# Patient Record
Sex: Female | Born: 1955
Health system: Southern US, Community
[De-identification: ages and names within clinical notes are randomized; demographics above are authoritative.]

## PROBLEM LIST (undated history)

## (undated) DIAGNOSIS — F419 Anxiety disorder, unspecified: Secondary | ICD-10-CM

## (undated) DIAGNOSIS — E785 Hyperlipidemia, unspecified: Secondary | ICD-10-CM

## (undated) DIAGNOSIS — I10 Essential (primary) hypertension: Secondary | ICD-10-CM

## (undated) DIAGNOSIS — K219 Gastro-esophageal reflux disease without esophagitis: Secondary | ICD-10-CM

## (undated) DIAGNOSIS — M1990 Unspecified osteoarthritis, unspecified site: Secondary | ICD-10-CM

## (undated) DIAGNOSIS — E119 Type 2 diabetes mellitus without complications: Secondary | ICD-10-CM

## (undated) HISTORY — DX: Hyperlipidemia, unspecified: E78.5

## (undated) HISTORY — PX: CERVICAL FUSION: SHX112

## (undated) HISTORY — DX: Essential (primary) hypertension: I10

## (undated) HISTORY — DX: Gastro-esophageal reflux disease without esophagitis: K21.9

## (undated) HISTORY — PX: KNEE ARTHROSCOPY: SUR90

## (undated) HISTORY — DX: Unspecified osteoarthritis, unspecified site: M19.90

## (undated) HISTORY — PX: LUMBAR FUSION: SHX111

## (undated) HISTORY — DX: Anxiety disorder, unspecified: F41.9

## (undated) HISTORY — PX: ABDOMINAL HYSTERECTOMY: SHX81

## (undated) HISTORY — DX: Type 2 diabetes mellitus without complications: E11.9

---

## 1998-06-14 ENCOUNTER — Encounter: Payer: Self-pay | Admitting: Neurosurgery

## 1998-06-14 ENCOUNTER — Ambulatory Visit (HOSPITAL_COMMUNITY): Admission: RE | Admit: 1998-06-14 | Discharge: 1998-06-14 | Payer: Self-pay | Admitting: Neurosurgery

## 1998-06-18 ENCOUNTER — Ambulatory Visit (HOSPITAL_COMMUNITY): Admission: RE | Admit: 1998-06-18 | Discharge: 1998-06-18 | Payer: Self-pay | Admitting: Neurosurgery

## 1998-06-25 ENCOUNTER — Encounter: Payer: Self-pay | Admitting: Neurosurgery

## 1998-06-25 ENCOUNTER — Ambulatory Visit (HOSPITAL_COMMUNITY): Admission: RE | Admit: 1998-06-25 | Discharge: 1998-06-25 | Payer: Self-pay | Admitting: Neurosurgery

## 1998-07-09 ENCOUNTER — Encounter: Payer: Self-pay | Admitting: Neurosurgery

## 1998-07-09 ENCOUNTER — Ambulatory Visit (HOSPITAL_COMMUNITY): Admission: RE | Admit: 1998-07-09 | Discharge: 1998-07-09 | Payer: Self-pay | Admitting: Neurosurgery

## 1998-07-23 ENCOUNTER — Ambulatory Visit (HOSPITAL_COMMUNITY): Admission: RE | Admit: 1998-07-23 | Discharge: 1998-07-23 | Payer: Self-pay | Admitting: Neurosurgery

## 1998-07-23 ENCOUNTER — Encounter: Payer: Self-pay | Admitting: Neurosurgery

## 1999-03-06 ENCOUNTER — Encounter: Payer: Self-pay | Admitting: Emergency Medicine

## 1999-03-06 ENCOUNTER — Emergency Department (HOSPITAL_COMMUNITY): Admission: EM | Admit: 1999-03-06 | Discharge: 1999-03-06 | Payer: Self-pay | Admitting: Emergency Medicine

## 2000-02-28 ENCOUNTER — Encounter: Payer: Self-pay | Admitting: Emergency Medicine

## 2000-02-28 ENCOUNTER — Emergency Department (HOSPITAL_COMMUNITY): Admission: EM | Admit: 2000-02-28 | Discharge: 2000-02-28 | Payer: Self-pay

## 2000-03-15 ENCOUNTER — Encounter: Payer: Self-pay | Admitting: Neurosurgery

## 2000-03-15 ENCOUNTER — Ambulatory Visit (HOSPITAL_COMMUNITY): Admission: RE | Admit: 2000-03-15 | Discharge: 2000-03-15 | Payer: Self-pay | Admitting: Neurosurgery

## 2000-04-13 ENCOUNTER — Ambulatory Visit (HOSPITAL_COMMUNITY): Admission: RE | Admit: 2000-04-13 | Discharge: 2000-04-14 | Payer: Self-pay | Admitting: Neurosurgery

## 2000-04-13 ENCOUNTER — Encounter: Payer: Self-pay | Admitting: Neurosurgery

## 2000-06-14 ENCOUNTER — Encounter: Admission: RE | Admit: 2000-06-14 | Discharge: 2000-07-10 | Payer: Self-pay | Admitting: Neurosurgery

## 2000-06-21 ENCOUNTER — Encounter: Admission: RE | Admit: 2000-06-21 | Discharge: 2000-07-10 | Payer: Self-pay | Admitting: Orthopedic Surgery

## 2000-07-21 ENCOUNTER — Encounter: Payer: Self-pay | Admitting: Neurosurgery

## 2000-07-21 ENCOUNTER — Ambulatory Visit (HOSPITAL_COMMUNITY): Admission: RE | Admit: 2000-07-21 | Discharge: 2000-07-21 | Payer: Self-pay | Admitting: Neurosurgery

## 2000-08-10 ENCOUNTER — Encounter: Payer: Self-pay | Admitting: Neurosurgery

## 2000-08-10 ENCOUNTER — Ambulatory Visit (HOSPITAL_COMMUNITY): Admission: RE | Admit: 2000-08-10 | Discharge: 2000-08-10 | Payer: Self-pay | Admitting: Neurosurgery

## 2000-08-24 ENCOUNTER — Encounter: Payer: Self-pay | Admitting: Neurosurgery

## 2000-08-24 ENCOUNTER — Ambulatory Visit (HOSPITAL_COMMUNITY): Admission: RE | Admit: 2000-08-24 | Discharge: 2000-08-24 | Payer: Self-pay | Admitting: Neurosurgery

## 2000-09-06 ENCOUNTER — Encounter: Payer: Self-pay | Admitting: Neurosurgery

## 2000-09-06 ENCOUNTER — Encounter: Admission: RE | Admit: 2000-09-06 | Discharge: 2000-09-06 | Payer: Self-pay | Admitting: Neurosurgery

## 2000-09-07 ENCOUNTER — Encounter: Admission: RE | Admit: 2000-09-07 | Discharge: 2000-09-07 | Payer: Self-pay | Admitting: Neurosurgery

## 2000-09-07 ENCOUNTER — Encounter: Payer: Self-pay | Admitting: Neurosurgery

## 2000-11-13 ENCOUNTER — Ambulatory Visit (HOSPITAL_COMMUNITY): Admission: RE | Admit: 2000-11-13 | Discharge: 2000-11-13 | Payer: Self-pay | Admitting: Neurosurgery

## 2000-11-16 ENCOUNTER — Inpatient Hospital Stay (HOSPITAL_COMMUNITY): Admission: RE | Admit: 2000-11-16 | Discharge: 2000-11-18 | Payer: Self-pay | Admitting: Neurosurgery

## 2000-11-16 ENCOUNTER — Encounter: Payer: Self-pay | Admitting: Neurosurgery

## 2000-12-25 ENCOUNTER — Inpatient Hospital Stay (HOSPITAL_COMMUNITY): Admission: AD | Admit: 2000-12-25 | Discharge: 2000-12-29 | Payer: Self-pay | Admitting: Neurosurgery

## 2000-12-25 ENCOUNTER — Encounter: Payer: Self-pay | Admitting: Neurosurgery

## 2000-12-28 ENCOUNTER — Encounter: Payer: Self-pay | Admitting: Neurosurgery

## 2001-01-04 ENCOUNTER — Encounter: Admission: RE | Admit: 2001-01-04 | Discharge: 2001-01-04 | Payer: Self-pay | Admitting: Neurosurgery

## 2001-01-04 ENCOUNTER — Encounter: Payer: Self-pay | Admitting: Neurosurgery

## 2001-02-08 ENCOUNTER — Encounter: Admission: RE | Admit: 2001-02-08 | Discharge: 2001-02-08 | Payer: Self-pay | Admitting: Neurosurgery

## 2001-02-08 ENCOUNTER — Encounter: Payer: Self-pay | Admitting: Neurosurgery

## 2001-03-07 ENCOUNTER — Encounter: Payer: Self-pay | Admitting: Neurosurgery

## 2001-03-07 ENCOUNTER — Encounter: Admission: RE | Admit: 2001-03-07 | Discharge: 2001-03-07 | Payer: Self-pay | Admitting: Neurosurgery

## 2001-04-18 ENCOUNTER — Encounter: Admission: RE | Admit: 2001-04-18 | Discharge: 2001-04-18 | Payer: Self-pay | Admitting: Neurosurgery

## 2001-04-18 ENCOUNTER — Encounter: Payer: Self-pay | Admitting: Neurosurgery

## 2001-04-19 ENCOUNTER — Encounter: Payer: Self-pay | Admitting: Neurosurgery

## 2001-04-19 ENCOUNTER — Ambulatory Visit (HOSPITAL_COMMUNITY): Admission: RE | Admit: 2001-04-19 | Discharge: 2001-04-19 | Payer: Self-pay | Admitting: Neurosurgery

## 2001-04-26 ENCOUNTER — Encounter: Admission: RE | Admit: 2001-04-26 | Discharge: 2001-04-26 | Payer: Self-pay | Admitting: Neurosurgery

## 2001-04-26 ENCOUNTER — Encounter: Payer: Self-pay | Admitting: Neurosurgery

## 2001-05-17 ENCOUNTER — Encounter: Payer: Self-pay | Admitting: Neurosurgery

## 2001-05-17 ENCOUNTER — Encounter: Admission: RE | Admit: 2001-05-17 | Discharge: 2001-05-17 | Payer: Self-pay | Admitting: Neurosurgery

## 2001-07-19 ENCOUNTER — Encounter: Payer: Self-pay | Admitting: Unknown Physician Specialty

## 2001-07-19 ENCOUNTER — Encounter: Admission: RE | Admit: 2001-07-19 | Discharge: 2001-07-19 | Payer: Self-pay | Admitting: Unknown Physician Specialty

## 2001-12-09 ENCOUNTER — Ambulatory Visit (HOSPITAL_COMMUNITY): Admission: RE | Admit: 2001-12-09 | Discharge: 2001-12-09 | Payer: Self-pay | Admitting: Cardiology

## 2001-12-09 ENCOUNTER — Encounter: Payer: Self-pay | Admitting: Cardiology

## 2002-04-18 ENCOUNTER — Other Ambulatory Visit: Admission: RE | Admit: 2002-04-18 | Discharge: 2002-04-18 | Payer: Self-pay | Admitting: *Deleted

## 2002-04-29 ENCOUNTER — Ambulatory Visit (HOSPITAL_COMMUNITY): Admission: RE | Admit: 2002-04-29 | Discharge: 2002-04-29 | Payer: Self-pay | Admitting: Cardiology

## 2003-02-04 ENCOUNTER — Encounter: Payer: Self-pay | Admitting: Gastroenterology

## 2003-02-04 ENCOUNTER — Ambulatory Visit (HOSPITAL_COMMUNITY): Admission: RE | Admit: 2003-02-04 | Discharge: 2003-02-04 | Payer: Self-pay | Admitting: Gastroenterology

## 2004-04-07 ENCOUNTER — Ambulatory Visit (HOSPITAL_COMMUNITY): Admission: RE | Admit: 2004-04-07 | Discharge: 2004-04-07 | Payer: Self-pay | Admitting: Neurosurgery

## 2004-05-10 ENCOUNTER — Ambulatory Visit (HOSPITAL_COMMUNITY): Admission: RE | Admit: 2004-05-10 | Discharge: 2004-05-11 | Payer: Self-pay | Admitting: Neurosurgery

## 2005-04-05 ENCOUNTER — Ambulatory Visit (HOSPITAL_COMMUNITY): Admission: RE | Admit: 2005-04-05 | Discharge: 2005-04-05 | Payer: Self-pay | Admitting: Cardiology

## 2006-02-27 ENCOUNTER — Ambulatory Visit (HOSPITAL_COMMUNITY): Admission: RE | Admit: 2006-02-27 | Discharge: 2006-02-27 | Payer: Self-pay | Admitting: Neurosurgery

## 2006-04-06 ENCOUNTER — Encounter: Admission: RE | Admit: 2006-04-06 | Discharge: 2006-04-06 | Payer: Self-pay | Admitting: Neurosurgery

## 2006-05-15 ENCOUNTER — Ambulatory Visit (HOSPITAL_COMMUNITY): Admission: RE | Admit: 2006-05-15 | Discharge: 2006-05-16 | Payer: Self-pay | Admitting: Neurosurgery

## 2006-08-27 ENCOUNTER — Ambulatory Visit (HOSPITAL_COMMUNITY): Admission: RE | Admit: 2006-08-27 | Discharge: 2006-08-27 | Payer: Self-pay | Admitting: Neurosurgery

## 2006-11-07 ENCOUNTER — Ambulatory Visit (HOSPITAL_COMMUNITY): Admission: RE | Admit: 2006-11-07 | Discharge: 2006-11-07 | Payer: Self-pay | Admitting: Cardiology

## 2007-06-12 ENCOUNTER — Encounter: Admission: RE | Admit: 2007-06-12 | Discharge: 2007-06-12 | Payer: Self-pay | Admitting: Cardiology

## 2010-10-21 NOTE — Op Note (Signed)
[place]. [facility]  Patient:    [name], [name]                        MRN: [id number] Proc. Date: 12/25/00 Adm. Date:  [id number] Disc. Date: [id number] Attending:  Danella Penton                           Operative Report  PREOPERATIVE DIAGNOSIS:   Lower back pain, chronic with bilateral L5-S1 radiculopathy, degenerative disk disease L5-S1.  POSTOPERATIVE DIAGNOSIS:  Lower back pain, chronic with bilateral L5-S1 radiculopathy, degenerative disk disease L5-S1.  OPERATION:  Bilateral L5-S1 diskectomy, interbody fusion using Tangent bone graft.  Posterior lateral fusion L5-S1.  Pedicle screw L5-S1.  SURGEON:  Tanya Nones. Jeral Fruit, M.D.  ASSISTANTMena Goes. Franky Macho, M.D.  INDICATIONS:  Ms. Castonguay is a lady who underwent L5-S1 diskectomy several months ago.  The patient, nevertheless although she did well for awhile, continued to have back pain with radiation down to both legs.  Conservative treatment had been a failure.  X-rays showed that indeed she has abnormal disk space between L5-S1 with bilateral foraminal stenosis which is giving her the problem.  Surgery was advised in view of the failure of conservative treatment.  The patient was to have surgery several weeks ago but she came with an acute herniated disk in the cervical area which had to be taken care surgically first.  The patient knew of the risks of surgery.  DESCRIPTION OF PROCEDURE:  The patient was taken to the operating room.  She was positioned in a prone position.  A midline incision resecting the previous one was made.  Muscle and fascia were retracted laterally.  We identified the area of surgery at the L5-S1 and it was easy to identify the L5 on the right side.  Retractor was inserted.  We decided to preserve the spinal stenosis. With the drill, we drilled the lamina of L5 and the upper part of S1 bilaterally.  There was quite a bit of scar tissue on the left side affecting not  only the S1 but also the L5 nerve root.  Lysis was accomplished, and good foraminotomy was done.  We went laterally until we were able to remove a little bit of the facet.  Then we identified the disk space.  There was quite a bit of scar between the dural sac and the disk space.  Lysis was accomplished, and we entered the disk space.  Using the microcurette we were able to do a total diskectomy.  The same procedure was done on the right side. Then we inserted the retractor at the level of L5-S1 and started with the 6 mm going all the way up to 10 mm.  We removed the end-plates bilaterally.  At the end, we were able to insert #26 allograft bilaterally.  In between both and the center of the disk we used a mixture of allograft and Grafton.  All of this procedure was done using the C-arm.  Then we went laterally until we identified the L5-S1 area.  We drilled the alla of the sacrum as well as the transverse process of L5.  Then we had a mixture of blood plus Vitross which was inserted and applied laterally at both levels, right and left.  Having done this, we identified the pedicles of L4 and pedicles of S1.  Using a probe, with the help of the  C-arm we followed the insertion of the screw at those four levels.  AP and lateral showed good position of the screws. Investigation showed that there was plenty of space for the L5 and S1 nerve root.  Then a rod was inserted between the pedicle screws and compression was achieved.  Then we had a good picture of the AP and lateral.  Having done this, the area was irrigated.  Fentanyl was left in the epidural space.  Flap was also used to cover the dura mater.  From then on, the wound was closed with Vicryl and nylon.  The patient did well. DD:  12/25/00 TD:  12/26/00 Job: [id number]

## 2010-10-21 NOTE — H&P (Signed)
[place]. [facility]  Patient:    [name], [name]                        MRN: [id number] Adm. Date:  [id number] Disc. Date: [id number] Attending:  Danella Penton                         History and Physical  HISTORY OF PRESENT ILLNESS:  Mrs. Winbush is a lady who was admitted three days ago for lumbar fusion.  Nevertheless, when I was talking to her, she was telling me that for two weeks, she had been complaining of neck pain with radiation to the left upper extremity.  Because the pain in the arm was worse than the lower back, we decided to cancel the surgery until we could find out what was going on with the left arm.  Indeed, she is telling me that she has neck pain with radiation to the left arm, tingling sensation and weakness and she is miserable.  She feels that the left arm is getting worse, to the point that she cannot sleep at nighttime.  She had an MRI done at [facility] which was read as negative.  Nevertheless, I brought this lady to my office yesterday and I looked at the MRI and after my physical examination, I called the x-ray department of [facility] and indeed, they reviewed the MRI and they agreed with me that indeed she has a herniated disk at the level of C6-C7; this is the main reason why she is being admitted today for a cervical diskectomy.  PAST MEDICAL HISTORY:  Knee surgery, hysterectomy and she also has a lumbar disk surgery.  FAMILY HISTORY:  Unremarkable.  REVIEW OF SYSTEMS:  Patient has been having not only the neck and left upper extremity pain but also back and bilateral leg pain.  She has degenerative disk disease and she is being scheduled later on for a lumbar fusion.  PHYSICAL EXAMINATION:  GENERAL:  Patient came to my office yesterday and she was quite miserable with the pain in the back and also in the left arm.  The only way she was able to relieve the pain was putting the left hand on top of the  head.  HEENT:  Normal.  NECK:  She is able to flex but extension and lateralization are quite painful. There are no bruits in the carotids.  LUNGS:  Mild rhonchi.  HEART:  Heart sounds normal.  ABDOMEN:  Normal.  EXTREMITIES:  Normal pulses.  NEUROLOGIC:  Mental status normal.  Cranial nerves normal.  Strength:  She has weakness in dorsiflexors bilaterally, left worse than the right one.  In the left upper extremity, she has weakness of the left triceps.  The biceps is normal and so is the deltoid.  The right upper extremity is normal.  Reflexes are symmetrical with decrease of the left triceps; the lower extremities are 1+.  Sensation:  She complains of numbness which involves mostly the index and middle fingers and also numbness in the dorsum of the left foot.  Coordination and gait normal.  CLINICAL IMPRESSION: 1. Left C6-7 herniated disk. 2. Degenerative disk disease with a recurrent herniated disk at the level of    L4-5.  RECOMMENDATIONS:  The patient is going to have a posterior diskectomy and hopefully in two weeks, we can proceed with the lumbar fusion.  The risks associated with the cervical  diskectomy would be air emboli, infection, no improvement whatsoever, need for further surgery which might require an anterior approach, also the possibility of infection. DD:  11/16/00 TD:  11/16/00 Job: [id number]

## 2010-10-21 NOTE — Discharge Summary (Signed)
[place]. [facility]  Patient:    [name], [name]                        MRN: [id number] Adm. Date:  [id number] Disc. Date: [id number] Attending:  Danella Penton                           Discharge Summary  ADMITTING DIAGNOSIS:  Lumbar spondylosis and stenosis with lumbar radiculopathy L5-S1.  DISCHARGE DIAGNOSIS:  Lumbar spondylosis and stenosis with lumbar radiculopathy L5-S1.  OPERATION:  Bilateral L5-S1 diskectomy, foraminotomy, facetectomy, and interbody arthrodesis with pedicular screw fixation and posterolateral arthrodesis by Dr. Hilda Lias on December 25, 2000.  CONDITION ON DISCHARGE:  Improving.  HOSPITAL COURSE:  The patient is a 55 year old individual who has had significant back and bilateral leg pain secondary to a markedly degenerated disk at the L5-S1 level.  She failed extensive efforts at conservative management and was advised regarding surgical decompression and stabilization. This procedure was completed on December 25, 2000.  Postoperatively, the patient was slow to mobilize, required PCA medication for the first 48 hours postoperatively, gradually was mobilized and changed to oral Percocet.  Her pain has been well controlled, her incision has been clean and dry, and she has been proceeding with most activities of daily living fairly independently. At this time she is discharged home with a prescription for Percocet 5 #60 and Ativan 1 mg #30.  She will be seen in the office in three weeks time for further follow-up. DD:  12/29/00 TD:  12/31/00 Job: [id number]

## 2010-10-21 NOTE — H&P (Signed)
[place]. [facility]  Patient:    [name], [name]                        MRN: [id number] Adm. Date:  [id number] Attending:  Danella Penton                         History and Physical  HISTORY:  [name] is a lady who was seen by me initially more than two years ago because she was having back pain with radiation down to the left leg.  The patient has had conservative treatment.  She did well, but so far this year I have seen her on two occasions, because the pain was getting worse, with weakness, numbness, and limping from the left leg.  We did and MRI, and because of the findings, she is being admitted for surgery.  PAST SURGICAL HISTORY:  Hysterectomy.  ALLERGIES:  No known drug allergies.  SOCIAL HISTORY:  Does not smoke.  Does not drink.  FAMILY HISTORY:  Mother has congestive heart failure and arthritis.  REVIEW OF SYSTEMS:  She is complaining of some knee pain which she has before.  PHYSICAL EXAMINATION:  HEENT:  Normal.  NECK:  Normal.  LUNGS:  Clear.  HEART:  Sounds normal.  ABDOMEN:  Normal.  EXTREMITIES:  Normal pulses.  There is scar from previous surgery.  NEUROLOGIC:  Mental status normal.  Cranial nerves normal.  Strength:  She has weakness of plantar flexion, although the sensation is completely normal.  She complains of some tingling sensation, mostly in the left leg down to the left foot.  Straight leg raising on the right side is negative at 90 degrees, on the left is positive at 70 degrees.  She has decreased flexion in the lumbar flexion.  Reflexes 2+, with no Babinski.  The patient had previously a lumbar myelogram about one year ago which showed some degenerative changes at the level of L5-S1, but the repeat MRI which was done a few weeks ago showed that indeed she has a foraminal stenosis which is worse at the level of L5-S1, with the possibility of a lateral herniated disk. There are also some degenerative  changes at that level.  CLINICAL IMPRESSION: 1. Left L5-S1 foraminal stenosis with the possibility of a herniated disk. 2. Degenerative disk disease.  RECOMMENDATIONS:  The patient is being admitted for surgery.  She feels that she does not want to continue with the conservative treatment.  The procedure will be a left L5-S1 diskectomy and foraminotomy.  She knows about the risks, such as infection, CSF leak, worsening of the pain, paralysis, need for further surgery. DD:  04/13/00 TD:  04/13/00 Job: [id number]

## 2010-10-21 NOTE — Op Note (Signed)
[name], [name]                 ACCOUNT NO.:  [id number]   MEDICAL RECORD NO.:  [id number]          PATIENT TYPE:  OIB   LOCATION:  3008                         FACILITY:  [facility]   PHYSICIAN:  Hilda Lias, M.D.   DATE OF BIRTH:  February 09, 1956   DATE OF PROCEDURE:  05/10/2004  DATE OF DISCHARGE:                                 OPERATIVE REPORT   PREOPERATIVE DIAGNOSIS:  C6-C7 herniated disk, central to the left.   POSTOPERATIVE DIAGNOSIS:  C6-C7 herniated disk, central to the left.   OPERATION PERFORMED:  Anterior C6-C7 diskectomy.  Decompression of the  spinal cord, bilateral foraminotomy.  Removal of the large fragment to the  left side.  Interbody fusion and allograft, plate, microscope.   SURGEON:  Hilda Lias, M.D.   ANESTHESIA:  General.   INDICATIONS FOR PROCEDURE:  The patient is being admitted because of neck  and left upper extremity pain.  X-ray showed herniated disk central to the  left.  She has failed with conservative treatment. The risks were explained  in the history and physical.   DESCRIPTION OF PROCEDURE:  The patient was taken to the operating room and  the left side of the neck was prepped with Betadine.  Transverse incision  was made through the skin and platysma down to the cervical spine.  We found  large anterior osteophyte and the x-ray proved that was the level of the  left C6-C7.  The osteophyte was removed.  We brought the microscope into the  area.  Using the microcurette we did diskectomy with removal of degenerative  disk.  We opened the posterior ligament and to the left side we found a  large osteophyte with a free piece of fragment compromising the C7 nerve  root.  Decompression of the C7 nerve root bilateral was accomplished.  At  the end we had good decompression of not only the nerve but also the spinal  cord. The end plate was drilled and lordotic 7 mm bone graft was inserted  followed by a plate using four screws.  We had to readjust  the plate and  screw on two occasions because the bones were really soft.  At the end, the  x-ray showed good position of the bone graft and the plate.  From then on  the area was irrigated.  Investigation of the esophagus and trachea were  negative.  Hemostasis was done with bipolar and the wound was closed with  Vicryl and Steri-Strips.       EB/MEDQ  D:  05/10/2004  T:  05/10/2004  Job:  [id number]

## 2010-10-21 NOTE — H&P (Signed)
NAMEGALE, KLAR                 ACCOUNT NO.:  [id number]   MEDICAL RECORD NO.:  [id number]          PATIENT TYPE:  OIB   LOCATION:  NA                           FACILITY:  [facility]   PHYSICIAN:  Hilda Lias, M.D.   DATE OF BIRTH:  1956/01/06   DATE OF ADMISSION:  05/10/2004  DATE OF DISCHARGE:                                HISTORY & PHYSICAL   HISTORY OF PRESENT ILLNESS:  Mrs. Lawler is a lady who came to see my office  because of neck pain with radiation to the left upper extremity and severe  weakness of the triceps.  The patient has failed with conservative  treatment.  Because of that, we proceeded with a radiological workup that  indeed found that she has a herniated disk at the level of C6-C7.  Because  she is not any better, she wants to proceed with surgery.   PAST MEDICAL HISTORY:  She has had a hysterectomy and also back surgery in  2001.   SOCIAL HISTORY:  Social history is negative.   ALLERGIES:  She is not allergic to any medications.   REVIEW OF SYSTEMS:  Review of systems is positive for neck pain and left  upper extremity pain.  She also has some abnormal back pain.   PHYSICAL EXAMINATION:  HEENT:  Head, eyes, ears, nose and throat normal.  NECK:  She is able to flex, but extension and lateralization produce  discomfort going to the left shoulder.  LUNGS:  Clear.  HEART:  Heart sounds normal.  ABDOMEN:  Normal.  EXTREMITIES:  Normal pulses.  BACK:  There is a scar in the lumbar area from previous surgery.  NEUROLOGIC:  Mental status and cranial nerves normal.  Strength showed that  she had weakness of the left triceps.  There is no atrophy.  Reflexes are  symmetrical with decrease of the left triceps.  Coordination and gait  normal.   IMAGING STUDIES:  The cervical x-ray showed that she has a herniated disk at  the level of C6-C7, central and to the left.   CLINICAL IMPRESSION:  C6-C7 herniated disk.   RECOMMENDATION:  The patient is being admitted for  surgery.  The procedure  will be 1-level anterior cervical diskectomy with bone graft and a plate.  The risks were fully explained to her in my office which include paralysis,  weakness, stroke, damage to the vocal cords, damage to the esophagus, no  __________, need for further surgery.       EB/MEDQ  D:  05/10/2004  T:  05/10/2004  Job:  [id number]

## 2010-10-21 NOTE — Cardiovascular Report (Signed)
[name], [name]                           ACCOUNT NO.:  [id number]   MEDICAL RECORD NO.:  [id number]                   PATIENT TYPE:  OIB   LOCATION:  2873                                 FACILITY:  [facility]   PHYSICIAN:  Mohan N. Sharyn Lull, M.D.              DATE OF BIRTH:  1955/09/15   DATE OF PROCEDURE:  04/29/2002  DATE OF DISCHARGE:                              CARDIAC CATHETERIZATION   PROCEDURE:  Left cardiac catheterization with selective left and right  coronary angiography, left ventriculography via the right groin using  Judkins technique.   INDICATIONS FOR PROCEDURE:  The patient is a 55 year old white female with a  past medical history significant for hypertension and hypercholesterolemia,  morbid obesity, positive family history of coronary artery disease,  postmenopausal, history of depression, cervical and lumbar radiculopathy  complains of recurrent retrosternal chest heaviness, described as a dull  aching pain off and on for the last three weeks lasting 3 minutes, relieved  with rest associated with occasional palpitations.  Denies an  lightheadedness or syncope.  Denies PND, orthopnea, leg swelling.  The  patient also gives history of exertional dyspnea with minimal exertion.  The  patient underwent Persantine Cardiolite in July 2003, which was negative for  reversible ischemia but as patient continues to have recurrent chest pain  and multiple risk factors discussed with patient various options of  treatment and consented for PCI.   PAST MEDICAL HISTORY:  As above.   PAST SURGICAL HISTORY:  She had back surgery x3 in the past.  She had  cervical fusion in June of 2002.  Had left knee surgery in the past and  hysterectomy in 1975.   SOCIAL HISTORY:  She is married.  No history of smoking or alcohol abuse.  Patient presently on disability. She worked _______________.   FAMILY HISTORY:  Father died of lung cancer.  Mother is alive, she had MI at  the age of  41.  She also had history of congestive heart failure.  One  brother had MI in his 49s.  Another brother had MI at the age of 35. Two  sister's are hypertensive.   HOME MEDICATIONS:  1. Atenolol 50 mg p.o. daily.  2. Hydrochlorothiazide 25 mg p.o. daily.  3. Enteric-coated aspirin 1 tablet daily.  4. Vioxx 25 mg p.o. daily.  5. Amitriptyline 100 mg p.o. h.s.  6. Neurontin 300 mg p.o. b.i.d.  7. Nexium 40 mg p.o. daily  8. Nitrostat sublingual p.r.n.   PHYSICAL EXAMINATION:  GENERAL:  On examination, She is alert, awake,  oriented x3 in no acute distress.  VITAL SIGNS:  Blood pressure is 130/90, pulse was 78, regular.  HEENT:  Conjunctiva pink.  NECK:  Supple, no JVD, no bruit.  LUNGS:  Clear to auscultation without rhonchi or rales.  CARDIOVASCULAR:  S1 and S2 was normal.  There is no S3 gallop.  ABDOMEN:  Soft,  bowel sounds are present, nontender.  EXTREMITY:  There is no clubbing, cyanosis or edema.   IMPRESSION:  Recurrent chest pain.  Rule out coronary insufficiency,  uncontrolled hypertension, hypercholesterolemia, morbid obesity,  postmenopausal, positive family history of coronary artery disease.   Discussed with patient various options of treatment, i.e., medical versus  invasive, left catheterization, possible PTCA with stenting, its risks,  i.e., death, MI, stroke, need for emergency CABG, risk of restenosis, local  vascular complications, etc., and consented for PCI.   DESCRIPTION OF PROCEDURE:  After obtaining the informed consent, the patient  was brought to the catheterization lab and was placed on the fluoroscopy  table.  The right groin was prepped and draped in the usual fashion.  Xylocaine 2% was used for local anesthesia in the right groin.  With the  help of a thin wall needle 6 French arterial sheath was placed.  The sheath  was aspirated and flushed.  Next, a 6 French left Judkins catheter was  advanced over the wire under fluoroscopic guidance up to the  ascending  aorta.  The wire was pulled out, the catheter was aspirated and connected to  the manifold.  The catheter was further engaged into left coronary ostium.  Multiple views of the left system were taken. Next, the catheter was  disengaged and was pulled out over the wire and was replaced with 6 French  right Judkins catheter which was advanced over the wire under fluoroscopic  guidance up to the ascending aorta.  The wire was pulled out, the catheter  was aspirated and connected to the manifold.  The catheter was further  advanced and engaged into the right coronary ostium.  Multiple views of the  right system were taken.  Next, the catheter was disengaged and was pulled  out over the wire and was replaced with 6 French pigtail catheter which was  advanced over the wire under fluoroscopic guidance up to the ascending  aorta.  The wire was pulled out, the catheter was aspirated and connected to  the manifold.  The catheter was further advanced across the aortic valve  into the LV.  LV pressures were recorded.  Next, LV graph was done in 30-  degree RAO position.  Postangiographic pressures were recorded from LV and  then pullback pressures were recorded from the aorta.  There was no gradient  across the aortic valve.  Next, the pigtail catheter was pulled out over the  wire.  The sheaths were aspirated and flushed.   FINDINGS:  LV showed good LV systolic function, EF of 55-60%.   Left main was long, which was patent.   LAD was patent in the proximal and midportion and was diffusely diseased  distally at the apex.  Diagonal #1 and diagonal #2 are small which are  patent.  Diagonal #3 is very small, which is patent.  The ramus is very small, less than 1 mm which is diffusely diseased.   Left circumflex is small which is patent.  OM-1 and OM-2 are very small  which are patent.   RCA is patent.  The patient tolerated the procedure well.  There were no complications.  The  patient  was transferred to recovery room in stable condition.  Eduardo Osier. Sharyn Lull, M.D.   MNH/MEDQ  D:  04/29/2002  T:  04/29/2002  Job:  [id number]   cc:   Cardiac Catheterization Laboratory

## 2010-10-21 NOTE — Op Note (Signed)
[place]. [facility]  Patient:    [name], [name]                        MRN: [id number] Proc. Date: 04/13/00 Adm. Date:  [id number] Disc. Date: [id number] Attending:  Danella Penton                           Operative Report  PREOPERATIVE DIAGNOSES:  Left L5-S1 herniated disk laterally with degenerative disk disease ______ .  POSTOPERATIVE DIAGNOSES:  Left L5-S1 herniated disk laterally with degenerative disk disease ______ .  PROCEDURE:  Left L5-S1 diskectomy, microdissection, foraminotomy, microscope.  SURGEON:  Tanya Nones. Jeral Fruit, M.D.  ASSISTANT:  ______ .  BRIEF HISTORY OF PRESENT ILLNESS:   Ms. Auten is a lady who I have been following for 3 years complaining of back and left leg pain. Lately, the pain is getting worse, and she has failed conservative treatment. MRI showed a herniated disk at L5-S1 laterally with degenerative disk disease. Surgery was advised. The patient knew all of the risks, such as infection, CSF leak, worsening pain, paralysis, ______ .  DESCRIPTION OF PROCEDURE:  The patient was taken to the OR. ______ . The back was prepped with Betadine. A midline incision from L5-S1 was made. Bursa was retracted laterally. X-rays showed a ______ at the L5-S1. Then, we brought the microscope into the area. We started our dissection by removing the ______ . Then we found out indeed the nerve root was swollen. We displayed the S1 nerve root and indeed there was a lateral herniated disk. Although there was some component going medially, mostly was going ______ . Incision was made and total gross diskectomy with removal of a large degenerative disk medially and laterally was accomplished. Foraminotomy was done. At the end, we had plenty of room for the S1 and L5 nerve root. Valsalva maneuver was negative. Then the area was irrigated. Fentanyl and ______ were left in the epidural space, as well as ______ . Then the wound was closed with  Vicryl and sterile dressing. The patient did well. DD:  04/13/00 TD:  04/14/00 Job: [id number]

## 2010-10-21 NOTE — Op Note (Signed)
[name], [name]                 ACCOUNT NO.:  [id number]   MEDICAL RECORD NO.:  [id number]          PATIENT TYPE:  OIB   LOCATION:  3003                         FACILITY:  [facility]   PHYSICIAN:  Hilda Lias, M.D.   DATE OF BIRTH:  [birth year]   DATE OF PROCEDURE:  05/15/2006  DATE OF DISCHARGE:                               OPERATIVE REPORT   PREOPERATIVE DIAGNOSES:  Left L3-L4 herniated disk with a foraminal  component, status post fusion at 5-1.   POSTOPERATIVE DIAGNOSES:  Left L3-L4 herniated disk with a foraminal  component, status post fusion at 5-1.   PROCEDURE:  Left L3-L4 diskectomy, decompression of L3 and L4 nerve  roots, microscope.   SURGEON:  Hilda Lias, M.D.   ASSISTANT:  Clydene Fake, M.D.   CLINICAL HISTORY:  The patient in the past underwent surgery at the  level of L5-1.  The patient did well, but later she had been complaining  of pain down to the left leg, mostly into the quadriceps.  She had  failed conservative treatment, including epidural injections.  On x-  rays, she has a herniated disk, mostly into the foramen, but it was also  extraforaminal.  Surgery was advised and the risks were explained at the  History and Physical.   DESCRIPTION OF PROCEDURE:  The patient was taken to the OR and she was  positioned in a prone manner.  The back was prepped with DuraPrep.  X-  rays showed that we were at the level of L4.  From then, on, we did an  incision on the left side from 3 to 4, up.  The area was quite tight and  we ended up removing the laminae, part of L3 and the L4.  We brought the  microscope into the area.  The patient had quite a bit of adhesions with  quite an amount of epidural veins.  The patient had plaques of Depo-  Medrol from the previous injection.  Lysis was accomplished.  We did an  x-ray and the probe was at the level of L2-L3.  From then on, we worked  our way down.  We identified the L3-L4 disk space.  Lysis of adhesions  was  accomplished.  We were able to retract the L4 nerve root.  The L3,  when we tried to put a probe into the foramen, was quite tight.  With  the knife, we opened the disk space using the Scoville curet, and we did  removal of disk medially and laterally.  Most of the disk was quite  degenerative in the lateral aspect.  Using the pituitary rongeurs, total  diskectomy was accomplished.  At the end, we had a good decompression of  the L4, as well as both the L3 nerve roots.  There was plenty of space.  Because of that, we decided not to work in the foramina.  From then on,  the area was irrigated.  Valsalva maneuver was made.  The wound was  closed with Vicryl and Steri-Strips.           ______________________________  Hilda Lias, M.D.     EB/MEDQ  D:  05/15/2006  T:  05/16/2006  Job:  [id number]   cc:   Clydene Fake, M.D.

## 2010-10-21 NOTE — H&P (Signed)
NAMEMIKEA, QUADROS                 ACCOUNT NO.:  [id number]   MEDICAL RECORD NO.:  [id number]          PATIENT TYPE:  OIB   LOCATION:  3003                         FACILITY:  [facility]   PHYSICIAN:  Hilda Lias, M.D.   DATE OF BIRTH:  11/22/1955   DATE OF ADMISSION:  05/15/2006  DATE OF DISCHARGE:                              HISTORY & PHYSICAL   HISTORY OF PRESENT ILLNESS:  Mrs. Spiers is a lady who came to see me  because of back pain radiating down to the left leg, down to the knee,  but not below the knee.  She has developed  white spots and she had  difficulty walking.  The patient denies any pain in the right leg.  In  the past, she had a fusion of the L5-1, which shows a good solid fusion.  Because of no improvement we did a myelogram and because the L5 disk is  herniated, she is admitted for surgery.   PAST MEDICAL HISTORY:  Hysterectomy, back fusion in 2001.   SOCIAL HISTORY:  Negative.   ALLERGIES:  SHE NOT ALLERGIC TO ANY MEDICATIONS.   REVIEW OF SYSTEMS:  Positive back and left leg pain.   PHYSICAL EXAMINATION:  GENERAL:  The patient came to my office limping  on the left leg.  HEAD:  No evidence of trauma.  NECK:  Normal.  LUNGS:  Clear.  HEART:  Sounds normal.  EXTREMITIES:  Normal tibial and femoral pulses.  NEUROLOGIC:  She has weakness of the left iliopsoas.  The femoral  stretch maneuver is positive on the left side.  She has numbness, which  involves most of the L3 and L4 nerve root.   The lumbar MRI, x-ray, as well as the __________  showed that she has  fusion of the L5-1, which is solid.  There is a herniated disk at the L3-  4 neural foramen.   IMPRESSION:  Left L3-4 herniated disk.   RECOMMENDATIONS:  The patient is being admitted for surgical procedure,  being a L3-4 diskectomy.  She knows all the risks of infection, CSF  leak, postoperative pain, damage to the abdomen __________ , need of  further surgery, no improvement whatsoever.     ______________________________  Hilda Lias, M.D.     EB/MEDQ  D:  05/15/2006  T:  05/16/2006  Job:  [id number]

## 2010-10-21 NOTE — Op Note (Signed)
[place]  Patient:    [name], [name]                          MRN: [id number] Proc. Date: 11/16/00 Adm. Date:  11/16/00 Attending:  Tanya Nones. Jeral Fruit, M.D.                           Operative Report  PREOPERATIVE DIAGNOSIS:  Left C6-C7 foraminal herniated disk.  POSTOPERATIVE DIAGNOSIS:  Left C6-7 foraminal herniated disk.  PROCEDURE:  Left C6-7 foraminotomy, removal of fragment using the Medrix system microscope.  SURGEON:  Tanya Nones. Jeral Fruit, M.D.  ASSISTANTMena Goes. Franky Macho, M.D.  CLINICAL HISTORY:  Mrs. Weyandt is a lady who was to be admitted this week for lumbar fusion, but when I saw her she was complaining of neck pain with radiation to the left upper extremity.  MRI showed that indeed she has a herniated disk at level of C6 and C7.  Surgery was advised.  Once she is better from her neck we will be able to procedure with the lumbar fusion.  PROCEDURE IN DETAIL:  The patient was taken to the operating room and after intubation she was positioned in a sitting manner using 3 pins.  The neck was prepped with Betadine.  Using the C-arm we identified the C6-C7 space and went about an inch away from the midline.  We made a skin incision and using the dilators we went all the way down to the facet joint of C6-7.  Then the muscles were retracted and coagulated.   We had a good view of the laminae of C6, C7 and the facet.  We brought the microscope into the area and using the drill we drilled part of the lamina of C6 and C7, and part of the medial facet.  The yellow ligament was also excised.  Then we investigated the C7 nerve root.  Indeed right at the level of the axilla, and going laterally, there was a small herniated disk compromising the C7 nerve root.  An incision was made and a large piece of fragment, a single one, was removed.  Investigation above and below was negative.  At the end we had decompression of the C7 nerve root  with good foraminotomy.  The area was irrigated and the wound was closed using Vicryl and Steri-Strips.  The patient did well and went to the recovery room. DD:  11/16/00 TD:  11/16/00 Job: [id number]

## 2010-10-21 NOTE — Discharge Summary (Signed)
[place]  Patient:    [name], [name]                          MRN: [id number] Adm. Date:  11/13/00 Disc. Date: 11/13/00 Attending:  Tanya Nones. Jeral Fruit, M.D.                           Discharge Summary  HISTORY OF PRESENT ILLNESS:  Ms. Shands is a lady who was to be admitted today for an L5-S1 diskectomy followed by pedicle screw decompression, posterolateral fusion, and interbody fusion.  Nevertheless, right now, when I came to see her, she has been complaining of awful pain in her leg going to the left upper extremity.  She told me that right now the arm pain is getting worse than the lower back and it seems that her physician last week ordered an MRI at [facility], which was done over the weekend, but unfortunately we were not aware of this situation until right now.  It is 7:30 in the morning on June 11.  Upon talking to her, she was undecided about surgery because it seems that the left arm pain is getting worse than the lower back pain.  I brought her husband and the idea we agreed on, since she is having more pain in the left arm, it would be wise to wait and see what the MRI of the neck will show before we go ahead with the lumbar decompression.  The patient right now in front of me and her husband is insecure about going ahead with surgery because it seems that having back surgery ______ that she is going to be miserable with the left arm pain.  Because of that, we are in full agreement with her, her husband and I, and we decided to postpone surgery until we find out what is going on with the cervical spine. DD:  11/13/00 TD:  11/13/00 Job: [id number]

## 2011-06-09 ENCOUNTER — Other Ambulatory Visit: Payer: Self-pay | Admitting: Cardiology

## 2011-07-03 ENCOUNTER — Other Ambulatory Visit: Payer: Self-pay | Admitting: Cardiology

## 2011-07-27 ENCOUNTER — Other Ambulatory Visit: Payer: Self-pay | Admitting: Cardiology

## 2011-10-13 ENCOUNTER — Other Ambulatory Visit: Payer: Self-pay | Admitting: Cardiology

## 2011-11-22 ENCOUNTER — Other Ambulatory Visit: Payer: Self-pay | Admitting: Cardiology

## 2011-12-04 ENCOUNTER — Other Ambulatory Visit: Payer: Self-pay | Admitting: Cardiology

## 2015-10-01 ENCOUNTER — Other Ambulatory Visit: Payer: Self-pay | Admitting: Cardiology

## 2015-10-01 DIAGNOSIS — R079 Chest pain, unspecified: Secondary | ICD-10-CM

## 2015-10-06 ENCOUNTER — Ambulatory Visit (HOSPITAL_COMMUNITY)
Admission: RE | Admit: 2015-10-06 | Discharge: 2015-10-06 | Disposition: A | Discharge status: Home / Self Care | Payer: Self-pay | Source: Ambulatory Visit | Attending: Cardiology | Admitting: Cardiology

## 2015-10-06 ENCOUNTER — Encounter (HOSPITAL_COMMUNITY)
Admission: RE | Admit: 2015-10-06 | Discharge: 2015-10-06 | Disposition: A | Discharge status: Home / Self Care | Payer: BLUE CROSS/BLUE SHIELD | Source: Ambulatory Visit | Attending: Cardiology | Admitting: Cardiology

## 2015-10-06 DIAGNOSIS — R079 Chest pain, unspecified: Secondary | ICD-10-CM | POA: Diagnosis present

## 2015-10-06 DIAGNOSIS — R931 Abnormal findings on diagnostic imaging of heart and coronary circulation: Secondary | ICD-10-CM | POA: Diagnosis not present

## 2015-10-06 MED ORDER — TECHNETIUM TC 99M SESTAMIBI - CARDIOLITE
30.0000 | Freq: Once | INTRAVENOUS | Status: AC | PRN
  Administered 2015-10-06: 30 via INTRAVENOUS

## 2015-10-06 MED ORDER — TECHNETIUM TC 99M SESTAMIBI GENERIC - CARDIOLITE
10.0000 | Freq: Once | INTRAVENOUS | Status: AC | PRN
  Administered 2015-10-06: 10 via INTRAVENOUS

## 2015-10-06 MED ORDER — REGADENOSON 0.4 MG/5ML IV SOLN
0.4000 mg | Freq: Once | INTRAVENOUS | Status: AC
  Administered 2015-10-06: 0.4 mg via INTRAVENOUS

## 2015-10-06 MED ORDER — REGADENOSON 0.4 MG/5ML IV SOLN
INTRAVENOUS | Status: AC
  Administered 2015-10-06: 0.4 mg via INTRAVENOUS
  Filled 2015-10-06: qty 5

## 2016-04-04 ENCOUNTER — Ambulatory Visit (INDEPENDENT_AMBULATORY_CARE_PROVIDER_SITE_OTHER): Payer: BLUE CROSS/BLUE SHIELD | Admitting: Physician Assistant

## 2016-04-04 ENCOUNTER — Encounter: Payer: Self-pay | Admitting: Physician Assistant

## 2016-04-04 VITALS — BP 112/72 | HR 87 | Temp 97.6°F | Ht 66.0 in | Wt 185.0 lb

## 2016-04-04 DIAGNOSIS — M4727 Other spondylosis with radiculopathy, lumbosacral region: Secondary | ICD-10-CM | POA: Diagnosis not present

## 2016-04-04 DIAGNOSIS — I152 Hypertension secondary to endocrine disorders: Secondary | ICD-10-CM | POA: Insufficient documentation

## 2016-04-04 DIAGNOSIS — M5136 Other intervertebral disc degeneration, lumbar region: Secondary | ICD-10-CM | POA: Insufficient documentation

## 2016-04-04 DIAGNOSIS — M51369 Other intervertebral disc degeneration, lumbar region without mention of lumbar back pain or lower extremity pain: Secondary | ICD-10-CM

## 2016-04-04 DIAGNOSIS — K219 Gastro-esophageal reflux disease without esophagitis: Secondary | ICD-10-CM | POA: Diagnosis not present

## 2016-04-04 DIAGNOSIS — I209 Angina pectoris, unspecified: Secondary | ICD-10-CM | POA: Diagnosis not present

## 2016-04-04 DIAGNOSIS — I10 Essential (primary) hypertension: Secondary | ICD-10-CM

## 2016-04-04 DIAGNOSIS — G43909 Migraine, unspecified, not intractable, without status migrainosus: Secondary | ICD-10-CM | POA: Insufficient documentation

## 2016-04-04 DIAGNOSIS — I25118 Atherosclerotic heart disease of native coronary artery with other forms of angina pectoris: Secondary | ICD-10-CM | POA: Diagnosis not present

## 2016-04-04 DIAGNOSIS — G43809 Other migraine, not intractable, without status migrainosus: Secondary | ICD-10-CM | POA: Diagnosis not present

## 2016-04-04 MED ORDER — OXYCODONE HCL ER 40 MG PO T12A: 40.0000 mg | EXTENDED_RELEASE_TABLET | Freq: Two times a day (BID) | ORAL | 0 refills | Status: DC

## 2016-04-04 MED ORDER — HYLATOPIC PLUS EX CREA: 1.0000 "application " | TOPICAL_CREAM | Freq: Two times a day (BID) | CUTANEOUS | 11 refills | Status: DC

## 2016-04-04 NOTE — Patient Instructions (Signed)

## 2016-04-04 NOTE — Progress Notes (Signed)
BP 112/72   Pulse 87   Temp 97.6 F (36.4 C) (Oral)   Ht 5\' 6"  (1.676 m)   Wt 185 lb (83.9 kg)   BMI 29.86 kg/m    Subjective:    Patient ID: [name], female    DOB: 11/21/1955, 60 y.o.   MRN: [id number]  [name] is a 60 y.o. female presenting on 04/04/2016 for 3 month followup  HPI [place] Controlled Substance Abuse Database reviewed- Yes If yes- were their any concerning findings : no Depression screen PHQ 2/9 04/04/2016  Decreased Interest 0  Down, Depressed, Hopeless 0  PHQ - 2 Score 0     [place] drug screen performed- No  SOAPP 0= never  1= seldom  2=sometimes  3= often  4= very often  1. How often do you have mood swings? 0 2. How often do you smoke a cigarette within an hour after waling up? 0 3. How often have you taken medication other than the way that it was prescribed?0 4. How often have you used illegal drugs in the past 5 years? 0 5. How often, in your lifetime, have you had legal problems or been arrested? 0  Score 0  Alcohol Audit - How often during the last year have found that you: 0-Never   1- Less than monthly   2- Monthly     3-Weekly     4-daily or almost daily  1. found that you were not able to stop drinking once you started- 0 2. failed to do what was normally expected of you because of drinking- 0 3. needed a first drink in the morning- 0 4. had a feeling of guilt or remorse after drinking- 0 5. are/were unable to remember what happened the night before because of your drinking- 0  0- NO   2- yes but not in last year  4- yes during last year 6. Have you or someone else been injured because of your drinking- 0 7. Has anyone been concerned about your drinking or suggested you cut down- 0        TOTAL- 0  ( 0-7- alcohol education, 8-15- simple advice, 16-19 simple advice plus counseling, 20-40 referral for evaluation and treatment 0   Designated [place], [place]  Pain assessment: Cause of pain- Severe  degenerative disc disease, osteoarthritis lumbar Pain location- lumbar spine bilateral hips and sciatica Pain on scale of 1-10- 8 Frequency- daily What increases pain-lifting, standing too long What makes pain Better-rest, meds Effects on ADL - moderate  Prior treatments tried and failed- Pt, surgery, injections, OTC meds, NSAIDs Current treatments- oxycontin 40mg  BID, valium TID for spasms, still with [facility] for injections. Morphine mg equivalent- 120  Pain management agreement reviewed and signed- Yes  Past Medical History:  Diagnosis Date  . Anxiety   . Arthritis   . GERD (gastroesophageal reflux disease)   . Hyperlipidemia   . Hypertension    Relevant past medical, surgical, family and social history reviewed and updated as indicated. Interim medical history since our last visit reviewed. Allergies and medications reviewed and updated.   Data reviewed from any sources in EPIC.  Review of Systems  Constitutional: Negative.  Negative for activity change, fatigue and fever.  HENT: Negative.   Eyes: Negative.   Respiratory: Negative.  Negative for cough.   Cardiovascular: Negative.  Negative for chest pain.  Gastrointestinal: Negative.  Negative for abdominal pain.  Endocrine: Negative.   Genitourinary: Negative.  Negative  for dysuria.  Musculoskeletal: Positive for arthralgias, back pain, gait problem, joint swelling and myalgias.  Skin: Negative.      Social History   Social History  . Marital status: Married    Spouse name: N/A  . Number of children: N/A  . Years of education: N/A   Occupational History  . Not on file.   Social History Main Topics  . Smoking status: Never Smoker  . Smokeless tobacco: Never Used  . Alcohol use No  . Drug use: No  . Sexual activity: Not on file   Other Topics Concern  . Not on file   Social History Narrative  . No narrative on file    Past Surgical History:  Procedure Laterality Date  . ABDOMINAL HYSTERECTOMY    .  CERVICAL FUSION    . KNEE ARTHROSCOPY Left   . LUMBAR FUSION      Family History  Problem Relation Age of Onset  . Congestive Heart Failure Mother   . Arthritis Mother   . Cancer Father     lung  . Arthritis/Rheumatoid Maternal Grandmother   . Cancer Paternal Grandfather     skin      Medication List       Accurate as of 04/04/16 11:56 AM. Always use your most recent med list.          aspirin 81 MG chewable tablet Chew 81 mg by mouth daily.   atenolol 50 MG tablet Commonly known as:  TENORMIN Take 50 mg by mouth 2 (two) times daily.   diazepam 5 MG tablet Commonly known as:  VALIUM Take 5 mg by mouth 3 (three) times daily as needed.   hydrochlorothiazide 25 MG tablet Commonly known as:  HYDRODIURIL Take 25 mg by mouth daily.   isosorbide mononitrate 60 MG 24 hr tablet Commonly known as:  IMDUR Take 60 mg by mouth daily.   oxyCODONE 40 mg 12 hr tablet Commonly known as:  OXYCONTIN Take 40 mg by mouth 2 (two) times daily.   potassium chloride 10 MEQ tablet Commonly known as:  K-DUR Take 10 mEq by mouth daily.   rosuvastatin 5 MG tablet Commonly known as:  CRESTOR Take 5 mg by mouth daily.   topiramate 100 MG tablet Commonly known as:  TOPAMAX Take 100 mg by mouth 2 (two) times daily.          Objective:    BP 112/72   Pulse 87   Temp 97.6 F (36.4 C) (Oral)   Ht 5\' 6"  (1.676 m)   Wt 185 lb (83.9 kg)   BMI 29.86 kg/m   No Known Allergies Wt Readings from Last 3 Encounters:  04/04/16 185 lb (83.9 kg)    Physical Exam  Constitutional: She is oriented to person, place, and time. She appears well-developed and well-nourished.  HENT:  Head: Normocephalic and atraumatic.  Eyes: Conjunctivae and EOM are normal. Pupils are equal, round, and reactive to light.  Cardiovascular: Normal rate, regular rhythm, normal heart sounds and intact distal pulses.   Pulmonary/Chest: Effort normal and breath sounds normal.  Abdominal: Soft. Bowel sounds are  normal.  Musculoskeletal:       Lumbar back: She exhibits decreased range of motion, tenderness, pain and spasm.  Neurological: She is alert and oriented to person, place, and time. She has normal reflexes.  Skin: Skin is warm and dry. No rash noted.  Psychiatric: She has a normal mood and affect. Her behavior is normal. Judgment and thought content  normal.        Assessment & Plan:   1. Essential hypertension - hydrochlorothiazide (HYDRODIURIL) 25 MG tablet; Take 25 mg by mouth daily.  2. Gastroesophageal reflux disease without esophagitis  3. DDD (degenerative disc disease), lumbar - oxyCODONE (OXYCONTIN) 40 mg 12 hr tablet; Take 40 mg by mouth 2 (two) times daily. - diazepam (VALIUM) 5 MG tablet; Take 5 mg by mouth 3 (three) times daily as needed.  4. Osteoarthritis of spine with radiculopathy, lumbosacral region - oxyCODONE (OXYCONTIN) 40 mg 12 hr tablet; Take 40 mg by mouth 2 (two) times daily.  5. Coronary artery disease of native heart with stable angina pectoris, unspecified vessel or lesion type (HCC) - atenolol (TENORMIN) 50 MG tablet; Take 50 mg by mouth 2 (two) times daily. - rosuvastatin (CRESTOR) 5 MG tablet; Take 5 mg by mouth daily. - aspirin 81 MG chewable tablet; Chew 81 mg by mouth daily.  6. Angina pectoris (HCC) - isosorbide mononitrate (IMDUR) 60 MG 24 hr tablet; Take 60 mg by mouth daily.  7. Other migraine without status migrainosus, not intractable - topiramate (TOPAMAX) 100 MG tablet; Take 100 mg by mouth 2 (two) times daily.   Continue all other maintenance medications as listed above. Educational handout given for hyperlipid  Follow up plan: 3 months  Terald Sleeper PA-C [facility] [address]  [place],  [address] [phone]   04/04/2016, 11:56 AM

## 2016-04-05 ENCOUNTER — Other Ambulatory Visit: Payer: Self-pay

## 2016-04-06 ENCOUNTER — Telehealth: Payer: Self-pay

## 2016-04-06 NOTE — Telephone Encounter (Signed)
Actinic keratoses and solar lentigines.

## 2016-04-10 NOTE — Telephone Encounter (Signed)
x

## 2016-04-11 ENCOUNTER — Telehealth: Payer: Self-pay

## 2016-04-12 NOTE — Telephone Encounter (Signed)
noted 

## 2016-07-07 ENCOUNTER — Encounter: Payer: Self-pay | Admitting: Physician Assistant

## 2016-07-07 ENCOUNTER — Ambulatory Visit (INDEPENDENT_AMBULATORY_CARE_PROVIDER_SITE_OTHER): Payer: BLUE CROSS/BLUE SHIELD | Admitting: Physician Assistant

## 2016-07-07 VITALS — BP 121/70 | HR 91 | Temp 97.2°F | Ht 66.0 in | Wt 182.0 lb

## 2016-07-07 DIAGNOSIS — M5136 Other intervertebral disc degeneration, lumbar region: Secondary | ICD-10-CM | POA: Diagnosis not present

## 2016-07-07 DIAGNOSIS — I10 Essential (primary) hypertension: Secondary | ICD-10-CM | POA: Diagnosis not present

## 2016-07-07 DIAGNOSIS — M4727 Other spondylosis with radiculopathy, lumbosacral region: Secondary | ICD-10-CM | POA: Diagnosis not present

## 2016-07-07 DIAGNOSIS — K219 Gastro-esophageal reflux disease without esophagitis: Secondary | ICD-10-CM

## 2016-07-07 DIAGNOSIS — M7071 Other bursitis of hip, right hip: Secondary | ICD-10-CM | POA: Diagnosis not present

## 2016-07-07 DIAGNOSIS — M7070 Other bursitis of hip, unspecified hip: Secondary | ICD-10-CM | POA: Insufficient documentation

## 2016-07-07 MED ORDER — OXYCODONE HCL ER 40 MG PO T12A: 40.0000 mg | EXTENDED_RELEASE_TABLET | Freq: Two times a day (BID) | ORAL | 0 refills | Status: DC

## 2016-07-07 MED ORDER — DIAZEPAM 5 MG PO TABS: 5.0000 mg | ORAL_TABLET | Freq: Three times a day (TID) | ORAL | 5 refills | Status: DC | PRN

## 2016-07-07 NOTE — Patient Instructions (Signed)
Bursitis Introduction Bursitis is when the fluid-filled sac (bursa) that covers and protects a joint is swollen (inflamed). Bursitis is most common near joints, especially the knees, elbows, hips, and shoulders. Follow these instructions at home:  Take medicines only as told by your doctor.  If you were prescribed an antibiotic medicine, finish it all even if you start to feel better.  Rest the affected area as told by your doctor.  Keep the area raised up.  Avoid doing things that make the pain worse.  Apply ice to the injured area:  Place ice in a plastic bag.  Place a towel between your skin and the bag.  Leave the ice on for 20 minutes, 2-3 times a day.  Use splints, braces, pads, or walking aids as told by your doctor.  Keep all follow-up visits as told by your doctor. This is important. Contact a doctor if:  You have more pain with home care.  You have a fever.  You have chills. This information is not intended to replace advice given to you by your health care provider. Make sure you discuss any questions you have with your health care provider. Document Released: 11/09/2009 Document Revised: 10/28/2015 Document Reviewed: 08/11/2013  2017 Elsevier  

## 2016-07-10 NOTE — Progress Notes (Signed)
BP 121/70 (BP Location: Left Arm)   Pulse 91   Temp 97.2 F (36.2 C) (Oral)   Ht 5\' 6"  (1.676 m)   Wt 182 lb (82.6 kg)   BMI 29.38 kg/m    Subjective:    Patient ID: [name], female    DOB: 1955-11-03, 61 y.o.   MRN: [id number]  HPI: [name] is a 61 y.o. female presenting on 07/07/2016 for pain management (back, hips and knees)  This patient comes in for periodic recheck on medications and conditions. All medications are reviewed today. There are no reports of any problems with the medications. All of the medical conditions are reviewed and updated.  Lab work is reviewed and will be ordered as medically necessary. There are no new problems reported with today's visit.  Pain assessment: Cause of pain- DDD Pain location- lumbar and sciatica Pain on scale of 1-10- 6-8 Frequency- daily What increases pain-lifting, standing What makes pain Better-rest, medications Effects on ADL - moderate Any change in general medical condition-none  Current medications- oxycontin 40 mg BID, valium TID prn spasms Effectiveness of current meds-good Adverse reactions form pain meds-none  Pill count performed-No Urine drug screen- No Was the [place] reviewed- yes  If yes were their any concerning findings? - none   Relevant past medical, surgical, family and social history reviewed and updated as indicated. Allergies and medications reviewed and updated.  Past Medical History:  Diagnosis Date  . Anxiety   . Arthritis   . GERD (gastroesophageal reflux disease)   . Hyperlipidemia   . Hypertension     Past Surgical History:  Procedure Laterality Date  . ABDOMINAL HYSTERECTOMY    . CERVICAL FUSION    . KNEE ARTHROSCOPY Left   . LUMBAR FUSION      Review of Systems  Constitutional: Negative.   HENT: Negative.   Eyes: Negative.   Respiratory: Negative.   Gastrointestinal: Negative.   Genitourinary: Negative.   Musculoskeletal: Positive for arthralgias, back pain, gait  problem and myalgias.    Allergies as of 07/07/2016   No Known Allergies     Medication List       Accurate as of 07/07/16 11:59 PM. Always use your most recent med list.          amitriptyline 50 MG tablet Commonly known as:  ELAVIL Take 1 tablet by mouth at bedtime.   aspirin 81 MG chewable tablet Chew 81 mg by mouth daily.   atenolol 50 MG tablet Commonly known as:  TENORMIN Take 50 mg by mouth 2 (two) times daily.   DEXILANT 60 MG capsule Generic drug:  dexlansoprazole Take 1 capsule by mouth daily.   diazepam 5 MG tablet Commonly known as:  VALIUM Take 1 tablet (5 mg total) by mouth 3 (three) times daily as needed.   hydrochlorothiazide 25 MG tablet Commonly known as:  HYDRODIURIL Take 25 mg by mouth daily.   HYLATOPIC PLUS Crea Apply 1 application topically 2 (two) times daily. Dispense with PUMP   isosorbide mononitrate 60 MG 24 hr tablet Commonly known as:  IMDUR Take 60 mg by mouth daily.   oxyCODONE 40 mg 12 hr tablet Commonly known as:  OXYCONTIN Take 1 tablet (40 mg total) by mouth 2 (two) times daily.   oxyCODONE 40 mg 12 hr tablet Commonly known as:  OXYCONTIN Take 1 tablet (40 mg total) by mouth every 12 (twelve) hours.   oxyCODONE 40 mg 12 hr tablet Commonly known as:  OXYCONTIN Take 1 tablet (40 mg total) by mouth every 12 (twelve) hours.   potassium chloride 10 MEQ tablet Commonly known as:  K-DUR Take 10 mEq by mouth daily.   rosuvastatin 5 MG tablet Commonly known as:  CRESTOR Take 5 mg by mouth daily.   topiramate 100 MG tablet Commonly known as:  TOPAMAX Take 100 mg by mouth 2 (two) times daily.          Objective:    BP 121/70 (BP Location: Left Arm)   Pulse 91   Temp 97.2 F (36.2 C) (Oral)   Ht 5\' 6"  (1.676 m)   Wt 182 lb (82.6 kg)   BMI 29.38 kg/m   No Known Allergies  Physical Exam  Constitutional: She is oriented to person, place, and time. She appears well-developed and well-nourished.  HENT:  Head:  Normocephalic and atraumatic.  Eyes: Conjunctivae and EOM are normal. Pupils are equal, round, and reactive to light.  Cardiovascular: Normal rate, regular rhythm, normal heart sounds and intact distal pulses.   Pulmonary/Chest: Effort normal and breath sounds normal.  Abdominal: Soft. Bowel sounds are normal.  Musculoskeletal:       Lumbar back: She exhibits decreased range of motion, tenderness, pain and spasm.  Neurological: She is alert and oriented to person, place, and time. She has normal reflexes.  Skin: Skin is warm and dry. No rash noted.  Psychiatric: She has a normal mood and affect. Her behavior is normal. Judgment and thought content normal.    No results found for this or any previous visit.    Assessment & Plan:   1. DDD (degenerative disc disease), lumbar - amitriptyline (ELAVIL) 50 MG tablet; Take 1 tablet by mouth at bedtime.; Refill: 2 - oxyCODONE (OXYCONTIN) 40 mg 12 hr tablet; Take 1 tablet (40 mg total) by mouth 2 (two) times daily.  Dispense: 60 tablet; Refill: 0 - oxyCODONE (OXYCONTIN) 40 mg 12 hr tablet; Take 1 tablet (40 mg total) by mouth every 12 (twelve) hours.  Dispense: 60 tablet; Refill: 0 - oxyCODONE (OXYCONTIN) 40 mg 12 hr tablet; Take 1 tablet (40 mg total) by mouth every 12 (twelve) hours.  Dispense: 60 tablet; Refill: 0 - diazepam (VALIUM) 5 MG tablet; Take 1 tablet (5 mg total) by mouth 3 (three) times daily as needed.  Dispense: 90 tablet; Refill: 5  2. Essential hypertension  3. Osteoarthritis of spine with radiculopathy, lumbosacral region - oxyCODONE (OXYCONTIN) 40 mg 12 hr tablet; Take 1 tablet (40 mg total) by mouth 2 (two) times daily.  Dispense: 60 tablet; Refill: 0 - oxyCODONE (OXYCONTIN) 40 mg 12 hr tablet; Take 1 tablet (40 mg total) by mouth every 12 (twelve) hours.  Dispense: 60 tablet; Refill: 0 - oxyCODONE (OXYCONTIN) 40 mg 12 hr tablet; Take 1 tablet (40 mg total) by mouth every 12 (twelve) hours.  Dispense: 60 tablet; Refill: 0  4.  Bursitis of other bursa of right hip  5. Gastroesophageal reflux disease without esophagitis - DEXILANT 60 MG capsule; Take 1 capsule by mouth daily.; Refill: 0   Continue all other maintenance medications as listed above.  Follow up plan: Return in about 3 months (around 10/04/2016) for med recheck.  No orders of the defined types were placed in this encounter.   Educational handout given for DDD  Terald Sleeper PA-C [facility] [address]  [place], [place] [address] [phone]   07/10/2016, 12:12 PM

## 2016-09-12 ENCOUNTER — Telehealth: Payer: Self-pay | Admitting: Physician Assistant

## 2016-09-12 NOTE — Telephone Encounter (Signed)
Pt is unable to get her pain med refilled yet at [facility].  Will call pharmacy to see how we can help.  Rx was last filled 08/13/16. States not to be refilled to 09/13/16 - d/t 30 days from last refill. Gave verbal ok to refill today.

## 2016-10-04 ENCOUNTER — Ambulatory Visit (INDEPENDENT_AMBULATORY_CARE_PROVIDER_SITE_OTHER): Payer: BLUE CROSS/BLUE SHIELD | Admitting: Physician Assistant

## 2016-10-04 ENCOUNTER — Encounter: Payer: Self-pay | Admitting: Physician Assistant

## 2016-10-04 VITALS — BP 136/82 | HR 96 | Temp 98.2°F | Ht 66.0 in | Wt 188.0 lb

## 2016-10-04 DIAGNOSIS — L570 Actinic keratosis: Secondary | ICD-10-CM | POA: Diagnosis not present

## 2016-10-04 DIAGNOSIS — M4727 Other spondylosis with radiculopathy, lumbosacral region: Secondary | ICD-10-CM | POA: Diagnosis not present

## 2016-10-04 DIAGNOSIS — M5136 Other intervertebral disc degeneration, lumbar region: Secondary | ICD-10-CM | POA: Diagnosis not present

## 2016-10-04 DIAGNOSIS — L821 Other seborrheic keratosis: Secondary | ICD-10-CM

## 2016-10-04 DIAGNOSIS — X32XXXA Exposure to sunlight, initial encounter: Principal | ICD-10-CM

## 2016-10-04 MED ORDER — OXYCODONE HCL ER 40 MG PO T12A: 40.0000 mg | EXTENDED_RELEASE_TABLET | Freq: Two times a day (BID) | ORAL | 0 refills | Status: DC

## 2016-10-04 NOTE — Progress Notes (Signed)
BP 136/82   Pulse 96   Temp 98.2 F (36.8 C) (Oral)   Ht 5\' 6"  (1.676 m)   Wt 188 lb (85.3 kg)   BMI 30.34 kg/m    Subjective:    Patient ID: [name], female    DOB: 10/27/1955, 61 y.o.   MRN: [id number]  HPI: [name] is a 61 y.o. female presenting on 10/04/2016 for pain management (refills) and referral for dermatology (brown spots)  This patient comes in for periodic recheck on medications and conditions including DDD with chronic back and leg pain. Also with severe and very numerous actinic and soborrheic keratoses. Has long amounts of sun exposure and tanning beds.  She is visibly upset in talking about it and regrets having done this. Her father had very similar lesions. She has one on the wrist that is very black. Many with peel and stay pink.   All medications are reviewed today. There are no reports of any problems with the medications. All of the medical conditions are reviewed and updated.  Lab work is reviewed and will be ordered as medically necessary. There are no new problems reported with today's visit.   Relevant past medical, surgical, family and social history reviewed and updated as indicated. Allergies and medications reviewed and updated.  Past Medical History:  Diagnosis Date  . Anxiety   . Arthritis   . GERD (gastroesophageal reflux disease)   . Hyperlipidemia   . Hypertension     Past Surgical History:  Procedure Laterality Date  . ABDOMINAL HYSTERECTOMY    . CERVICAL FUSION    . KNEE ARTHROSCOPY Left   . LUMBAR FUSION      Review of Systems  Constitutional: Negative.  Negative for activity change, fatigue and fever.  HENT: Negative.   Eyes: Negative.   Respiratory: Negative.  Negative for cough.   Cardiovascular: Negative.  Negative for chest pain.  Gastrointestinal: Negative.  Negative for abdominal pain.  Endocrine: Negative.   Genitourinary: Negative.  Negative for dysuria.  Musculoskeletal: Negative.   Skin: Positive for color  change, rash and wound.  Neurological: Negative.     Allergies as of 10/04/2016   No Known Allergies     Medication List       Accurate as of 10/04/16 11:59 PM. Always use your most recent med list.          amitriptyline 50 MG tablet Commonly known as:  ELAVIL Take 1 tablet by mouth at bedtime.   aspirin 81 MG chewable tablet Chew 81 mg by mouth daily.   atenolol 25 MG tablet Commonly known as:  TENORMIN   DEXILANT 60 MG capsule Generic drug:  dexlansoprazole Take 1 capsule by mouth daily.   diazepam 5 MG tablet Commonly known as:  VALIUM Take 1 tablet (5 mg total) by mouth 3 (three) times daily as needed.   hydrochlorothiazide 25 MG tablet Commonly known as:  HYDRODIURIL Take 25 mg by mouth daily.   HYLATOPIC PLUS Crea Apply 1 application topically 2 (two) times daily. Dispense with PUMP   isosorbide mononitrate 60 MG 24 hr tablet Commonly known as:  IMDUR Take 60 mg by mouth daily.   oxyCODONE 40 mg 12 hr tablet Commonly known as:  OXYCONTIN Take 1 tablet (40 mg total) by mouth 2 (two) times daily.   oxyCODONE 40 mg 12 hr tablet Commonly known as:  OXYCONTIN Take 1 tablet (40 mg total) by mouth every 12 (twelve) hours.   oxyCODONE 40  mg 12 hr tablet Commonly known as:  OXYCONTIN Take 1 tablet (40 mg total) by mouth every 12 (twelve) hours.   potassium chloride 10 MEQ tablet Commonly known as:  K-DUR Take 10 mEq by mouth daily.   rosuvastatin 5 MG tablet Commonly known as:  CRESTOR Take 5 mg by mouth daily.   topiramate 100 MG tablet Commonly known as:  TOPAMAX Take 100 mg by mouth 2 (two) times daily.          Objective:    BP 136/82   Pulse 96   Temp 98.2 F (36.8 C) (Oral)   Ht 5\' 6"  (1.676 m)   Wt 188 lb (85.3 kg)   BMI 30.34 kg/m   No Known Allergies  Physical Exam  Constitutional: She is oriented to person, place, and time. She appears well-developed and well-nourished.  HENT:  Head: Normocephalic and atraumatic.  Eyes:  Conjunctivae and EOM are normal. Pupils are equal, round, and reactive to light.  Cardiovascular: Normal rate, regular rhythm, normal heart sounds and intact distal pulses.   Pulmonary/Chest: Effort normal and breath sounds normal.  Abdominal: Soft. Bowel sounds are normal.  Neurological: She is alert and oriented to person, place, and time. She has normal reflexes.  Skin: Skin is warm and dry. Lesion and rash noted. Rash is maculopapular.  Multiple actinic and seborrheic keratoses on face, legs and arms  Psychiatric: She has a normal mood and affect. Her behavior is normal. Judgment and thought content normal.  Nursing note and vitals reviewed.   No results found for this or any previous visit.    Assessment & Plan:   1. Actinic keratosis due to exposure to sunlight - Ambulatory referral to Dermatology  2. Seborrheic keratosis - Ambulatory referral to Dermatology  3. DDD (degenerative disc disease), lumbar - oxyCODONE (OXYCONTIN) 40 mg 12 hr tablet; Take 1 tablet (40 mg total) by mouth 2 (two) times daily.  Dispense: 60 tablet; Refill: 0 - oxyCODONE (OXYCONTIN) 40 mg 12 hr tablet; Take 1 tablet (40 mg total) by mouth every 12 (twelve) hours.  Dispense: 60 tablet; Refill: 0 - oxyCODONE (OXYCONTIN) 40 mg 12 hr tablet; Take 1 tablet (40 mg total) by mouth every 12 (twelve) hours.  Dispense: 60 tablet; Refill: 0  4. Osteoarthritis of spine with radiculopathy, lumbosacral region - oxyCODONE (OXYCONTIN) 40 mg 12 hr tablet; Take 1 tablet (40 mg total) by mouth 2 (two) times daily.  Dispense: 60 tablet; Refill: 0 - oxyCODONE (OXYCONTIN) 40 mg 12 hr tablet; Take 1 tablet (40 mg total) by mouth every 12 (twelve) hours.  Dispense: 60 tablet; Refill: 0 - oxyCODONE (OXYCONTIN) 40 mg 12 hr tablet; Take 1 tablet (40 mg total) by mouth every 12 (twelve) hours.  Dispense: 60 tablet; Refill: 0   Current Outpatient Prescriptions:  .  amitriptyline (ELAVIL) 50 MG tablet, Take 1 tablet by mouth at  bedtime., Disp: , Rfl: 2 .  aspirin 81 MG chewable tablet, Chew 81 mg by mouth daily., Disp: , Rfl:  .  Dermatological Products, Misc. (HYLATOPIC PLUS) CREA, Apply 1 application topically 2 (two) times daily. Dispense with PUMP, Disp: 450 g, Rfl: 11 .  DEXILANT 60 MG capsule, Take 1 capsule by mouth daily., Disp: , Rfl: 0 .  diazepam (VALIUM) 5 MG tablet, Take 1 tablet (5 mg total) by mouth 3 (three) times daily as needed., Disp: 90 tablet, Rfl: 5 .  hydrochlorothiazide (HYDRODIURIL) 25 MG tablet, Take 25 mg by mouth daily., Disp: , Rfl:  .  isosorbide mononitrate (IMDUR) 60 MG 24 hr tablet, Take 60 mg by mouth daily., Disp: , Rfl:  .  oxyCODONE (OXYCONTIN) 40 mg 12 hr tablet, Take 1 tablet (40 mg total) by mouth 2 (two) times daily., Disp: 60 tablet, Rfl: 0 .  potassium chloride (K-DUR) 10 MEQ tablet, Take 10 mEq by mouth daily., Disp: , Rfl: 0 .  rosuvastatin (CRESTOR) 5 MG tablet, Take 5 mg by mouth daily., Disp: , Rfl:  .  topiramate (TOPAMAX) 100 MG tablet, Take 100 mg by mouth 2 (two) times daily., Disp: , Rfl:  .  atenolol (TENORMIN) 25 MG tablet, , Disp: , Rfl: 2 .  oxyCODONE (OXYCONTIN) 40 mg 12 hr tablet, Take 1 tablet (40 mg total) by mouth every 12 (twelve) hours., Disp: 60 tablet, Rfl: 0 .  oxyCODONE (OXYCONTIN) 40 mg 12 hr tablet, Take 1 tablet (40 mg total) by mouth every 12 (twelve) hours., Disp: 60 tablet, Rfl: 0  Continue all other maintenance medications as listed above.  Follow up plan: Return in about 3 months (around 01/04/2017) for recheck.  Educational handout given for seborrheic keratosis  Terald Sleeper PA-C [facility] [address]  [place], [place] [address] [phone]   10/05/2016, 6:06 PM

## 2016-10-04 NOTE — Patient Instructions (Signed)
Seborrheic Keratosis Seborrheic keratosis is a common, noncancerous (benign) skin growth. This condition causes waxy, rough, tan, brown, or black spots to appear on the skin. These skin growths can be flat or raised. What are the causes? The cause of this condition is not known. What increases the risk? This condition is more likely to develop in:  People who have a family history of seborrheic keratosis.  People who are 50 or older.  People who are pregnant.  People who have had estrogen replacement therapy.  What are the signs or symptoms? This condition often occurs on the face, chest, shoulders, back, or other areas. These growths:  Are usually painless, but may become irritated and itchy.  Can be yellow, brown, black, or other colors.  Are slightly raised or have a flat surface.  Are sometimes rough or wart-like in texture.  Are often waxy on the surface.  Are round or oval-shaped.  Sometimes look like they are "stuck on."  Often occur in groups, but may occur as a single growth.  How is this diagnosed? This condition is diagnosed with a medical history and physical exam. A sample of the growth may be tested (skin biopsy). You may need to see a skin specialist (dermatologist). How is this treated? Treatment is not usually needed for this condition, unless the growths are irritated or are often bleeding. You may also choose to have the growths removed if you do not like their appearance. Most commonly, these growths are treated with a procedure in which liquid nitrogen is applied to "freeze" off the growth (cryosurgery). They may also be burned off with electricity or cut off. Follow these instructions at home:  Watch your growth for any changes.  Keep all follow-up visits as told by your health care provider. This is important.  Do not scratch or pick at the growth or growths. This can cause them to become irritated or infected. Contact a health care provider  if:  You suddenly have many new growths.  Your growth bleeds, itches, or hurts.  Your growth suddenly becomes larger or changes color. This information is not intended to replace advice given to you by your health care provider. Make sure you discuss any questions you have with your health care provider. Document Released: 06/24/2010 Document Revised: 10/28/2015 Document Reviewed: 10/07/2014 Elsevier Interactive Patient Education  2017 Elsevier Inc.  

## 2016-10-05 DIAGNOSIS — L821 Other seborrheic keratosis: Secondary | ICD-10-CM | POA: Insufficient documentation

## 2016-10-05 DIAGNOSIS — X32XXXA Exposure to sunlight, initial encounter: Principal | ICD-10-CM

## 2016-10-05 DIAGNOSIS — L570 Actinic keratosis: Secondary | ICD-10-CM | POA: Insufficient documentation

## 2016-10-23 ENCOUNTER — Telehealth: Payer: Self-pay | Admitting: Physician Assistant

## 2016-10-23 NOTE — Telephone Encounter (Signed)
Denied.

## 2016-11-24 ENCOUNTER — Other Ambulatory Visit: Payer: Self-pay | Admitting: *Deleted

## 2016-11-24 DIAGNOSIS — M5136 Other intervertebral disc degeneration, lumbar region: Secondary | ICD-10-CM

## 2016-11-24 MED ORDER — DIAZEPAM 5 MG PO TABS: 5.0000 mg | ORAL_TABLET | Freq: Three times a day (TID) | ORAL | 5 refills | Status: DC | PRN

## 2016-11-24 NOTE — Telephone Encounter (Signed)
Last filled 06/11/2016. If approved please route to pool for nurse to call into pharmacy.

## 2016-11-25 NOTE — Telephone Encounter (Signed)
Refill called to [facility] 

## 2017-01-02 ENCOUNTER — Other Ambulatory Visit: Payer: Self-pay | Admitting: *Deleted

## 2017-01-02 DIAGNOSIS — G43809 Other migraine, not intractable, without status migrainosus: Secondary | ICD-10-CM

## 2017-01-02 MED ORDER — TOPIRAMATE 100 MG PO TABS: 100.0000 mg | ORAL_TABLET | Freq: Two times a day (BID) | ORAL | 1 refills | Status: DC

## 2017-01-08 ENCOUNTER — Encounter: Payer: Self-pay | Admitting: Physician Assistant

## 2017-01-08 ENCOUNTER — Ambulatory Visit (INDEPENDENT_AMBULATORY_CARE_PROVIDER_SITE_OTHER): Payer: BLUE CROSS/BLUE SHIELD | Admitting: Physician Assistant

## 2017-01-08 DIAGNOSIS — M4727 Other spondylosis with radiculopathy, lumbosacral region: Secondary | ICD-10-CM | POA: Diagnosis not present

## 2017-01-08 DIAGNOSIS — M5136 Other intervertebral disc degeneration, lumbar region: Secondary | ICD-10-CM

## 2017-01-08 DIAGNOSIS — G43809 Other migraine, not intractable, without status migrainosus: Secondary | ICD-10-CM

## 2017-01-08 MED ORDER — OXYCODONE HCL ER 40 MG PO T12A: 40.0000 mg | EXTENDED_RELEASE_TABLET | Freq: Two times a day (BID) | ORAL | 0 refills | Status: DC

## 2017-01-08 MED ORDER — AMITRIPTYLINE HCL 50 MG PO TABS: 50.0000 mg | ORAL_TABLET | Freq: Every day | ORAL | 11 refills | Status: DC

## 2017-01-08 MED ORDER — TOPIRAMATE 100 MG PO TABS: 100.0000 mg | ORAL_TABLET | Freq: Two times a day (BID) | ORAL | 11 refills | Status: DC

## 2017-01-08 NOTE — Patient Instructions (Signed)
In a few days you may receive a survey in the mail or online from [employer] regarding your visit with us today. Please take a moment to fill this out. Your feedback is very important to our whole office. It can help us better understand your needs as well as improve your experience and satisfaction. Thank you for taking your time to complete it. We care about you.  Jacqulyn Barresi, PA-C  

## 2017-01-08 NOTE — Progress Notes (Signed)
BP 106/68   Pulse 92   Temp 99.3 F (37.4 C) (Oral)   Ht 5\' 6"  (1.676 m)   Wt 195 lb (88.5 kg)   BMI 31.47 kg/m    Subjective:    Patient ID: [name], female    DOB: 07-20-1955, 61 y.o.   MRN: [id number]  HPI: [name] is a 61 y.o. female presenting on 01/08/2017 for Follow-up (3 month ) and Hypertension  This patient comes in for periodic recheck on medications and conditions including Degenerative disc disease, osteoarthritis of the spine, osteoporosis of other joints, history of migraine. Overall the patient is doing fairly well with her medications. She does need refills today. She can tell a difference with taking the Topamax that it also helps her peripheral nerve neuropathy.  The patient has an appointment with dermatology at [facility] later in the week. The dermatologist will see her for her severe seborrheic keratoses and irritated lesions.   All medications are reviewed today. There are no reports of any problems with the medications. All of the medical conditions are reviewed and updated.  Lab work is reviewed and will be ordered as medically necessary. There are no new problems reported with today's visit.   Relevant past medical, surgical, family and social history reviewed and updated as indicated. Allergies and medications reviewed and updated.  Past Medical History:  Diagnosis Date  . Anxiety   . Arthritis   . GERD (gastroesophageal reflux disease)   . Hyperlipidemia   . Hypertension     Past Surgical History:  Procedure Laterality Date  . ABDOMINAL HYSTERECTOMY    . CERVICAL FUSION    . KNEE ARTHROSCOPY Left   . LUMBAR FUSION      Review of Systems  Constitutional: Negative.  Negative for activity change, fatigue and fever.  HENT: Negative.   Eyes: Negative.   Respiratory: Negative.  Negative for cough.   Cardiovascular: Negative.  Negative for chest pain.  Gastrointestinal: Negative.  Negative for abdominal pain.    Endocrine: Negative.   Genitourinary: Negative.  Negative for dysuria.  Musculoskeletal: Positive for arthralgias, back pain, gait problem, joint swelling and myalgias.  Skin: Positive for color change and wound.    Allergies as of 01/08/2017   No Known Allergies     Medication List       Accurate as of 01/08/17  1:58 PM. Always use your most recent med list.          amitriptyline 50 MG tablet Commonly known as:  ELAVIL Take 1 tablet (50 mg total) by mouth at bedtime.   aspirin 81 MG chewable tablet Chew 81 mg by mouth daily.   atenolol 25 MG tablet Commonly known as:  TENORMIN   DEXILANT 60 MG capsule Generic drug:  dexlansoprazole Take 1 capsule by mouth daily.   diazepam 5 MG tablet Commonly known as:  VALIUM Take 1 tablet (5 mg total) by mouth 3 (three) times daily as needed.   hydrochlorothiazide 25 MG tablet Commonly known as:  HYDRODIURIL Take 25 mg by mouth daily.   isosorbide mononitrate 60 MG 24 hr tablet Commonly known as:  IMDUR Take 60 mg by mouth daily.   oxyCODONE 40 mg 12 hr tablet Commonly known as:  OXYCONTIN Take 1 tablet (40 mg total) by mouth 2 (two) times daily.   oxyCODONE 40 mg 12 hr tablet Commonly known as:  OXYCONTIN Take 1 tablet (40 mg total) by mouth every 12 (twelve) hours.  oxyCODONE 40 mg 12 hr tablet Commonly known as:  OXYCONTIN Take 1 tablet (40 mg total) by mouth every 12 (twelve) hours.   potassium chloride 10 MEQ tablet Commonly known as:  K-DUR Take 10 mEq by mouth daily.   rosuvastatin 5 MG tablet Commonly known as:  CRESTOR Take 5 mg by mouth daily.   topiramate 100 MG tablet Commonly known as:  TOPAMAX Take 1 tablet (100 mg total) by mouth 2 (two) times daily.          Objective:    BP 106/68   Pulse 92   Temp 99.3 F (37.4 C) (Oral)   Ht 5\' 6"  (1.676 m)   Wt 195 lb (88.5 kg)   BMI 31.47 kg/m   No Known Allergies  Physical Exam  Constitutional: She is oriented to person, place, and time.  She appears well-developed and well-nourished.  HENT:  Head: Normocephalic and atraumatic.  Eyes: Pupils are equal, round, and reactive to light. Conjunctivae and EOM are normal.  Cardiovascular: Normal rate, regular rhythm, normal heart sounds and intact distal pulses.   Pulmonary/Chest: Effort normal and breath sounds normal.  Abdominal: Soft. Bowel sounds are normal.  Neurological: She is alert and oriented to person, place, and time. She has normal reflexes.  Skin: Skin is warm and dry. No rash noted.  Psychiatric: She has a normal mood and affect. Her behavior is normal. Judgment and thought content normal.  Nursing note and vitals reviewed.   No results found for this or any previous visit.    Assessment & Plan:   1. DDD (degenerative disc disease), lumbar - oxyCODONE (OXYCONTIN) 40 mg 12 hr tablet; Take 1 tablet (40 mg total) by mouth 2 (two) times daily.  Dispense: 60 tablet; Refill: 0 - oxyCODONE (OXYCONTIN) 40 mg 12 hr tablet; Take 1 tablet (40 mg total) by mouth every 12 (twelve) hours.  Dispense: 60 tablet; Refill: 0 - oxyCODONE (OXYCONTIN) 40 mg 12 hr tablet; Take 1 tablet (40 mg total) by mouth every 12 (twelve) hours.  Dispense: 60 tablet; Refill: 0 - amitriptyline (ELAVIL) 50 MG tablet; Take 1 tablet (50 mg total) by mouth at bedtime.  Dispense: 30 tablet; Refill: 11  2. Osteoarthritis of spine with radiculopathy, lumbosacral region - oxyCODONE (OXYCONTIN) 40 mg 12 hr tablet; Take 1 tablet (40 mg total) by mouth 2 (two) times daily.  Dispense: 60 tablet; Refill: 0 - oxyCODONE (OXYCONTIN) 40 mg 12 hr tablet; Take 1 tablet (40 mg total) by mouth every 12 (twelve) hours.  Dispense: 60 tablet; Refill: 0 - oxyCODONE (OXYCONTIN) 40 mg 12 hr tablet; Take 1 tablet (40 mg total) by mouth every 12 (twelve) hours.  Dispense: 60 tablet; Refill: 0  3. Other migraine without status migrainosus, not intractable - topiramate (TOPAMAX) 100 MG tablet; Take 1 tablet (100 mg total) by mouth  2 (two) times daily.  Dispense: 60 tablet; Refill: 11   Current Outpatient Prescriptions:  .  amitriptyline (ELAVIL) 50 MG tablet, Take 1 tablet (50 mg total) by mouth at bedtime., Disp: 30 tablet, Rfl: 11 .  aspirin 81 MG chewable tablet, Chew 81 mg by mouth daily., Disp: , Rfl:  .  atenolol (TENORMIN) 25 MG tablet, , Disp: , Rfl: 2 .  DEXILANT 60 MG capsule, Take 1 capsule by mouth daily., Disp: , Rfl: 0 .  diazepam (VALIUM) 5 MG tablet, Take 1 tablet (5 mg total) by mouth 3 (three) times daily as needed., Disp: 90 tablet, Rfl: 5 .  hydrochlorothiazide (  HYDRODIURIL) 25 MG tablet, Take 25 mg by mouth daily., Disp: , Rfl:  .  isosorbide mononitrate (IMDUR) 60 MG 24 hr tablet, Take 60 mg by mouth daily., Disp: , Rfl:  .  oxyCODONE (OXYCONTIN) 40 mg 12 hr tablet, Take 1 tablet (40 mg total) by mouth 2 (two) times daily., Disp: 60 tablet, Rfl: 0 .  oxyCODONE (OXYCONTIN) 40 mg 12 hr tablet, Take 1 tablet (40 mg total) by mouth every 12 (twelve) hours., Disp: 60 tablet, Rfl: 0 .  oxyCODONE (OXYCONTIN) 40 mg 12 hr tablet, Take 1 tablet (40 mg total) by mouth every 12 (twelve) hours., Disp: 60 tablet, Rfl: 0 .  potassium chloride (K-DUR) 10 MEQ tablet, Take 10 mEq by mouth daily., Disp: , Rfl: 0 .  rosuvastatin (CRESTOR) 5 MG tablet, Take 5 mg by mouth daily., Disp: , Rfl:  .  topiramate (TOPAMAX) 100 MG tablet, Take 1 tablet (100 mg total) by mouth 2 (two) times daily., Disp: 60 tablet, Rfl: 11  Continue all other maintenance medications as listed above.  Follow up plan: Return in about 3 months (around 04/10/2017) for recheck.  Educational handout given for [place] PA-C [facility] [address]  [place], [place] [address] [phone]   01/08/2017, 1:58 PM

## 2017-04-10 ENCOUNTER — Encounter: Payer: Self-pay | Admitting: Physician Assistant

## 2017-04-10 ENCOUNTER — Ambulatory Visit (INDEPENDENT_AMBULATORY_CARE_PROVIDER_SITE_OTHER): Payer: BLUE CROSS/BLUE SHIELD | Admitting: Physician Assistant

## 2017-04-10 VITALS — BP 107/67 | HR 101 | Temp 97.3°F | Ht 66.0 in | Wt 192.0 lb

## 2017-04-10 DIAGNOSIS — M4727 Other spondylosis with radiculopathy, lumbosacral region: Secondary | ICD-10-CM | POA: Diagnosis not present

## 2017-04-10 DIAGNOSIS — I10 Essential (primary) hypertension: Secondary | ICD-10-CM | POA: Diagnosis not present

## 2017-04-10 DIAGNOSIS — M51369 Other intervertebral disc degeneration, lumbar region without mention of lumbar back pain or lower extremity pain: Secondary | ICD-10-CM

## 2017-04-10 DIAGNOSIS — M5136 Other intervertebral disc degeneration, lumbar region: Secondary | ICD-10-CM

## 2017-04-10 MED ORDER — OXYCODONE HCL ER 40 MG PO T12A: 40.0000 mg | EXTENDED_RELEASE_TABLET | Freq: Two times a day (BID) | ORAL | 0 refills | Status: DC

## 2017-04-10 MED ORDER — DIAZEPAM 5 MG PO TABS: 5.0000 mg | ORAL_TABLET | Freq: Three times a day (TID) | ORAL | 5 refills | Status: DC | PRN

## 2017-04-10 NOTE — Progress Notes (Signed)
BP 107/67   Pulse (!) 101   Temp (!) 97.3 F (36.3 C) (Oral)   Ht 5\' 6"  (1.676 m)   Wt 192 lb (87.1 kg)   BMI 30.99 kg/m    Subjective:    Patient ID: [name], female    DOB: July 02, 1955, 61 y.o.   MRN: [id number]  HPI: [name] is a 61 y.o. female presenting on 04/10/2017 for Follow-up (3 month )  This patient comes in for periodic recheck on medications and conditions including hypertension, degenerative disc disease and osteoarthritis.  Overall she is doing well with these things.  Her medications will be refilled today.  She has been going to [facility] for dermatology.  She had a very large squamous cell carcinoma removed from her back.  She has another one to be removed in a couple months.  She is using a couple of creams on her skin to soften up the multiple actinic keratoses..   All medications are reviewed today. There are no reports of any problems with the medications. All of the medical conditions are reviewed and updated.  Lab work is reviewed and will be ordered as medically necessary. There are no new problems reported with today's visit.   Relevant past medical, surgical, family and social history reviewed and updated as indicated. Allergies and medications reviewed and updated.  Past Medical History:  Diagnosis Date  . Anxiety   . Arthritis   . GERD (gastroesophageal reflux disease)   . Hyperlipidemia   . Hypertension     Past Surgical History:  Procedure Laterality Date  . ABDOMINAL HYSTERECTOMY    . CERVICAL FUSION    . KNEE ARTHROSCOPY Left   . LUMBAR FUSION      Review of Systems  Constitutional: Negative.  Negative for activity change, fatigue and fever.  HENT: Negative.   Eyes: Negative.   Respiratory: Negative.  Negative for cough.   Cardiovascular: Negative.  Negative for chest pain.  Gastrointestinal: Negative.  Negative for abdominal pain.  Endocrine: Negative.   Genitourinary: Negative.  Negative for dysuria.    Musculoskeletal: Positive for arthralgias, back pain, gait problem, joint swelling and myalgias.  Skin: Negative.     Allergies as of 04/10/2017   No Known Allergies     Medication List        Accurate as of 04/10/17 12:07 PM. Always use your most recent med list.          amitriptyline 50 MG tablet Commonly known as:  ELAVIL Take 1 tablet (50 mg total) by mouth at bedtime.   aspirin 81 MG chewable tablet Chew 81 mg by mouth daily.   atenolol 25 MG tablet Commonly known as:  TENORMIN   DEXILANT 60 MG capsule Generic drug:  dexlansoprazole Take 1 capsule by mouth daily.   diazepam 5 MG tablet Commonly known as:  VALIUM Take 1 tablet (5 mg total) 3 (three) times daily as needed by mouth.   hydrochlorothiazide 25 MG tablet Commonly known as:  HYDRODIURIL Take 25 mg by mouth daily.   isosorbide mononitrate 60 MG 24 hr tablet Commonly known as:  IMDUR Take 60 mg by mouth daily.   oxyCODONE 40 mg 12 hr tablet Commonly known as:  OXYCONTIN Take 1 tablet (40 mg total) 2 (two) times daily by mouth.   oxyCODONE 40 mg 12 hr tablet Commonly known as:  OXYCONTIN Take 1 tablet (40 mg total) every 12 (twelve) hours by mouth.   oxyCODONE  40 mg 12 hr tablet Commonly known as:  OXYCONTIN Take 1 tablet (40 mg total) every 12 (twelve) hours by mouth.   potassium chloride 10 MEQ tablet Commonly known as:  K-DUR Take 10 mEq by mouth daily.   rosuvastatin 5 MG tablet Commonly known as:  CRESTOR Take 5 mg by mouth daily.   topiramate 100 MG tablet Commonly known as:  TOPAMAX Take 1 tablet (100 mg total) by mouth 2 (two) times daily.          Objective:    BP 107/67   Pulse (!) 101   Temp (!) 97.3 F (36.3 C) (Oral)   Ht 5\' 6"  (1.676 m)   Wt 192 lb (87.1 kg)   BMI 30.99 kg/m   No Known Allergies  Physical Exam  Constitutional: She is oriented to person, place, and time. She appears well-developed and well-nourished.  HENT:  Head: Normocephalic and  atraumatic.  Eyes: Conjunctivae and EOM are normal. Pupils are equal, round, and reactive to light.  Cardiovascular: Normal rate, regular rhythm, normal heart sounds and intact distal pulses.  Pulmonary/Chest: Effort normal and breath sounds normal.  Abdominal: Soft. Bowel sounds are normal.  Neurological: She is alert and oriented to person, place, and time. She has normal reflexes.  Skin: Skin is warm and dry. No rash noted.     Postsurgical scar removal is clean and well-healed.  Psychiatric: She has a normal mood and affect. Her behavior is normal. Judgment and thought content normal.    No results found for this or any previous visit.    Assessment & Plan:   1. Essential hypertension  2. DDD (degenerative disc disease), lumbar - oxyCODONE (OXYCONTIN) 40 mg 12 hr tablet; Take 1 tablet (40 mg total) 2 (two) times daily by mouth.  Dispense: 60 tablet; Refill: 0 - oxyCODONE (OXYCONTIN) 40 mg 12 hr tablet; Take 1 tablet (40 mg total) every 12 (twelve) hours by mouth.  Dispense: 60 tablet; Refill: 0 - oxyCODONE (OXYCONTIN) 40 mg 12 hr tablet; Take 1 tablet (40 mg total) every 12 (twelve) hours by mouth.  Dispense: 60 tablet; Refill: 0 - diazepam (VALIUM) 5 MG tablet; Take 1 tablet (5 mg total) 3 (three) times daily as needed by mouth.  Dispense: 90 tablet; Refill: 5  3. Osteoarthritis of spine with radiculopathy, lumbosacral region - oxyCODONE (OXYCONTIN) 40 mg 12 hr tablet; Take 1 tablet (40 mg total) 2 (two) times daily by mouth.  Dispense: 60 tablet; Refill: 0 - oxyCODONE (OXYCONTIN) 40 mg 12 hr tablet; Take 1 tablet (40 mg total) every 12 (twelve) hours by mouth.  Dispense: 60 tablet; Refill: 0 - oxyCODONE (OXYCONTIN) 40 mg 12 hr tablet; Take 1 tablet (40 mg total) every 12 (twelve) hours by mouth.  Dispense: 60 tablet; Refill: 0    Current Outpatient Medications:  .  amitriptyline (ELAVIL) 50 MG tablet, Take 1 tablet (50 mg total) by mouth at bedtime., Disp: 30 tablet, Rfl: 11 .   aspirin 81 MG chewable tablet, Chew 81 mg by mouth daily., Disp: , Rfl:  .  atenolol (TENORMIN) 25 MG tablet, , Disp: , Rfl: 2 .  DEXILANT 60 MG capsule, Take 1 capsule by mouth daily., Disp: , Rfl: 0 .  diazepam (VALIUM) 5 MG tablet, Take 1 tablet (5 mg total) 3 (three) times daily as needed by mouth., Disp: 90 tablet, Rfl: 5 .  hydrochlorothiazide (HYDRODIURIL) 25 MG tablet, Take 25 mg by mouth daily., Disp: , Rfl:  .  isosorbide mononitrate (  IMDUR) 60 MG 24 hr tablet, Take 60 mg by mouth daily., Disp: , Rfl:  .  oxyCODONE (OXYCONTIN) 40 mg 12 hr tablet, Take 1 tablet (40 mg total) 2 (two) times daily by mouth., Disp: 60 tablet, Rfl: 0 .  oxyCODONE (OXYCONTIN) 40 mg 12 hr tablet, Take 1 tablet (40 mg total) every 12 (twelve) hours by mouth., Disp: 60 tablet, Rfl: 0 .  oxyCODONE (OXYCONTIN) 40 mg 12 hr tablet, Take 1 tablet (40 mg total) every 12 (twelve) hours by mouth., Disp: 60 tablet, Rfl: 0 .  potassium chloride (K-DUR) 10 MEQ tablet, Take 10 mEq by mouth daily., Disp: , Rfl: 0 .  rosuvastatin (CRESTOR) 5 MG tablet, Take 5 mg by mouth daily., Disp: , Rfl:  .  topiramate (TOPAMAX) 100 MG tablet, Take 1 tablet (100 mg total) by mouth 2 (two) times daily., Disp: 60 tablet, Rfl: 11 Continue all other maintenance medications as listed above.  Follow up plan: Return in about 12 weeks (around 07/03/2017) for recheck.  Educational handout given for [place] PA-C [place] [address]  [place], [place] [address] [phone]   04/10/2017, 12:07 PM

## 2017-04-10 NOTE — Patient Instructions (Signed)
In a few days you may receive a survey in the mail or online from [employer] regarding your visit with us today. Please take a moment to fill this out. Your feedback is very important to our whole office. It can help us better understand your needs as well as improve your experience and satisfaction. Thank you for taking your time to complete it. We care about you.  Nancyjo Givhan, PA-C  

## 2017-07-03 ENCOUNTER — Encounter: Payer: Self-pay | Admitting: Physician Assistant

## 2017-07-03 ENCOUNTER — Ambulatory Visit: Payer: BLUE CROSS/BLUE SHIELD | Admitting: Physician Assistant

## 2017-07-03 VITALS — BP 115/69 | HR 102 | Temp 99.0°F | Ht 66.0 in | Wt 193.8 lb

## 2017-07-03 DIAGNOSIS — M5136 Other intervertebral disc degeneration, lumbar region: Secondary | ICD-10-CM

## 2017-07-03 DIAGNOSIS — R5383 Other fatigue: Secondary | ICD-10-CM

## 2017-07-03 DIAGNOSIS — M4727 Other spondylosis with radiculopathy, lumbosacral region: Secondary | ICD-10-CM | POA: Diagnosis not present

## 2017-07-03 DIAGNOSIS — R599 Enlarged lymph nodes, unspecified: Secondary | ICD-10-CM | POA: Insufficient documentation

## 2017-07-03 MED ORDER — CLINDAMYCIN HCL 300 MG PO CAPS: 300.0000 mg | ORAL_CAPSULE | Freq: Three times a day (TID) | ORAL | 0 refills | Status: DC

## 2017-07-03 MED ORDER — OXYCODONE HCL ER 60 MG PO T12A: 60.0000 mg | EXTENDED_RELEASE_TABLET | Freq: Two times a day (BID) | ORAL | 0 refills | Status: DC

## 2017-07-03 NOTE — Patient Instructions (Signed)
In a few days you may receive a survey in the mail or online from [employer] regarding your visit with us today. Please take a moment to fill this out. Your feedback is very important to our whole office. It can help us better understand your needs as well as improve your experience and satisfaction. Thank you for taking your time to complete it. We care about you.  Faust Thorington, PA-C  

## 2017-07-04 LAB — CBC WITH DIFFERENTIAL/PLATELET
BASOS ABS: 0.1 10*3/uL (ref 0.0–0.2)
Basos: 1 %
EOS (ABSOLUTE): 0.5 10*3/uL — AB (ref 0.0–0.4)
Eos: 4 %
HEMOGLOBIN: 14.2 g/dL (ref 11.1–15.9)
Hematocrit: 41.5 % (ref 34.0–46.6)
IMMATURE GRANS (ABS): 0.1 10*3/uL (ref 0.0–0.1)
IMMATURE GRANULOCYTES: 1 %
LYMPHS: 20 %
Lymphocytes Absolute: 2.6 10*3/uL (ref 0.7–3.1)
MCH: 32.7 pg (ref 26.6–33.0)
MCHC: 34.2 g/dL (ref 31.5–35.7)
MCV: 96 fL (ref 79–97)
MONOCYTES: 10 %
Monocytes Absolute: 1.3 10*3/uL — ABNORMAL HIGH (ref 0.1–0.9)
NEUTROS PCT: 64 %
Neutrophils Absolute: 8.6 10*3/uL — ABNORMAL HIGH (ref 1.4–7.0)
PLATELETS: 362 10*3/uL (ref 150–379)
RBC: 4.34 x10E6/uL (ref 3.77–5.28)
RDW: 13.2 % (ref 12.3–15.4)
WBC: 13.1 10*3/uL — ABNORMAL HIGH (ref 3.4–10.8)

## 2017-07-04 LAB — THYROID PANEL WITH TSH
FREE THYROXINE INDEX: 1.7 (ref 1.2–4.9)
T3 UPTAKE RATIO: 21 % — AB (ref 24–39)
T4 TOTAL: 8 ug/dL (ref 4.5–12.0)
TSH: 2.67 u[IU]/mL (ref 0.450–4.500)

## 2017-07-04 NOTE — Progress Notes (Signed)
BP 115/69   Pulse (!) 102   Temp 99 F (37.2 C) (Oral)   Ht 5\' 6"  (1.676 m)   Wt 193 lb 12.8 oz (87.9 kg)   BMI 31.28 kg/m    Subjective:    Patient ID: [name], female    DOB: May 12, 1956, 62 y.o.   MRN: [id number]  HPI: [name] is a 62 y.o. female presenting on 07/03/2017 for Follow-up (12 week rck ); Hypertension; and Adenopathy  This patient comes in for periodic recheck on medications and conditions including degenerative disc disease, osteoarthritis, new adenopathy.   All medications are reviewed today. There are no reports of any problems with the medications. All of the medical conditions are reviewed and updated.  Lab work is reviewed and will be ordered as medically necessary.  Overall her pain control has been much less effective.  We have increase and decrease her medicine over the years.  They will increase it over the next few months.  Patient also has generalized fatigue and persistent enlargement to lymph node on her left anterior cervical chain.  It is been there for several weeks.  She states it is tender at times.  She denies any fever or chills.  She denies any severe upper respiratory infections that preceded it.  She has not had sinus pressure.  She does not have problems with long-term allergies.  We have discussed the possibility of a lymphadenitis type infection versus something more chronic.  We are going to go ahead and treat her with an antibiotic at this time but also make a referral for ear nose and throat specialist.  Relevant past medical, surgical, family and social history reviewed and updated as indicated. Allergies and medications reviewed and updated.  Past Medical History:  Diagnosis Date  . Anxiety   . Arthritis   . GERD (gastroesophageal reflux disease)   . Hyperlipidemia   . Hypertension     Past Surgical History:  Procedure Laterality Date  . ABDOMINAL HYSTERECTOMY    . CERVICAL FUSION    . KNEE ARTHROSCOPY Left   . LUMBAR  FUSION      Review of Systems  Constitutional: Negative.  Negative for activity change, fatigue and fever.  HENT: Positive for trouble swallowing. Negative for sinus pressure, sore throat and voice change.   Eyes: Negative.   Respiratory: Negative.  Negative for cough.   Cardiovascular: Negative.  Negative for chest pain.  Gastrointestinal: Negative.  Negative for abdominal pain.  Endocrine: Negative.   Genitourinary: Negative.  Negative for dysuria.  Musculoskeletal: Positive for arthralgias and back pain.  Skin: Negative.   Neurological: Negative.     Allergies as of 07/03/2017   No Known Allergies     Medication List        Accurate as of 07/03/17 11:59 PM. Always use your most recent med list.          amitriptyline 50 MG tablet Commonly known as:  ELAVIL Take 1 tablet (50 mg total) by mouth at bedtime.   aspirin 81 MG chewable tablet Chew 81 mg by mouth daily.   atenolol 25 MG tablet Commonly known as:  TENORMIN   clindamycin 300 MG capsule Commonly known as:  CLEOCIN Take 1 capsule (300 mg total) by mouth 3 (three) times daily.   DEXILANT 60 MG capsule Generic drug:  dexlansoprazole Take 1 capsule by mouth daily.   diazepam 5 MG tablet Commonly known as:  VALIUM Take 1 tablet (5 mg total)  3 (three) times daily as needed by mouth.   hydrochlorothiazide 25 MG tablet Commonly known as:  HYDRODIURIL Take 25 mg by mouth daily.   isosorbide mononitrate 60 MG 24 hr tablet Commonly known as:  IMDUR Take 60 mg by mouth daily.   oxyCODONE 60 MG 12 hr tablet Commonly known as:  OXYCONTIN Take 60 mg by mouth every 12 (twelve) hours.   oxyCODONE 60 MG 12 hr tablet Commonly known as:  OXYCONTIN Take 60 mg by mouth every 12 (twelve) hours.   oxyCODONE 60 MG 12 hr tablet Commonly known as:  OXYCONTIN Take 60 mg by mouth 2 (two) times daily.   potassium chloride 10 MEQ tablet Commonly known as:  K-DUR Take 10 mEq by mouth daily.   rosuvastatin 5 MG  tablet Commonly known as:  CRESTOR Take 5 mg by mouth daily.   topiramate 100 MG tablet Commonly known as:  TOPAMAX Take 1 tablet (100 mg total) by mouth 2 (two) times daily.          Objective:    BP 115/69   Pulse (!) 102   Temp 99 F (37.2 C) (Oral)   Ht 5\' 6"  (1.676 m)   Wt 193 lb 12.8 oz (87.9 kg)   BMI 31.28 kg/m   No Known Allergies  Physical Exam  Constitutional: She is oriented to person, place, and time. She appears well-developed and well-nourished.  HENT:  Head: Normocephalic and atraumatic.  Eyes: Conjunctivae and EOM are normal. Pupils are equal, round, and reactive to light.  Neck: No thyroid mass and no thyromegaly present.    Enlarged mildly tender node in the anterior cervical chain.  No other lymph nodes are palpable in the neck or supraclavicular area  Cardiovascular: Normal rate, regular rhythm, normal heart sounds and intact distal pulses.  Pulmonary/Chest: Effort normal and breath sounds normal.  Abdominal: Soft. Bowel sounds are normal.  Neurological: She is alert and oriented to person, place, and time. She has normal reflexes.  Skin: Skin is warm and dry. No rash noted.  Psychiatric: She has a normal mood and affect. Her behavior is normal. Judgment and thought content normal.  Nursing note and vitals reviewed.   Results for orders placed or performed in visit on 07/03/17  CBC with Differential/Platelet  Result Value Ref Range   WBC 13.1 (H) 3.4 - 10.8 x10E3/uL   RBC 4.34 3.77 - 5.28 x10E6/uL   Hemoglobin 14.2 11.1 - 15.9 g/dL   Hematocrit 41.5 34.0 - 46.6 %   MCV 96 79 - 97 fL   MCH 32.7 26.6 - 33.0 pg   MCHC 34.2 31.5 - 35.7 g/dL   RDW 13.2 12.3 - 15.4 %   Platelets 362 150 - 379 x10E3/uL   Neutrophils 64 Not Estab. %   Lymphs 20 Not Estab. %   Monocytes 10 Not Estab. %   Eos 4 Not Estab. %   Basos 1 Not Estab. %   Neutrophils Absolute 8.6 (H) 1.4 - 7.0 x10E3/uL   Lymphocytes Absolute 2.6 0.7 - 3.1 x10E3/uL   Monocytes Absolute  1.3 (H) 0.1 - 0.9 x10E3/uL   EOS (ABSOLUTE) 0.5 (H) 0.0 - 0.4 x10E3/uL   Basophils Absolute 0.1 0.0 - 0.2 x10E3/uL   Immature Granulocytes 1 Not Estab. %   Immature Grans (Abs) 0.1 0.0 - 0.1 x10E3/uL  Thyroid Panel With TSH  Result Value Ref Range   TSH 2.670 0.450 - 4.500 uIU/mL   T4, Total 8.0 4.5 - 12.0 ug/dL  T3 Uptake Ratio 21 (L) 24 - 39 %   Free Thyroxine Index 1.7 1.2 - 4.9      Assessment & Plan:   1. DDD (degenerative disc disease), lumbar - oxyCODONE (OXYCONTIN) 60 MG 12 hr tablet; Take 60 mg by mouth every 12 (twelve) hours.  Dispense: 60 each; Refill: 0 - oxyCODONE (OXYCONTIN) 60 MG 12 hr tablet; Take 60 mg by mouth every 12 (twelve) hours.  Dispense: 60 each; Refill: 0 - oxyCODONE (OXYCONTIN) 60 MG 12 hr tablet; Take 60 mg by mouth 2 (two) times daily.  Dispense: 60 each; Refill: 0  2. Osteoarthritis of spine with radiculopathy, lumbosacral region - oxyCODONE (OXYCONTIN) 60 MG 12 hr tablet; Take 60 mg by mouth every 12 (twelve) hours.  Dispense: 60 each; Refill: 0 - oxyCODONE (OXYCONTIN) 60 MG 12 hr tablet; Take 60 mg by mouth every 12 (twelve) hours.  Dispense: 60 each; Refill: 0 - oxyCODONE (OXYCONTIN) 60 MG 12 hr tablet; Take 60 mg by mouth 2 (two) times daily.  Dispense: 60 each; Refill: 0  3. Other fatigue - CBC with Differential/Platelet - Thyroid Panel With TSH - Ambulatory referral to ENT  4. Lymph node enlargement - CBC with Differential/Platelet - Thyroid Panel With TSH - Ambulatory referral to ENT    Current Outpatient Medications:  .  amitriptyline (ELAVIL) 50 MG tablet, Take 1 tablet (50 mg total) by mouth at bedtime., Disp: 30 tablet, Rfl: 11 .  aspirin 81 MG chewable tablet, Chew 81 mg by mouth daily., Disp: , Rfl:  .  atenolol (TENORMIN) 25 MG tablet, , Disp: , Rfl: 2 .  DEXILANT 60 MG capsule, Take 1 capsule by mouth daily., Disp: , Rfl: 0 .  diazepam (VALIUM) 5 MG tablet, Take 1 tablet (5 mg total) 3 (three) times daily as needed by mouth.,  Disp: 90 tablet, Rfl: 5 .  hydrochlorothiazide (HYDRODIURIL) 25 MG tablet, Take 25 mg by mouth daily., Disp: , Rfl:  .  isosorbide mononitrate (IMDUR) 60 MG 24 hr tablet, Take 60 mg by mouth daily., Disp: , Rfl:  .  oxyCODONE (OXYCONTIN) 60 MG 12 hr tablet, Take 60 mg by mouth every 12 (twelve) hours., Disp: 60 each, Rfl: 0 .  oxyCODONE (OXYCONTIN) 60 MG 12 hr tablet, Take 60 mg by mouth every 12 (twelve) hours., Disp: 60 each, Rfl: 0 .  oxyCODONE (OXYCONTIN) 60 MG 12 hr tablet, Take 60 mg by mouth 2 (two) times daily., Disp: 60 each, Rfl: 0 .  potassium chloride (K-DUR) 10 MEQ tablet, Take 10 mEq by mouth daily., Disp: , Rfl: 0 .  rosuvastatin (CRESTOR) 5 MG tablet, Take 5 mg by mouth daily., Disp: , Rfl:  .  topiramate (TOPAMAX) 100 MG tablet, Take 1 tablet (100 mg total) by mouth 2 (two) times daily., Disp: 60 tablet, Rfl: 11 .  clindamycin (CLEOCIN) 300 MG capsule, Take 1 capsule (300 mg total) by mouth 3 (three) times daily., Disp: 30 capsule, Rfl: 0 Continue all other maintenance medications as listed above.  Follow up plan: Return in about 12 weeks (around 09/25/2017) for recheck.  Educational handout given for [place] PA-C [place] [address]  [place], [place] [address] [phone]   07/04/2017, 8:14 AM

## 2017-08-20 ENCOUNTER — Ambulatory Visit: Payer: BLUE CROSS/BLUE SHIELD | Admitting: Physician Assistant

## 2017-08-20 ENCOUNTER — Encounter: Payer: Self-pay | Admitting: Physician Assistant

## 2017-08-20 VITALS — BP 110/72 | HR 101 | Temp 97.3°F | Ht 66.0 in | Wt 191.0 lb

## 2017-08-20 DIAGNOSIS — M5136 Other intervertebral disc degeneration, lumbar region: Secondary | ICD-10-CM

## 2017-08-20 DIAGNOSIS — M130 Polyarthritis, unspecified: Secondary | ICD-10-CM

## 2017-08-20 DIAGNOSIS — M7071 Other bursitis of hip, right hip: Secondary | ICD-10-CM

## 2017-08-20 DIAGNOSIS — I25118 Atherosclerotic heart disease of native coronary artery with other forms of angina pectoris: Secondary | ICD-10-CM | POA: Diagnosis not present

## 2017-08-20 NOTE — Progress Notes (Signed)
BP 110/72 (BP Location: Left Arm)   Pulse (!) 101   Temp (!) 97.3 F (36.3 C) (Oral)   Ht 5\' 6"  (1.676 m)   Wt 191 lb (86.6 kg)   BMI 30.83 kg/m    Subjective:    Patient ID: [name], female    DOB: 1956-04-23, 62 y.o.   MRN: [id number]  HPI: [name] is a 62 y.o. female presenting on 08/20/2017 for Follow-up (discuss meds and forms)  Chronic pain is under control, patient has no new complaints. Medications are keeping things stable. Needs refills for the next three months.  [place] Controlled Substance website checked and normal. Drug screen normal this year.   This patient has received a letter from [employer] and [employer] [facility] [place] concerning her OxyContin.  As of September 10, 2017 a letter of medical necessity needs to be sent to [facility] and [facility].  This patient does medically needed this medication due to her degenerative disc disease of the lumbar spine that has required multiple surgeries and is still very severe.  Also she has bursitis of the hips and arthritis throughout many joints, knees, hips, hands and neck.  She has had a cervical fusion performed in the past.  Including.  The patient also has significant coronary artery disease she cannot take NSAIDs.  They would greatly aggravate her CAD.  In the past she has tried multiple NSAIDs, hydrocodone, codeine, Ultram.  She is also tried over-the-counter medicines with no relief.  None of these has given her as much relief as the OxyContin.  She does take Valium on an as-needed basis for spasms.  Past Medical History:  Diagnosis Date  . Anxiety   . Arthritis   . GERD (gastroesophageal reflux disease)   . Hyperlipidemia   . Hypertension    Relevant past medical, surgical, family and social history reviewed and updated as indicated. Interim medical history since our last visit reviewed. Allergies and medications reviewed and updated. DATA REVIEWED: CHART IN EPIC  Family History reviewed  for pertinent findings.  Review of Systems  Constitutional: Positive for activity change and fatigue. Negative for fever.  HENT: Negative.   Eyes: Negative.   Respiratory: Negative.  Negative for cough.   Cardiovascular: Negative.  Negative for chest pain.  Gastrointestinal: Negative.  Negative for abdominal pain.  Endocrine: Negative.   Genitourinary: Negative.  Negative for dysuria.  Musculoskeletal: Positive for arthralgias, back pain, gait problem, joint swelling, myalgias, neck pain and neck stiffness.  Skin: Negative.     Allergies as of 08/20/2017   No Known Allergies     Medication List        Accurate as of 08/20/17 11:59 PM. Always use your most recent med list.          amitriptyline 50 MG tablet Commonly known as:  ELAVIL Take 1 tablet (50 mg total) by mouth at bedtime.   aspirin 81 MG chewable tablet Chew 81 mg by mouth daily.   atenolol 25 MG tablet Commonly known as:  TENORMIN   clindamycin 300 MG capsule Commonly known as:  CLEOCIN Take 1 capsule (300 mg total) by mouth 3 (three) times daily.   DEXILANT 60 MG capsule Generic drug:  dexlansoprazole Take 1 capsule by mouth daily.   diazepam 5 MG tablet Commonly known as:  VALIUM Take 1 tablet (5 mg total) 3 (three) times daily as needed by mouth.   hydrochlorothiazide 25 MG tablet Commonly  known as:  HYDRODIURIL Take 25 mg by mouth daily.   isosorbide mononitrate 60 MG 24 hr tablet Commonly known as:  IMDUR Take 60 mg by mouth daily.   oxyCODONE 60 MG 12 hr tablet Commonly known as:  OXYCONTIN Take 60 mg by mouth every 12 (twelve) hours.   oxyCODONE 60 MG 12 hr tablet Commonly known as:  OXYCONTIN Take 60 mg by mouth every 12 (twelve) hours.   oxyCODONE 60 MG 12 hr tablet Commonly known as:  OXYCONTIN Take 60 mg by mouth 2 (two) times daily.   potassium chloride 10 MEQ tablet Commonly known as:  K-DUR Take 10 mEq by mouth daily.   rosuvastatin 5 MG tablet Commonly known as:   CRESTOR Take 5 mg by mouth daily.   topiramate 100 MG tablet Commonly known as:  TOPAMAX Take 1 tablet (100 mg total) by mouth 2 (two) times daily.          Objective:    BP 110/72 (BP Location: Left Arm)   Pulse (!) 101   Temp (!) 97.3 F (36.3 C) (Oral)   Ht 5\' 6"  (1.676 m)   Wt 191 lb (86.6 kg)   BMI 30.83 kg/m   No Known Allergies  Wt Readings from Last 3 Encounters:  08/20/17 191 lb (86.6 kg)  07/03/17 193 lb 12.8 oz (87.9 kg)  04/10/17 192 lb (87.1 kg)    Physical Exam  Constitutional: She is oriented to person, place, and time. She appears well-developed and well-nourished.  HENT:  Head: Normocephalic and atraumatic.  Eyes: Conjunctivae and EOM are normal. Pupils are equal, round, and reactive to light.  Cardiovascular: Normal rate, regular rhythm, normal heart sounds and intact distal pulses.  Pulmonary/Chest: Effort normal and breath sounds normal.  Abdominal: Soft. Bowel sounds are normal.  Musculoskeletal:       Right knee: She exhibits decreased range of motion and swelling. Tenderness found.       Left knee: She exhibits decreased range of motion and swelling. Tenderness found.       Lumbar back: She exhibits decreased range of motion, deformity, pain and spasm.       Back:  Neurological: She is alert and oriented to person, place, and time. She has normal reflexes.  Skin: Skin is warm and dry. No rash noted.  Psychiatric: She has a normal mood and affect. Her behavior is normal. Judgment and thought content normal.    Results for orders placed or performed in visit on 07/03/17  CBC with Differential/Platelet  Result Value Ref Range   WBC 13.1 (H) 3.4 - 10.8 x10E3/uL   RBC 4.34 3.77 - 5.28 x10E6/uL   Hemoglobin 14.2 11.1 - 15.9 g/dL   Hematocrit 41.5 34.0 - 46.6 %   MCV 96 79 - 97 fL   MCH 32.7 26.6 - 33.0 pg   MCHC 34.2 31.5 - 35.7 g/dL   RDW 13.2 12.3 - 15.4 %   Platelets 362 150 - 379 x10E3/uL   Neutrophils 64 Not Estab. %   Lymphs 20 Not  Estab. %   Monocytes 10 Not Estab. %   Eos 4 Not Estab. %   Basos 1 Not Estab. %   Neutrophils Absolute 8.6 (H) 1.4 - 7.0 x10E3/uL   Lymphocytes Absolute 2.6 0.7 - 3.1 x10E3/uL   Monocytes Absolute 1.3 (H) 0.1 - 0.9 x10E3/uL   EOS (ABSOLUTE) 0.5 (H) 0.0 - 0.4 x10E3/uL   Basophils Absolute 0.1 0.0 - 0.2 x10E3/uL   Immature Granulocytes 1  Not Estab. %   Immature Grans (Abs) 0.1 0.0 - 0.1 x10E3/uL  Thyroid Panel With TSH  Result Value Ref Range   TSH 2.670 0.450 - 4.500 uIU/mL   T4, Total 8.0 4.5 - 12.0 ug/dL   T3 Uptake Ratio 21 (L) 24 - 39 %   Free Thyroxine Index 1.7 1.2 - 4.9      Assessment & Plan:   1. Coronary artery disease of native heart with stable angina pectoris, unspecified vessel or lesion type ([place])  2. DDD (degenerative disc disease), lumbar  3. Bursitis of other bursa of right hip  4. Arthritis of multiple sites    Follow up plan: No Follow-up on file.  Educational handout given for  Terald Sleeper PA-C [place] [address]  [place], [place] [address] [phone]   08/21/2017, 11:58 AM

## 2017-08-21 DIAGNOSIS — M0609 Rheumatoid arthritis without rheumatoid factor, multiple sites: Secondary | ICD-10-CM | POA: Insufficient documentation

## 2017-09-19 ENCOUNTER — Ambulatory Visit: Payer: BLUE CROSS/BLUE SHIELD | Admitting: Physician Assistant

## 2017-09-19 ENCOUNTER — Encounter: Payer: Self-pay | Admitting: Physician Assistant

## 2017-09-19 VITALS — BP 123/80 | HR 96 | Temp 97.5°F | Ht 66.0 in | Wt 192.8 lb

## 2017-09-19 DIAGNOSIS — M7711 Lateral epicondylitis, right elbow: Secondary | ICD-10-CM | POA: Diagnosis not present

## 2017-09-19 DIAGNOSIS — M4727 Other spondylosis with radiculopathy, lumbosacral region: Secondary | ICD-10-CM | POA: Diagnosis not present

## 2017-09-19 DIAGNOSIS — M5136 Other intervertebral disc degeneration, lumbar region: Secondary | ICD-10-CM | POA: Diagnosis not present

## 2017-09-19 MED ORDER — DIAZEPAM 5 MG PO TABS: 5.0000 mg | ORAL_TABLET | Freq: Three times a day (TID) | ORAL | 5 refills | Status: DC | PRN

## 2017-09-19 MED ORDER — OXYCODONE HCL ER 60 MG PO T12A: 60.0000 mg | EXTENDED_RELEASE_TABLET | Freq: Two times a day (BID) | ORAL | 0 refills | Status: DC

## 2017-09-19 NOTE — Progress Notes (Signed)
BP 123/80   Pulse 96   Temp (!) 97.5 F (36.4 C) (Oral)   Ht 5\' 6"  (1.676 m)   Wt 192 lb 12.8 oz (87.5 kg)   BMI 31.12 kg/m    Subjective:    Patient ID: [name], female    DOB: June 12, 1955, 62 y.o.   MRN: [id number]  HPI: [name] is a 62 y.o. female presenting on 09/19/2017 for Follow-up (3 month )  Chronic pain is under control, patient has no new complaints. Medications are keeping things stable. Needs refills for the next three months.  [place] Controlled Substance website checked and normal. Drug screen normal this year.  Right elbow with pain on the lateral portion, movement and twisting worsens. No known injury.  Past Medical History:  Diagnosis Date  . Anxiety   . Arthritis   . GERD (gastroesophageal reflux disease)   . Hyperlipidemia   . Hypertension    Relevant past medical, surgical, family and social history reviewed and updated as indicated. Interim medical history since our last visit reviewed. Allergies and medications reviewed and updated. DATA REVIEWED: CHART IN EPIC  Family History reviewed for pertinent findings.  Review of Systems  Constitutional: Negative.   HENT: Negative.   Eyes: Negative.   Respiratory: Negative.   Gastrointestinal: Negative.   Genitourinary: Negative.   Musculoskeletal: Positive for arthralgias, back pain, joint swelling and myalgias.    Allergies as of 09/19/2017   No Known Allergies     Medication List        Accurate as of 09/19/17  5:01 PM. Always use your most recent med list.          amitriptyline 50 MG tablet Commonly known as:  ELAVIL Take 1 tablet (50 mg total) by mouth at bedtime.   aspirin 81 MG chewable tablet Chew 81 mg by mouth daily.   atenolol 25 MG tablet Commonly known as:  TENORMIN   DEXILANT 60 MG capsule Generic drug:  dexlansoprazole Take 1 capsule by mouth daily.   diazepam 5 MG tablet Commonly known as:  VALIUM Take 1 tablet (5 mg total) by mouth 3 (three) times daily as  needed.   hydrochlorothiazide 25 MG tablet Commonly known as:  HYDRODIURIL Take 25 mg by mouth daily.   isosorbide mononitrate 60 MG 24 hr tablet Commonly known as:  IMDUR Take 60 mg by mouth daily.   oxyCODONE 60 MG 12 hr tablet Commonly known as:  OXYCONTIN Take 60 mg by mouth every 12 (twelve) hours.   oxyCODONE 60 MG 12 hr tablet Commonly known as:  OXYCONTIN Take 60 mg by mouth every 12 (twelve) hours.   oxyCODONE 60 MG 12 hr tablet Commonly known as:  OXYCONTIN Take 60 mg by mouth 2 (two) times daily.   potassium chloride 10 MEQ tablet Commonly known as:  K-DUR Take 10 mEq by mouth daily.   rosuvastatin 5 MG tablet Commonly known as:  CRESTOR Take 5 mg by mouth daily.   topiramate 100 MG tablet Commonly known as:  TOPAMAX Take 1 tablet (100 mg total) by mouth 2 (two) times daily.          Objective:    BP 123/80   Pulse 96   Temp (!) 97.5 F (36.4 C) (Oral)   Ht 5\' 6"  (1.676 m)   Wt 192 lb 12.8 oz (87.5 kg)   BMI 31.12 kg/m   No Known Allergies  Wt Readings from Last 3 Encounters:  09/19/17 192 lb  12.8 oz (87.5 kg)  08/20/17 191 lb (86.6 kg)  07/03/17 193 lb 12.8 oz (87.9 kg)    Physical Exam  Constitutional: She is oriented to person, place, and time. She appears well-developed and well-nourished.  HENT:  Head: Normocephalic and atraumatic.  Eyes: Pupils are equal, round, and reactive to light. Conjunctivae and EOM are normal.  Cardiovascular: Normal rate, regular rhythm, normal heart sounds and intact distal pulses.  Pulmonary/Chest: Effort normal and breath sounds normal.  Abdominal: Soft. Bowel sounds are normal.  Musculoskeletal:       Right elbow: She exhibits swelling. Tenderness found. Lateral epicondyle tenderness noted.       Lumbar back: She exhibits decreased range of motion, tenderness, pain and spasm.       Arms: Neurological: She is alert and oriented to person, place, and time. She has normal reflexes.  Skin: Skin is warm and  dry. No rash noted.  Psychiatric: She has a normal mood and affect. Her behavior is normal. Judgment and thought content normal.    Results for orders placed or performed in visit on 07/03/17  CBC with Differential/Platelet  Result Value Ref Range   WBC 13.1 (H) 3.4 - 10.8 x10E3/uL   RBC 4.34 3.77 - 5.28 x10E6/uL   Hemoglobin 14.2 11.1 - 15.9 g/dL   Hematocrit 41.5 34.0 - 46.6 %   MCV 96 79 - 97 fL   MCH 32.7 26.6 - 33.0 pg   MCHC 34.2 31.5 - 35.7 g/dL   RDW 13.2 12.3 - 15.4 %   Platelets 362 150 - 379 x10E3/uL   Neutrophils 64 Not Estab. %   Lymphs 20 Not Estab. %   Monocytes 10 Not Estab. %   Eos 4 Not Estab. %   Basos 1 Not Estab. %   Neutrophils Absolute 8.6 (H) 1.4 - 7.0 x10E3/uL   Lymphocytes Absolute 2.6 0.7 - 3.1 x10E3/uL   Monocytes Absolute 1.3 (H) 0.1 - 0.9 x10E3/uL   EOS (ABSOLUTE) 0.5 (H) 0.0 - 0.4 x10E3/uL   Basophils Absolute 0.1 0.0 - 0.2 x10E3/uL   Immature Granulocytes 1 Not Estab. %   Immature Grans (Abs) 0.1 0.0 - 0.1 x10E3/uL  Thyroid Panel With TSH  Result Value Ref Range   TSH 2.670 0.450 - 4.500 uIU/mL   T4, Total 8.0 4.5 - 12.0 ug/dL   T3 Uptake Ratio 21 (L) 24 - 39 %   Free Thyroxine Index 1.7 1.2 - 4.9      Assessment & Plan:   1. DDD (degenerative disc disease), lumbar - oxyCODONE (OXYCONTIN) 60 MG 12 hr tablet; Take 60 mg by mouth every 12 (twelve) hours.  Dispense: 60 each; Refill: 0 - oxyCODONE (OXYCONTIN) 60 MG 12 hr tablet; Take 60 mg by mouth every 12 (twelve) hours.  Dispense: 60 each; Refill: 0 - oxyCODONE (OXYCONTIN) 60 MG 12 hr tablet; Take 60 mg by mouth 2 (two) times daily.  Dispense: 60 each; Refill: 0 - diazepam (VALIUM) 5 MG tablet; Take 1 tablet (5 mg total) by mouth 3 (three) times daily as needed.  Dispense: 90 tablet; Refill: 5  2. Osteoarthritis of spine with radiculopathy, lumbosacral region - oxyCODONE (OXYCONTIN) 60 MG 12 hr tablet; Take 60 mg by mouth every 12 (twelve) hours.  Dispense: 60 each; Refill: 0 - oxyCODONE  (OXYCONTIN) 60 MG 12 hr tablet; Take 60 mg by mouth every 12 (twelve) hours.  Dispense: 60 each; Refill: 0 - oxyCODONE (OXYCONTIN) 60 MG 12 hr tablet; Take 60 mg by mouth 2 (  two) times daily.  Dispense: 60 each; Refill: 0  3. Lateral epicondylitis of right elbow   Continue all other maintenance medications as listed above.  Follow up plan: Return in about 3 months (around 12/19/2017) for recheck.  Educational handout given for [place] PA-C [place] [address]  [place], [place] [address] [phone]   09/19/2017, 5:01 PM

## 2017-09-19 NOTE — Patient Instructions (Addendum)
[facility] [place] Ask your health care provider which exercises are safe for you. Do exercises exactly as told by your health care provider and adjust them as directed. It is normal to feel mild stretching, pulling, tightness, or discomfort as you do these exercises, but you should stop right away if you feel sudden pain or your pain gets worse. Do not begin these exercises until told by your health care provider. Stretching and range of motion exercises These exercises warm up your muscles and joints and improve the movement and flexibility of your elbow. These exercises also help to relieve pain, numbness, and tingling. Exercise A: Wrist extensor stretch 1. Extend your left / right elbow with your fingers pointing down. 2. Gently pull the palm of your left / right hand toward you until you feel a gentle stretch on the top of your forearm. 3. To increase the stretch, push your left / right hand toward the outer edge or pinkie side of your forearm. 4. Hold this position for __________ seconds. Repeat __________ times. Complete this exercise __________ times a day. If directed by your health care provider, repeat this stretch except do it with a bent elbow this time. Exercise B: Wrist flexor stretch  1. Extend your left / right elbow and turn your palm upward. 2. Gently pull your left / right palm and fingertips back so your wrist extends and your fingers point more toward the ground. 3. You should feel a gentle stretch on the inside of your forearm. 4. Hold this position for __________ seconds. Repeat __________ times. Complete this exercise __________ times a day. If directed by your health care provider, repeat this stretch except do it with a bent elbow this time. Strengthening exercises These exercises build strength and endurance in your elbow. Endurance is the ability to use your muscles for a long time, even after they get tired. Exercise C: Wrist  extensors  1. Sit with your left / right forearm palm-down and fully supported on a table or countertop. Your elbow should be resting below the height of your shoulder. 2. Let your left / right wrist extend over the edge of the surface. 3. Loosely hold a __________ weight or a piece of rubber exercise band or tubing in your left / right hand. Slowly curl your left / right hand up toward your forearm. If you are using band or tubing, hold the band or tubing in place with your other hand to provide resistance. 4. Hold this position for __________ seconds. 5. Slowly return to the starting position. Repeat __________ times. Complete this exercise __________ times a day. Exercise D: Radial deviators  1. Stand with a __________ weight in your left / righthand. Or, sit while holding a rubber exercise band or tubing with your other arm supported on a table or countertop. Position your hand so your thumb is on top. 2. Raise your hand upward in front of you so your thumb travels toward your forearm, or pull up on the rubber tubing. 3. Hold this position for __________ seconds. 4. Slowly return to the starting position. Repeat __________ times. Complete this exercise __________ times a day. Exercise E: Eccentric wrist extensors 1. Sit with your left / right forearm palm-down and fully supported on a table or countertop. Your elbow should be resting below the height of your shoulder. 2. If told by your health care provider, hold a __________ weight in your hand. 3. Let your left /  right wrist extend over the edge of the surface. 4. Use your other hand to lift up your left / right hand toward your forearm. Keep your forearm on the table. 5. Using only the muscles in your left / right hand, slowly lower your hand back down to the starting position. Repeat __________ times. Complete this exercise __________ times a day. This information is not intended to replace advice given to you by your health care  provider. Make sure you discuss any questions you have with your health care provider. Document Released: 05/22/2005 Document Revised: 01/26/2016 Document Reviewed: 02/18/2015 Elsevier Interactive Patient Education  [employer].

## 2017-10-05 ENCOUNTER — Other Ambulatory Visit: Payer: Self-pay | Admitting: Physician Assistant

## 2017-10-05 ENCOUNTER — Telehealth: Payer: Self-pay | Admitting: Physician Assistant

## 2017-10-05 MED ORDER — OXYCODONE HCL ER 60 MG PO T12A: 60.0000 mg | EXTENDED_RELEASE_TABLET | Freq: Two times a day (BID) | ORAL | 0 refills | Status: AC

## 2017-10-05 NOTE — Telephone Encounter (Signed)
Patient took her last one this morning and is now completely out =.

## 2017-10-05 NOTE — Telephone Encounter (Signed)
I have not seen one, Debbie?

## 2017-10-05 NOTE — Telephone Encounter (Signed)
Looks like prior Josem Kaufmann has gone for review so may take 7 days for approval. Is there anything we can give until this is finished?

## 2017-10-05 NOTE — Telephone Encounter (Signed)
7 days sent

## 2017-10-05 NOTE — Telephone Encounter (Signed)
Yes [name] is working on prior [name]

## 2017-10-05 NOTE — Telephone Encounter (Signed)
Did her pharmacy send the paper?

## 2017-10-10 ENCOUNTER — Telehealth: Payer: Self-pay

## 2017-10-10 ENCOUNTER — Other Ambulatory Visit: Payer: Self-pay | Admitting: Physician Assistant

## 2017-10-10 MED ORDER — OXYCODONE-ACETAMINOPHEN 10-325 MG PO TABS: 2.0000 | ORAL_TABLET | ORAL | 0 refills | Status: DC | PRN

## 2017-10-10 NOTE — Telephone Encounter (Signed)
We will need to stop the valium and use a different muscle relaxant. In 12 weeks can retry the oxycontin.

## 2017-10-10 NOTE — Telephone Encounter (Signed)
Can use percocet 10/325 2 tablets every 4 hours for pain.  #360 I am going to send to pharmacy

## 2017-10-10 NOTE — Telephone Encounter (Signed)
Denial of Oxycontin The medication is not covered when used in combination with benzodiazepiine  Coverage will continue for 12 weeks from adverse decision to wean off

## 2017-10-10 NOTE — Telephone Encounter (Signed)
Insurance denied Oxycontin on Cover my meds   Have not received the paper from insurance yet to say why

## 2017-10-10 NOTE — Telephone Encounter (Signed)
What are the other options to get it approved?

## 2017-10-11 NOTE — Telephone Encounter (Signed)
Patient aware of recommendations and will schedule appointment before she runs out

## 2017-11-05 ENCOUNTER — Ambulatory Visit: Payer: BLUE CROSS/BLUE SHIELD | Admitting: Physician Assistant

## 2017-11-05 ENCOUNTER — Encounter: Payer: Self-pay | Admitting: Physician Assistant

## 2017-11-05 DIAGNOSIS — M5136 Other intervertebral disc degeneration, lumbar region: Secondary | ICD-10-CM | POA: Diagnosis not present

## 2017-11-05 DIAGNOSIS — M4727 Other spondylosis with radiculopathy, lumbosacral region: Secondary | ICD-10-CM | POA: Diagnosis not present

## 2017-11-05 MED ORDER — TIZANIDINE HCL 4 MG PO CAPS: 4.0000 mg | ORAL_CAPSULE | Freq: Three times a day (TID) | ORAL | 2 refills | Status: DC | PRN

## 2017-11-05 MED ORDER — OXYCODONE HCL ER 60 MG PO T12A: 60.0000 mg | EXTENDED_RELEASE_TABLET | Freq: Two times a day (BID) | ORAL | 0 refills | Status: DC

## 2017-11-06 NOTE — Progress Notes (Signed)
BP 129/79   Pulse 98   Temp (!) 97.3 F (36.3 C) (Oral)   Ht 5\' 6"  (1.676 m)   Wt 191 lb (86.6 kg)   BMI 30.83 kg/m    Subjective:    Patient ID: [name], female    DOB: [birth year], 62 y.o.   MRN: [id number]  HPI: ARION MORGAN is a 62 y.o. female presenting on 11/05/2017 for Pain (discuss medication )  This patient comes in for periodic recheck on her chronic pain condition.  She has been off of the Valium for several months now.  She does not use it regularly.  We are trying to get her OxyContin approved.  Her insurance did not allow for her to take any type of benzo diazepam along with the OxyContin.  She has been 12 weeks free of this.  Near the end of July will be due to recheck her and to request the prior authorization again.  We received a letter saying that it may eighth 2019 that she needed to have 12weeks free of benzodiazepine with OxyContin.   Past Medical History:  Diagnosis Date  . Anxiety   . Arthritis   . GERD (gastroesophageal reflux disease)   . Hyperlipidemia   . Hypertension    Relevant past medical, surgical, family and social history reviewed and updated as indicated. Interim medical history since our last visit reviewed. Allergies and medications reviewed and updated. DATA REVIEWED: CHART IN EPIC  Family History reviewed for pertinent findings.  Review of Systems  Constitutional: Negative.   HENT: Negative.   Eyes: Negative.   Respiratory: Negative.   Gastrointestinal: Negative.   Genitourinary: Negative.   Musculoskeletal: Positive for arthralgias, back pain, gait problem, joint swelling and myalgias.    Allergies as of 11/05/2017   No Known Allergies     Medication List        Accurate as of 11/05/17 11:59 PM. Always use your most recent med list.          amitriptyline 50 MG tablet Commonly known as:  ELAVIL Take 1 tablet (50 mg total) by mouth at bedtime.   aspirin 81 MG chewable tablet Chew 81 mg by mouth daily.   atenolol  25 MG tablet Commonly known as:  TENORMIN   DEXILANT 60 MG capsule Generic drug:  dexlansoprazole Take 1 capsule by mouth daily.   hydrochlorothiazide 25 MG tablet Commonly known as:  HYDRODIURIL Take 25 mg by mouth daily.   isosorbide mononitrate 60 MG 24 hr tablet Commonly known as:  IMDUR Take 60 mg by mouth daily.   oxyCODONE 60 MG 12 hr tablet Commonly known as:  OXYCONTIN Take 60 mg by mouth every 12 (twelve) hours.   oxyCODONE 60 MG 12 hr tablet Commonly known as:  OXYCONTIN Take 60 mg by mouth every 12 (twelve) hours.   oxyCODONE 60 MG 12 hr tablet Commonly known as:  OXYCONTIN Take 60 mg by mouth 2 (two) times daily.   potassium chloride 10 MEQ tablet Commonly known as:  K-DUR Take 10 mEq by mouth daily.   rosuvastatin 5 MG tablet Commonly known as:  CRESTOR Take 5 mg by mouth daily.   tiZANidine 4 MG capsule Commonly known as:  ZANAFLEX Take 1 capsule (4 mg total) by mouth 3 (three) times daily as needed for muscle spasms.   topiramate 100 MG tablet Commonly known as:  TOPAMAX Take 1 tablet (100 mg total) by mouth 2 (two) times daily.  Objective:    BP 129/79   Pulse 98   Temp (!) 97.3 F (36.3 C) (Oral)   Ht 5\' 6"  (1.676 m)   Wt 191 lb (86.6 kg)   BMI 30.83 kg/m   No Known Allergies  Wt Readings from Last 3 Encounters:  11/05/17 191 lb (86.6 kg)  09/19/17 192 lb 12.8 oz (87.5 kg)  08/20/17 191 lb (86.6 kg)    Physical Exam  Constitutional: She is oriented to person, place, and time. She appears well-developed and well-nourished.  HENT:  Head: Normocephalic and atraumatic.  Eyes: Pupils are equal, round, and reactive to light. Conjunctivae and EOM are normal.  Cardiovascular: Normal rate, regular rhythm, normal heart sounds and intact distal pulses.  Pulmonary/Chest: Effort normal and breath sounds normal.  Abdominal: Soft. Bowel sounds are normal.  Neurological: She is alert and oriented to person, place, and time. She has  normal reflexes.  Skin: Skin is warm and dry. No rash noted.  Psychiatric: She has a normal mood and affect. Her behavior is normal. Judgment and thought content normal.        Assessment & Plan:   1. DDD (degenerative disc disease), lumbar - oxyCODONE (OXYCONTIN) 60 MG 12 hr tablet; Take 60 mg by mouth 2 (two) times daily.  Dispense: 60 each; Refill: 0  2. Osteoarthritis of spine with radiculopathy, lumbosacral region - oxyCODONE (OXYCONTIN) 60 MG 12 hr tablet; Take 60 mg by mouth 2 (two) times daily.  Dispense: 60 each; Refill: 0   Continue all other maintenance medications as listed above.  Follow up plan: No follow-ups on file.  Educational handout given for [place] PA-C [facility] [address]  [place], [place] [address] [phone]   11/06/2017, 1:28 PM

## 2017-12-19 ENCOUNTER — Telehealth: Payer: Self-pay | Admitting: Physician Assistant

## 2017-12-19 ENCOUNTER — Ambulatory Visit: Payer: BLUE CROSS/BLUE SHIELD | Admitting: Physician Assistant

## 2017-12-19 ENCOUNTER — Encounter: Payer: Self-pay | Admitting: Physician Assistant

## 2017-12-19 DIAGNOSIS — M5136 Other intervertebral disc degeneration, lumbar region: Secondary | ICD-10-CM | POA: Diagnosis not present

## 2017-12-19 DIAGNOSIS — M4727 Other spondylosis with radiculopathy, lumbosacral region: Secondary | ICD-10-CM | POA: Diagnosis not present

## 2017-12-19 MED ORDER — OXYCODONE HCL ER 60 MG PO T12A: 60.0000 mg | EXTENDED_RELEASE_TABLET | Freq: Two times a day (BID) | ORAL | 0 refills | Status: DC

## 2017-12-19 MED ORDER — TIZANIDINE HCL 2 MG PO TABS: 2.0000 mg | ORAL_TABLET | Freq: Three times a day (TID) | ORAL | 2 refills | Status: DC | PRN

## 2017-12-19 MED ORDER — CEPHALEXIN 500 MG PO CAPS: 500.0000 mg | ORAL_CAPSULE | Freq: Four times a day (QID) | ORAL | 0 refills | Status: DC

## 2017-12-19 NOTE — Telephone Encounter (Signed)
Patient aware rx sent  

## 2017-12-19 NOTE — Patient Instructions (Signed)
In a few days you may receive a survey in the mail or online from [employer] regarding your visit with us today. Please take a moment to fill this out. Your feedback is very important to our whole office. It can help us better understand your needs as well as improve your experience and satisfaction. Thank you for taking your time to complete it. We care about you.  Nickolas Chalfin, PA-C  

## 2017-12-19 NOTE — Telephone Encounter (Signed)
sent 

## 2017-12-19 NOTE — Progress Notes (Signed)
BP 122/76   Pulse 95   Temp 98.6 F (37 C) (Oral)   Ht 5\' 6"  (1.676 m)   Wt 193 lb 3.2 oz (87.6 kg)   BMI 31.18 kg/m    Subjective:    Patient ID: [name], female    DOB: June 19, 1955, 62 y.o.   MRN: [id number]  HPI: [name] is a 62 y.o. female presenting on 12/19/2017 for Hypertension and Pain (3 month refill)  This patient comes in for 47-month chronic refill on her medication.  She has severe degenerative disc disease with spondylosis.  She has an operable back problem.  She also has other joint degeneration.  She is currently on oxycodone.  Her insurance did not want to approve this medication until she had been off of benzodiazepines for 12 weeks.  She stopped them 3 months ago when we asked her to.  She is no longer taking her Valium.  She is unable to take the Zanaflex 4 mg because it makes her too sleepy.  Have asked her to try Zanaflex 2 mg and to cut them in half to see if this can help with her muscle spasms.  She is agreeing to do that S.  Redness in the refill on to the pharmacy and see what prior authorization may be needed to do.   Past Medical History:  Diagnosis Date  . Anxiety   . Arthritis   . GERD (gastroesophageal reflux disease)   . Hyperlipidemia   . Hypertension    Relevant past medical, surgical, family and social history reviewed and updated as indicated. Interim medical history since our last visit reviewed. Allergies and medications reviewed and updated. DATA REVIEWED: CHART IN EPIC  Family History reviewed for pertinent findings.  Review of Systems  Constitutional: Negative.   HENT: Negative.   Eyes: Negative.   Respiratory: Negative.   Gastrointestinal: Negative.   Genitourinary: Negative.   Musculoskeletal: Positive for arthralgias, back pain, gait problem and joint swelling.  Allergic/Immunologic: Negative for food allergies.    Allergies as of 12/19/2017   No Known Allergies     Medication List        Accurate as of  12/19/17  4:46 PM. Always use your most recent med list.          amitriptyline 50 MG tablet Commonly known as:  ELAVIL Take 1 tablet (50 mg total) by mouth at bedtime.   aspirin 81 MG chewable tablet Chew 81 mg by mouth daily.   atenolol 25 MG tablet Commonly known as:  TENORMIN   cephALEXin 500 MG capsule Commonly known as:  KEFLEX Take 1 capsule (500 mg total) by mouth 4 (four) times daily.   DEXILANT 60 MG capsule Generic drug:  dexlansoprazole Take 1 capsule by mouth daily.   hydrochlorothiazide 25 MG tablet Commonly known as:  HYDRODIURIL Take 25 mg by mouth daily.   isosorbide mononitrate 60 MG 24 hr tablet Commonly known as:  IMDUR Take 60 mg by mouth daily.   oxyCODONE 60 MG 12 hr tablet Commonly known as:  OXYCONTIN Take 60 mg by mouth every 12 (twelve) hours.   oxyCODONE 60 MG 12 hr tablet Commonly known as:  OXYCONTIN Take 60 mg by mouth every 12 (twelve) hours.   oxyCODONE 60 MG 12 hr tablet Commonly known as:  OXYCONTIN Take 60 mg by mouth 2 (two) times daily.   potassium chloride 10 MEQ tablet Commonly known as:  K-DUR Take 10 mEq by mouth daily.  rosuvastatin 5 MG tablet Commonly known as:  CRESTOR Take 5 mg by mouth daily.   tiZANidine 2 MG tablet Commonly known as:  ZANAFLEX Take 1 tablet (2 mg total) by mouth every 8 (eight) hours as needed for muscle spasms.   topiramate 100 MG tablet Commonly known as:  TOPAMAX Take 1 tablet (100 mg total) by mouth 2 (two) times daily.          Objective:    BP 122/76   Pulse 95   Temp 98.6 F (37 C) (Oral)   Ht 5\' 6"  (1.676 m)   Wt 193 lb 3.2 oz (87.6 kg)   BMI 31.18 kg/m   No Known Allergies  Wt Readings from Last 3 Encounters:  12/19/17 193 lb 3.2 oz (87.6 kg)  11/05/17 191 lb (86.6 kg)  09/19/17 192 lb 12.8 oz (87.5 kg)    Physical Exam  Constitutional: She is oriented to person, place, and time. She appears well-developed and well-nourished.  HENT:  Head: Normocephalic and  atraumatic.  Eyes: Pupils are equal, round, and reactive to light. Conjunctivae and EOM are normal.  Cardiovascular: Normal rate, regular rhythm, normal heart sounds and intact distal pulses.  Pulmonary/Chest: Effort normal and breath sounds normal.  Abdominal: Soft. Bowel sounds are normal.  Neurological: She is alert and oriented to person, place, and time. She has normal reflexes.  Skin: Skin is warm and dry. No rash noted.  Psychiatric: She has a normal mood and affect. Her behavior is normal. Judgment and thought content normal.    Results for orders placed or performed in visit on 07/03/17  CBC with Differential/Platelet  Result Value Ref Range   WBC 13.1 (H) 3.4 - 10.8 x10E3/uL   RBC 4.34 3.77 - 5.28 x10E6/uL   Hemoglobin 14.2 11.1 - 15.9 g/dL   Hematocrit 41.5 34.0 - 46.6 %   MCV 96 79 - 97 fL   MCH 32.7 26.6 - 33.0 pg   MCHC 34.2 31.5 - 35.7 g/dL   RDW 13.2 12.3 - 15.4 %   Platelets 362 150 - 379 x10E3/uL   Neutrophils 64 Not Estab. %   Lymphs 20 Not Estab. %   Monocytes 10 Not Estab. %   Eos 4 Not Estab. %   Basos 1 Not Estab. %   Neutrophils Absolute 8.6 (H) 1.4 - 7.0 x10E3/uL   Lymphocytes Absolute 2.6 0.7 - 3.1 x10E3/uL   Monocytes Absolute 1.3 (H) 0.1 - 0.9 x10E3/uL   EOS (ABSOLUTE) 0.5 (H) 0.0 - 0.4 x10E3/uL   Basophils Absolute 0.1 0.0 - 0.2 x10E3/uL   Immature Granulocytes 1 Not Estab. %   Immature Grans (Abs) 0.1 0.0 - 0.1 x10E3/uL  Thyroid Panel With TSH  Result Value Ref Range   TSH 2.670 0.450 - 4.500 uIU/mL   T4, Total 8.0 4.5 - 12.0 ug/dL   T3 Uptake Ratio 21 (L) 24 - 39 %   Free Thyroxine Index 1.7 1.2 - 4.9      Assessment & Plan:   1. DDD (degenerative disc disease), lumbar - tiZANidine (ZANAFLEX) 2 MG tablet; Take 1 tablet (2 mg total) by mouth every 8 (eight) hours as needed for muscle spasms.  Dispense: 90 tablet; Refill: 2 - oxyCODONE (OXYCONTIN) 60 MG 12 hr tablet; Take 60 mg by mouth every 12 (twelve) hours.  Dispense: 60 each; Refill:  0 - oxyCODONE (OXYCONTIN) 60 MG 12 hr tablet; Take 60 mg by mouth every 12 (twelve) hours.  Dispense: 60 each; Refill: 0 - oxyCODONE (  OXYCONTIN) 60 MG 12 hr tablet; Take 60 mg by mouth 2 (two) times daily.  Dispense: 60 each; Refill: 0  2. Osteoarthritis of spine with radiculopathy, lumbosacral region - tiZANidine (ZANAFLEX) 2 MG tablet; Take 1 tablet (2 mg total) by mouth every 8 (eight) hours as needed for muscle spasms.  Dispense: 90 tablet; Refill: 2 - oxyCODONE (OXYCONTIN) 60 MG 12 hr tablet; Take 60 mg by mouth every 12 (twelve) hours.  Dispense: 60 each; Refill: 0 - oxyCODONE (OXYCONTIN) 60 MG 12 hr tablet; Take 60 mg by mouth every 12 (twelve) hours.  Dispense: 60 each; Refill: 0 - oxyCODONE (OXYCONTIN) 60 MG 12 hr tablet; Take 60 mg by mouth 2 (two) times daily.  Dispense: 60 each; Refill: 0   Continue all other maintenance medications as listed above.  Follow up plan: Return in about 3 months (around 03/21/2018) for recheck.  Educational handout given for [place] PA-C [facility] [address]  [place], [place] [address] [phone]   12/19/2017, 4:46 PM

## 2018-01-08 ENCOUNTER — Telehealth: Payer: Self-pay | Admitting: Physician Assistant

## 2018-01-08 NOTE — Telephone Encounter (Signed)
Being worked on  In review with insurance

## 2018-01-10 ENCOUNTER — Emergency Department (HOSPITAL_COMMUNITY)
Admission: EM | Admit: 2018-01-10 | Discharge: 2018-01-10 | Disposition: A | Discharge status: Home / Self Care | Payer: BLUE CROSS/BLUE SHIELD | Attending: Emergency Medicine | Admitting: Emergency Medicine

## 2018-01-10 ENCOUNTER — Encounter (HOSPITAL_COMMUNITY): Payer: Self-pay

## 2018-01-10 ENCOUNTER — Emergency Department (HOSPITAL_COMMUNITY): Payer: BLUE CROSS/BLUE SHIELD

## 2018-01-10 ENCOUNTER — Other Ambulatory Visit: Payer: Self-pay

## 2018-01-10 DIAGNOSIS — I10 Essential (primary) hypertension: Secondary | ICD-10-CM | POA: Diagnosis not present

## 2018-01-10 DIAGNOSIS — K219 Gastro-esophageal reflux disease without esophagitis: Secondary | ICD-10-CM | POA: Insufficient documentation

## 2018-01-10 DIAGNOSIS — R0789 Other chest pain: Secondary | ICD-10-CM | POA: Diagnosis present

## 2018-01-10 DIAGNOSIS — Z79899 Other long term (current) drug therapy: Secondary | ICD-10-CM | POA: Insufficient documentation

## 2018-01-10 DIAGNOSIS — R0602 Shortness of breath: Secondary | ICD-10-CM | POA: Diagnosis not present

## 2018-01-10 DIAGNOSIS — I2510 Atherosclerotic heart disease of native coronary artery without angina pectoris: Secondary | ICD-10-CM | POA: Insufficient documentation

## 2018-01-10 DIAGNOSIS — J189 Pneumonia, unspecified organism: Secondary | ICD-10-CM

## 2018-01-10 DIAGNOSIS — M069 Rheumatoid arthritis, unspecified: Secondary | ICD-10-CM | POA: Insufficient documentation

## 2018-01-10 DIAGNOSIS — E876 Hypokalemia: Secondary | ICD-10-CM | POA: Insufficient documentation

## 2018-01-10 DIAGNOSIS — J181 Lobar pneumonia, unspecified organism: Secondary | ICD-10-CM | POA: Insufficient documentation

## 2018-01-10 LAB — D-DIMER, QUANTITATIVE: D-Dimer, Quant: 0.32 ug/mL-FEU (ref 0.00–0.50)

## 2018-01-10 LAB — URINALYSIS, ROUTINE W REFLEX MICROSCOPIC
Bilirubin Urine: NEGATIVE
Glucose, UA: NEGATIVE mg/dL
Hgb urine dipstick: NEGATIVE
Ketones, ur: NEGATIVE mg/dL
LEUKOCYTES UA: NEGATIVE
NITRITE: NEGATIVE
PROTEIN: NEGATIVE mg/dL
Specific Gravity, Urine: 1.004 — ABNORMAL LOW (ref 1.005–1.030)
pH: 8 (ref 5.0–8.0)

## 2018-01-10 LAB — CBC
HEMATOCRIT: 46.6 % — AB (ref 36.0–46.0)
HEMOGLOBIN: 15.7 g/dL — AB (ref 12.0–15.0)
MCH: 32.2 pg (ref 26.0–34.0)
MCHC: 33.7 g/dL (ref 30.0–36.0)
MCV: 95.5 fL (ref 78.0–100.0)
Platelets: 432 10*3/uL — ABNORMAL HIGH (ref 150–400)
RBC: 4.88 MIL/uL (ref 3.87–5.11)
RDW: 13.1 % (ref 11.5–15.5)
WBC: 14.7 10*3/uL — ABNORMAL HIGH (ref 4.0–10.5)

## 2018-01-10 LAB — BASIC METABOLIC PANEL
Anion gap: 14 (ref 5–15)
BUN: 10 mg/dL (ref 8–23)
CHLORIDE: 100 mmol/L (ref 98–111)
CO2: 27 mmol/L (ref 22–32)
CREATININE: 1.01 mg/dL — AB (ref 0.44–1.00)
Calcium: 10 mg/dL (ref 8.9–10.3)
GFR calc non Af Amer: 58 mL/min — ABNORMAL LOW (ref >60)
Glucose, Bld: 138 mg/dL — ABNORMAL HIGH (ref 70–99)
POTASSIUM: 3.1 mmol/L — AB (ref 3.5–5.1)
Sodium: 141 mmol/L (ref 135–145)

## 2018-01-10 LAB — POCT I-STAT TROPONIN I
TROPONIN I, POC: 0 ng/mL (ref 0.00–0.08)
Troponin i, poc: 0 ng/mL (ref 0.00–0.08)

## 2018-01-10 MED ORDER — ASPIRIN 81 MG PO CHEW
324.0000 mg | CHEWABLE_TABLET | Freq: Once | ORAL | Status: AC
  Administered 2018-01-10: 324 mg via ORAL
  Filled 2018-01-10: qty 4

## 2018-01-10 MED ORDER — POTASSIUM CHLORIDE CRYS ER 20 MEQ PO TBCR: 20.0000 meq | EXTENDED_RELEASE_TABLET | Freq: Two times a day (BID) | ORAL | 0 refills | Status: DC

## 2018-01-10 MED ORDER — GI COCKTAIL ~~LOC~~
30.0000 mL | Freq: Once | ORAL | Status: AC
  Administered 2018-01-10: 30 mL via ORAL
  Filled 2018-01-10: qty 30

## 2018-01-10 MED ORDER — POTASSIUM CHLORIDE 20 MEQ/15ML (10%) PO SOLN
40.0000 meq | Freq: Once | ORAL | Status: AC
  Administered 2018-01-10: 40 meq via ORAL
  Filled 2018-01-10: qty 30

## 2018-01-10 MED ORDER — POTASSIUM CHLORIDE CRYS ER 20 MEQ PO TBCR: 40.0000 meq | EXTENDED_RELEASE_TABLET | Freq: Once | ORAL | Status: DC

## 2018-01-10 MED ORDER — AMOXICILLIN-POT CLAVULANATE ER 1000-62.5 MG PO TB12: 2.0000 | ORAL_TABLET | Freq: Two times a day (BID) | ORAL | 0 refills | Status: AC

## 2018-01-10 MED ORDER — AMOXICILLIN-POT CLAVULANATE ER 1000-62.5 MG PO TB12
2.0000 | ORAL_TABLET | Freq: Once | ORAL | Status: AC
  Administered 2018-01-10: 2 via ORAL
  Filled 2018-01-10: qty 2

## 2018-01-10 MED ORDER — AZITHROMYCIN 250 MG PO TABS: 250.0000 mg | ORAL_TABLET | Freq: Every day | ORAL | 0 refills | Status: DC

## 2018-01-10 MED ORDER — AZITHROMYCIN 250 MG PO TABS
500.0000 mg | ORAL_TABLET | Freq: Once | ORAL | Status: AC
  Administered 2018-01-10: 500 mg via ORAL
  Filled 2018-01-10: qty 2

## 2018-01-10 MED ORDER — ONDANSETRON HCL 4 MG/2ML IJ SOLN
4.0000 mg | Freq: Once | INTRAMUSCULAR | Status: AC
  Administered 2018-01-10: 4 mg via INTRAVENOUS
  Filled 2018-01-10: qty 2

## 2018-01-10 MED ORDER — AMOXICILLIN-POT CLAVULANATE ER 1000-62.5 MG PO TB12: 2.0000 | ORAL_TABLET | Freq: Two times a day (BID) | ORAL | Status: DC

## 2018-01-10 MED ORDER — LACTATED RINGERS IV BOLUS
1000.0000 mL | Freq: Once | INTRAVENOUS | Status: AC
  Administered 2018-01-10: 1000 mL via INTRAVENOUS

## 2018-01-10 NOTE — ED Provider Notes (Signed)
[facility] DEPT Provider Note   CSN: [id number] Arrival date & time: 01/10/18  1332     History   Chief Complaint Chief Complaint  Patient presents with  . Chest Pain    HPI [name] is a 62 y.o. female.  HPI  62 year old female presents with chest pain.  She has multiple complaints and all of these has been ongoing for 2 or 3 weeks.  She feels generally fatigued as well as having chest pressure but also chest burning.  It seems increased with activities.  It is essentially a continuous feeling as well.  Seem to be worse today so she called her cardiologist, Dr. Terrence Dupont, who told her to be evaluated in the emergency department.  She states she has narrow coronary arteries in the distal branches and thinks she had a cath at least 5 or 6 years ago.  However she has never had a stent or required other management.  She is on medical management.  She does have some shortness of breath.  No change with eating or drinking.  She also has diffuse numbness and tingling.  No vomiting but is nauseated and having decreased appetite. Some dry cough for a few weeks.  Past Medical History:  Diagnosis Date  . Anxiety   . Arthritis   . GERD (gastroesophageal reflux disease)   . Hyperlipidemia   . Hypertension     Patient Active Problem List   Diagnosis Date Noted  . Rheumatoid arthritis of multiple sites with negative rheumatoid factor ([place]) 08/21/2017  . Lymph node enlargement 07/03/2017  . Seborrheic keratosis 10/05/2016  . Actinic keratosis due to exposure to sunlight 10/05/2016  . Bursitis of hip 07/07/2016  . Essential hypertension 04/04/2016  . Gastroesophageal reflux disease without esophagitis 04/04/2016  . DDD (degenerative disc disease), lumbar 04/04/2016  . Osteoarthritis of spine with radiculopathy, lumbosacral region 04/04/2016  . Coronary artery disease of native heart with stable angina pectoris ([place]) 04/04/2016  . Angina pectoris ([place])  04/04/2016  . Migraine 04/04/2016    Past Surgical History:  Procedure Laterality Date  . ABDOMINAL HYSTERECTOMY    . CERVICAL FUSION    . KNEE ARTHROSCOPY Left   . LUMBAR FUSION       OB History   None      Home Medications    Prior to Admission medications   Medication Sig Start Date End Date Taking? Authorizing Provider  amitriptyline (ELAVIL) 50 MG tablet Take 1 tablet (50 mg total) by mouth at bedtime. 01/08/17  Yes Terald Sleeper, PA-C  aspirin 81 MG chewable tablet Chew 81 mg by mouth daily.   Yes [provider]  atenolol (TENORMIN) 25 MG tablet Take 25 mg by mouth 2 (two) times daily.  10/03/16  Yes [provider]  DEXILANT 60 MG capsule Take 1 capsule by mouth daily. 06/09/16  Yes [provider]  hydrochlorothiazide (HYDRODIURIL) 25 MG tablet Take 25 mg by mouth daily.   Yes [provider]  isosorbide mononitrate (IMDUR) 60 MG 24 hr tablet Take 60 mg by mouth daily.   Yes [provider]  oxyCODONE (OXYCONTIN) 60 MG 12 hr tablet Take 60 mg by mouth every 12 (twelve) hours. 12/19/17  Yes Terald Sleeper, PA-C  potassium chloride (K-DUR) 10 MEQ tablet Take 10 mEq by mouth daily. 03/27/16  Yes [provider]  rosuvastatin (CRESTOR) 5 MG tablet Take 5 mg by mouth daily.   Yes [provider]  topiramate (TOPAMAX)  100 MG tablet Take 1 tablet (100 mg total) by mouth 2 (two) times daily. 01/08/17  Yes Terald Sleeper, PA-C  vitamin C (ASCORBIC ACID) 500 MG tablet Take 500 mg by mouth daily.   Yes [provider]  amoxicillin-clavulanate (AUGMENTIN XR) 1000-62.5 MG 12 hr tablet Take 2 tablets by mouth 2 (two) times daily for 7 days. 01/11/18 01/18/18  Sherwood Gambler, MD  azithromycin (ZITHROMAX) 250 MG tablet Take 1 tablet (250 mg total) by mouth daily. 01/11/18   Sherwood Gambler, MD  oxyCODONE (OXYCONTIN) 60 MG 12 hr tablet Take 60 mg by mouth every 12 (twelve) hours. Patient not taking: Reported on 01/10/2018 12/19/17    Terald Sleeper, PA-C  oxyCODONE (OXYCONTIN) 60 MG 12 hr tablet Take 60 mg by mouth 2 (two) times daily. Patient not taking: Reported on 01/10/2018 12/19/17   Terald Sleeper, PA-C  tiZANidine (ZANAFLEX) 2 MG tablet Take 1 tablet (2 mg total) by mouth every 8 (eight) hours as needed for muscle spasms. Patient taking differently: Take 1 mg by mouth every 8 (eight) hours as needed for muscle spasms.  12/19/17   Terald Sleeper, PA-C    Family History Family History  Problem Relation Age of Onset  . Congestive Heart Failure Mother   . Arthritis Mother   . Cancer Father        lung  . Arthritis/Rheumatoid Maternal Grandmother   . Cancer Paternal Grandfather        skin    Social History Social History   Tobacco Use  . Smoking status: Never Smoker  . Smokeless tobacco: Never Used  Substance Use Topics  . Alcohol use: No  . Drug use: No     Allergies   Patient has no known allergies.   Review of Systems Review of Systems  Constitutional: Positive for fatigue. Negative for fever.  Respiratory: Positive for shortness of breath.   Cardiovascular: Positive for chest pain. Negative for leg swelling.  Gastrointestinal: Negative for vomiting.  Musculoskeletal: Positive for back pain (chronic).  All other systems reviewed and are negative.    Physical Exam Updated Vital Signs BP 130/80   Pulse 86   Temp 98.1 F (36.7 C) (Oral)   Resp 20   Ht 5\' 6"  (1.676 m)   Wt 83.5 kg   SpO2 98%   BMI 29.70 kg/m   Physical Exam  Constitutional: She is oriented to person, place, and time. She appears well-developed and well-nourished.  Non-toxic appearance. She does not appear ill. No distress.  HENT:  Head: Normocephalic and atraumatic.  Right Ear: External ear normal.  Left Ear: External ear normal.  Nose: Nose normal.  Eyes: Right eye exhibits no discharge. Left eye exhibits no discharge.  Cardiovascular: Normal rate, regular rhythm and normal heart sounds.  Pulmonary/Chest: Effort  normal and breath sounds normal. She exhibits no tenderness.  Abdominal: Soft. There is no tenderness.  Neurological: She is alert and oriented to person, place, and time.  Skin: Skin is warm and dry. She is not diaphoretic.  Nursing note and vitals reviewed.    ED Treatments / Results  Labs (all labs ordered are listed, but only abnormal results are displayed) Labs Reviewed  BASIC METABOLIC PANEL - Abnormal; Notable for the following components:      Result Value   Potassium 3.1 (*)    Glucose, Bld 138 (*)    Creatinine, Ser 1.01 (*)    GFR calc non Af Amer 58 (*)  All other components within normal limits  CBC - Abnormal; Notable for the following components:   WBC 14.7 (*)    Hemoglobin 15.7 (*)    HCT 46.6 (*)    Platelets 432 (*)    All other components within normal limits  URINALYSIS, ROUTINE W REFLEX MICROSCOPIC - Abnormal; Notable for the following components:   Color, Urine STRAW (*)    Specific Gravity, Urine 1.004 (*)    All other components within normal limits  D-DIMER, QUANTITATIVE (NOT AT [facility])  I-STAT TROPONIN, ED  POCT I-STAT TROPONIN I  I-STAT TROPONIN, ED  POCT I-STAT TROPONIN I    EKG EKG Interpretation  Date/Time:  Thursday January 10 2018 13:51:20 EDT Ventricular Rate:  90 PR Interval:    QRS Duration: 81 QT Interval:  379 QTC Calculation: 464 R Axis:   70 Text Interpretation:  Sinus rhythm Abnormal R-wave progression, early transition Confirmed by Sherwood Gambler [phone]) on 01/10/2018 6:45:00 PM   EKG Interpretation  Date/Time:  Thursday January 10 2018 13:51:20 EDT Ventricular Rate:  90 PR Interval:    QRS Duration: 81 QT Interval:  379 QTC Calculation: 464 R Axis:   70 Text Interpretation:  Sinus rhythm Abnormal R-wave progression, early transition no significant change since earlier in the day Confirmed by Sherwood Gambler [id number]) on 01/10/2018 11:22:02 PM        Radiology Dg Chest 2 View  Result Date: 01/10/2018 CLINICAL DATA:   Hypertension. Recent chest pain with intermittent shortness of breath EXAM: CHEST - 2 VIEW COMPARISON:  June 12, 2007 FINDINGS: There is ill-defined increased opacity in the inferior lingula. Lungs elsewhere are clear. Heart size and pulmonary vascularity are normal. No adenopathy. There is postoperative change in the lower cervical spine. IMPRESSION: Ill-defined opacity in the inferior lingula. While there may be mild scarring in this area, a degree of pneumonia in this area is suspected. Lungs elsewhere are clear. Heart size is normal. No adenopathy evident. Followup PA and lateral chest radiographs recommended in 3-4 weeks following trial of antibiotic therapy to ensure resolution and exclude underlying malignancy. Electronically Signed   By: Lowella Grip III M.D.   On: 01/10/2018 14:13    Procedures Procedures (including critical care time)  Medications Ordered in ED Medications  ondansetron (ZOFRAN) injection 4 mg (4 mg Intravenous Given 01/10/18 2052)  lactated ringers bolus 1,000 mL (0 mLs Intravenous Stopped 01/10/18 2249)  aspirin chewable tablet 324 mg (324 mg Oral Given 01/10/18 2005)  gi cocktail (Maalox,Lidocaine,Donnatal) (30 mLs Oral Given 01/10/18 2005)  potassium chloride 20 MEQ/15ML (10%) solution 40 mEq (40 mEq Oral Given 01/10/18 2005)  azithromycin (ZITHROMAX) tablet 500 mg (500 mg Oral Given 01/10/18 2314)  amoxicillin-clavulanate (AUGMENTIN XR) 1000-62.5 MG per 12 hr tablet 2 tablet (2 tablets Oral Given 01/10/18 2314)     Initial Impression / Assessment and Plan / ED Course  I have reviewed the triage vital signs and the nursing notes.  Pertinent labs & imaging results that were available during my care of the patient were reviewed by me and considered in my medical decision making (see chart for details).     Symptoms have now been ongoing for about 2 weeks.  Chest x-ray shows pneumonia which is probably the cause of her overall not feeling well, especially with her cough  for the same amount of time.  She does have rheumatoid arthritis and possibly some minimal coronary disease although I cannot see these records.  Thus she will be treated with Augmentin/azithromycin  for more complex pneumonia.  However she is not hypoxic, does not have significant increased work of breathing, and her labs are overall reassuring besides mild hypokalemia.  Her troponins are negative x2 and I do not think this represents ACS.  She has been given IV fluids.  She will be discharged home with antibiotics and potassium replacement.  Follow-up with PCP.  Discussed return precautions.  Final Clinical Impressions(s) / ED Diagnoses   Final diagnoses:  Community acquired pneumonia of left lower lobe of lung [facility])    ED Discharge Orders         Ordered    amoxicillin-clavulanate (AUGMENTIN XR) 1000-62.5 MG 12 hr tablet  2 times daily     01/10/18 2301    azithromycin (ZITHROMAX) 250 MG tablet  Daily     01/10/18 2301           Sherwood Gambler, MD 01/10/18 2322

## 2018-01-10 NOTE — ED Triage Notes (Signed)
Pt states for 2-3 weeks, she has been having chest pain, arm numbness, and just "feels bad".  Pt told to come by cardiologist.

## 2018-01-10 NOTE — Discharge Instructions (Addendum)
If you develop worsening chest pain, shortness of breath or if you develop fever, vomiting or any other new/concerning symptoms then return to the ER for evaluation. Otherwise take your antibiotics as prescribed to completion, even if you are feeling better.

## 2018-01-15 ENCOUNTER — Encounter: Payer: Self-pay | Admitting: Family

## 2018-01-15 ENCOUNTER — Ambulatory Visit (INDEPENDENT_AMBULATORY_CARE_PROVIDER_SITE_OTHER): Payer: BLUE CROSS/BLUE SHIELD | Admitting: Family

## 2018-01-15 ENCOUNTER — Ambulatory Visit: Payer: BLUE CROSS/BLUE SHIELD | Admitting: Family Medicine

## 2018-01-15 VITALS — BP 135/76 | HR 89 | Temp 97.5°F | Ht 66.0 in | Wt 184.4 lb

## 2018-01-15 DIAGNOSIS — Z09 Encounter for follow-up examination after completed treatment for conditions other than malignant neoplasm: Secondary | ICD-10-CM

## 2018-01-15 DIAGNOSIS — E876 Hypokalemia: Secondary | ICD-10-CM | POA: Diagnosis not present

## 2018-01-15 DIAGNOSIS — J189 Pneumonia, unspecified organism: Secondary | ICD-10-CM | POA: Diagnosis not present

## 2018-01-15 DIAGNOSIS — M0609 Rheumatoid arthritis without rheumatoid factor, multiple sites: Secondary | ICD-10-CM | POA: Diagnosis not present

## 2018-01-15 DIAGNOSIS — M2550 Pain in unspecified joint: Secondary | ICD-10-CM

## 2018-01-15 NOTE — Progress Notes (Signed)
   Subjective:    Patient ID: [name], female    DOB: [birth year], 62 y.o.   MRN: [id number]  Chief Complaint  Patient presents with  . Follow-up    pneumonia    HPI Pt presents to the office today for hospital follow up. PT went to the ED on 01/10/18 and had a chest x-ray that showed CAP. Pt was started on Zpak and Augmentin. She has completed Zpak and is still taking Augmentin.  She reports she only took one tablet of Augmentin XR 1000/62.5 mg today instead of two. She reports she had an intermittent red rash and was worried this was from the Augmentin. She states she has swollen lymph nodes swollen around her neck, armpit, and groin.   She states she has generalized joint pain in her hands, arms, and legs. She states she has been diagnosed with Rheumatoid arthritis in the past, but has never seen a Rheumatologists.    Review of Systems  Constitutional: Positive for fatigue.  Respiratory: Positive for shortness of breath.   Musculoskeletal: Positive for arthralgias.  Neurological: Positive for weakness.  All other systems reviewed and are negative.      Objective:   Physical Exam  Constitutional: She is oriented to person, place, and time. She appears well-developed and well-nourished. No distress.  HENT:  Head: Normocephalic and atraumatic.  Right Ear: External ear normal.  Left Ear: External ear normal.  Nose: Mucosal edema and rhinorrhea present.  Mouth/Throat: Oropharynx is clear and moist.  Eyes: Pupils are equal, round, and reactive to light.  Neck: Normal range of motion. Neck supple. No thyromegaly present.  Cardiovascular: Normal rate, regular rhythm, normal heart sounds and intact distal pulses.  No murmur heard. Pulmonary/Chest: Effort normal. No respiratory distress. She has decreased breath sounds. She has no wheezes.  Abdominal: Soft. Bowel sounds are normal. She exhibits no distension. There is no tenderness.  Musculoskeletal: Normal range of motion. She  exhibits no edema or tenderness.  Neurological: She is alert and oriented to person, place, and time. She has normal reflexes. No cranial nerve deficit.  Skin: Skin is warm and dry.  Psychiatric: Her behavior is normal. Judgment and thought content normal. Her mood appears anxious.  Vitals reviewed.     BP 135/76   Pulse 89   Temp (!) 97.5 F (36.4 C) (Oral)   Ht '5\' 6"'$  (1.676 m)   Wt 184 lb 6.4 oz (83.6 kg)   BMI 29.76 kg/m      Assessment & Plan:  ADELIE CROSWELL comes in today with chief complaint of Follow-up (pneumonia)   Diagnosis and orders addressed:  1. Community acquired pneumonia, unspecified laterality - Continue Augmentin - CMP14+EGFR - CBC with Differential/Platelet  2. Hospital discharge follow-up - CMP14+EGFR - CBC with Differential/Platelet  3. Rheumatoid arthritis of multiple sites with negative rheumatoid factor ([place]) Labs pending and will do referral to Rheumatologists  - Arthritis Panel - CMP14+EGFR - CBC with Differential/Platelet - Ambulatory referral to Rheumatology  4. Hypokalemia Labs pending, continue potassium tablet - CMP14+EGFR - CBC with Differential/Platelet  5. Generalized joint pain - Arthritis Panel - CMP14+EGFR - CBC with Differential/Platelet - Ambulatory referral to Rheumatology   Labs pending Health Maintenance reviewed Diet and exercise encouraged  Follow up plan: Follow up in 3 weeks with PCP for repeat chest x-ray, continue Augmentin   Evelina Dun, FNP

## 2018-01-15 NOTE — Patient Instructions (Signed)

## 2018-01-16 ENCOUNTER — Other Ambulatory Visit: Payer: Self-pay | Admitting: Physician Assistant

## 2018-01-16 DIAGNOSIS — M5136 Other intervertebral disc degeneration, lumbar region: Secondary | ICD-10-CM

## 2018-01-16 LAB — CMP14+EGFR
A/G RATIO: 1.7 (ref 1.2–2.2)
ALT: 17 IU/L (ref 0–32)
AST: 21 IU/L (ref 0–40)
Albumin: 4.4 g/dL (ref 3.6–4.8)
Alkaline Phosphatase: 112 IU/L (ref 39–117)
BUN/Creatinine Ratio: 9 — ABNORMAL LOW (ref 12–28)
BUN: 8 mg/dL (ref 8–27)
Bilirubin Total: 0.4 mg/dL (ref 0.0–1.2)
CALCIUM: 10.2 mg/dL (ref 8.7–10.3)
CO2: 23 mmol/L (ref 20–29)
CREATININE: 0.89 mg/dL (ref 0.57–1.00)
Chloride: 104 mmol/L (ref 96–106)
GFR calc non Af Amer: 69 mL/min/{1.73_m2} (ref >59)
GFR, EST AFRICAN AMERICAN: 80 mL/min/{1.73_m2} (ref >59)
GLUCOSE: 134 mg/dL — AB (ref 65–99)
Globulin, Total: 2.6 g/dL (ref 1.5–4.5)
POTASSIUM: 4.3 mmol/L (ref 3.5–5.2)
Sodium: 142 mmol/L (ref 134–144)
Total Protein: 7 g/dL (ref 6.0–8.5)

## 2018-01-16 LAB — ARTHRITIS PANEL
BASOS ABS: 0 10*3/uL (ref 0.0–0.2)
Basos: 0 %
EOS (ABSOLUTE): 0.1 10*3/uL (ref 0.0–0.4)
Eos: 1 %
Hematocrit: 43.6 % (ref 34.0–46.6)
Hemoglobin: 14.4 g/dL (ref 11.1–15.9)
IMMATURE GRANS (ABS): 0 10*3/uL (ref 0.0–0.1)
IMMATURE GRANULOCYTES: 0 %
LYMPHS: 18 %
Lymphocytes Absolute: 2.2 10*3/uL (ref 0.7–3.1)
MCH: 32.4 pg (ref 26.6–33.0)
MCHC: 33 g/dL (ref 31.5–35.7)
MCV: 98 fL — ABNORMAL HIGH (ref 79–97)
MONOCYTES: 4 %
Monocytes Absolute: 0.6 10*3/uL (ref 0.1–0.9)
NEUTROS PCT: 77 %
Neutrophils Absolute: 9.4 10*3/uL — ABNORMAL HIGH (ref 1.4–7.0)
Platelets: 403 10*3/uL (ref 150–450)
RBC: 4.44 x10E6/uL (ref 3.77–5.28)
RDW: 13.5 % (ref 12.3–15.4)
SED RATE: 21 mm/h (ref 0–40)
Uric Acid: 5.4 mg/dL (ref 2.5–7.1)
WBC: 12.4 10*3/uL — ABNORMAL HIGH (ref 3.4–10.8)

## 2018-01-17 ENCOUNTER — Other Ambulatory Visit: Payer: Self-pay

## 2018-01-17 DIAGNOSIS — R739 Hyperglycemia, unspecified: Secondary | ICD-10-CM

## 2018-01-19 LAB — HGB A1C W/O EAG: Hgb A1c MFr Bld: 6.6 % — ABNORMAL HIGH (ref 4.8–5.6)

## 2018-01-19 LAB — SPECIMEN STATUS REPORT

## 2018-01-22 ENCOUNTER — Telehealth: Payer: Self-pay | Admitting: Physician Assistant

## 2018-01-22 NOTE — Telephone Encounter (Signed)
Only available appointments are for triag/acute.  Please advise.

## 2018-01-23 ENCOUNTER — Ambulatory Visit: Payer: BLUE CROSS/BLUE SHIELD | Admitting: Family Medicine

## 2018-01-23 NOTE — Telephone Encounter (Signed)
Appointment given for today at 1:55pm

## 2018-01-23 NOTE — Telephone Encounter (Signed)
Can you try and find a spot, if not, urgent is Dr. Livia Snellen and we are on the same hallway

## 2018-01-30 ENCOUNTER — Encounter: Payer: Self-pay | Admitting: Physician Assistant

## 2018-02-05 ENCOUNTER — Ambulatory Visit (INDEPENDENT_AMBULATORY_CARE_PROVIDER_SITE_OTHER): Payer: BLUE CROSS/BLUE SHIELD

## 2018-02-05 ENCOUNTER — Ambulatory Visit: Payer: BLUE CROSS/BLUE SHIELD | Admitting: Physician Assistant

## 2018-02-05 ENCOUNTER — Encounter: Payer: Self-pay | Admitting: Physician Assistant

## 2018-02-05 VITALS — BP 149/95 | HR 114 | Temp 97.4°F | Ht 66.0 in | Wt 179.6 lb

## 2018-02-05 DIAGNOSIS — R531 Weakness: Secondary | ICD-10-CM

## 2018-02-05 DIAGNOSIS — J189 Pneumonia, unspecified organism: Secondary | ICD-10-CM | POA: Diagnosis not present

## 2018-02-05 DIAGNOSIS — R202 Paresthesia of skin: Secondary | ICD-10-CM

## 2018-02-05 DIAGNOSIS — R200 Anesthesia of skin: Secondary | ICD-10-CM

## 2018-02-05 DIAGNOSIS — H9192 Unspecified hearing loss, left ear: Secondary | ICD-10-CM

## 2018-02-05 DIAGNOSIS — R52 Pain, unspecified: Secondary | ICD-10-CM | POA: Diagnosis not present

## 2018-02-05 DIAGNOSIS — M542 Cervicalgia: Secondary | ICD-10-CM

## 2018-02-05 DIAGNOSIS — G629 Polyneuropathy, unspecified: Secondary | ICD-10-CM | POA: Diagnosis not present

## 2018-02-05 MED ORDER — POTASSIUM CHLORIDE CRYS ER 20 MEQ PO TBCR: 20.0000 meq | EXTENDED_RELEASE_TABLET | Freq: Every day | ORAL | 3 refills | Status: DC

## 2018-02-06 ENCOUNTER — Other Ambulatory Visit: Payer: Self-pay

## 2018-02-06 DIAGNOSIS — E876 Hypokalemia: Secondary | ICD-10-CM

## 2018-02-07 LAB — CMP14+EGFR
ALK PHOS: 137 IU/L — AB (ref 39–117)
ALT: 22 IU/L (ref 0–32)
AST: 26 IU/L (ref 0–40)
Albumin/Globulin Ratio: 1.6 (ref 1.2–2.2)
Albumin: 4.4 g/dL (ref 3.6–4.8)
BUN/Creatinine Ratio: 7 — ABNORMAL LOW (ref 12–28)
BUN: 7 mg/dL — AB (ref 8–27)
Bilirubin Total: 0.3 mg/dL (ref 0.0–1.2)
CALCIUM: 9.7 mg/dL (ref 8.7–10.3)
CO2: 27 mmol/L (ref 20–29)
CREATININE: 0.98 mg/dL (ref 0.57–1.00)
Chloride: 92 mmol/L — ABNORMAL LOW (ref 96–106)
GFR calc Af Amer: 71 mL/min/{1.73_m2} (ref >59)
GFR, EST NON AFRICAN AMERICAN: 62 mL/min/{1.73_m2} (ref >59)
GLUCOSE: 148 mg/dL — AB (ref 65–99)
Globulin, Total: 2.7 g/dL (ref 1.5–4.5)
Potassium: 3.1 mmol/L — ABNORMAL LOW (ref 3.5–5.2)
SODIUM: 138 mmol/L (ref 134–144)
Total Protein: 7.1 g/dL (ref 6.0–8.5)

## 2018-02-07 LAB — CBC WITH DIFFERENTIAL/PLATELET
BASOS: 0 %
Basophils Absolute: 0 10*3/uL (ref 0.0–0.2)
EOS (ABSOLUTE): 0.2 10*3/uL (ref 0.0–0.4)
EOS: 2 %
HEMATOCRIT: 44.3 % (ref 34.0–46.6)
Hemoglobin: 15 g/dL (ref 11.1–15.9)
IMMATURE GRANS (ABS): 0 10*3/uL (ref 0.0–0.1)
IMMATURE GRANULOCYTES: 0 %
LYMPHS: 17 %
Lymphocytes Absolute: 2 10*3/uL (ref 0.7–3.1)
MCH: 32.1 pg (ref 26.6–33.0)
MCHC: 33.9 g/dL (ref 31.5–35.7)
MCV: 95 fL (ref 79–97)
MONOCYTES: 7 %
MONOS ABS: 0.8 10*3/uL (ref 0.1–0.9)
NEUTROS PCT: 74 %
Neutrophils Absolute: 8.7 10*3/uL — ABNORMAL HIGH (ref 1.4–7.0)
Platelets: 412 10*3/uL (ref 150–450)
RBC: 4.67 x10E6/uL (ref 3.77–5.28)
RDW: 13.6 % (ref 12.3–15.4)
WBC: 11.7 10*3/uL — AB (ref 3.4–10.8)

## 2018-02-07 LAB — LYME AB/WESTERN BLOT REFLEX: LYME DISEASE AB, QUANT, IGM: <0.8 index (ref 0.00–0.79)

## 2018-02-07 LAB — ROCKY MTN SPOTTED FVR ABS PNL(IGG+IGM)
RMSF IGM: 0.27 {index} (ref 0.00–0.89)
RMSF IgG: NEGATIVE

## 2018-02-07 LAB — THYROID PANEL WITH TSH
FREE THYROXINE INDEX: 2.2 (ref 1.2–4.9)
T3 UPTAKE RATIO: 24 % (ref 24–39)
T4 TOTAL: 9.2 ug/dL (ref 4.5–12.0)
TSH: 2.05 u[IU]/mL (ref 0.450–4.500)

## 2018-02-07 LAB — FOLATE: Folate: 10 ng/mL (ref >3.0)

## 2018-02-07 LAB — IRON: Iron: 68 ug/dL (ref 27–139)

## 2018-02-07 LAB — VITAMIN B12: VITAMIN B 12: 294 pg/mL (ref 232–1245)

## 2018-02-08 NOTE — Progress Notes (Signed)
BP (!) 149/95   Pulse (!) 114   Temp (!) 97.4 F (36.3 C) (Oral)   Ht _0  (1.676 m)   Wt 179 lb 9.6 oz (81.5 kg)   BMI 28.99 kg/m    Subjective:    Patient ID: [name], female    DOB: 1955/07/09, 62 y.o.   MRN: [id number]  HPI: [name] is a 62 y.o. female presenting on 02/05/2018 for Pneumonia (3 week follow up ) and Numbness (all over )  This patient comes in with many overall symptoms.  She was seen in the emergency room for coming acquired pneumonia.  She states that that seems to be feeling better her breathing is quite good.  We have rechecked an x-ray.  At this time she is having some face numbness on the left.  Her hearing is decreased.  At times her whole body is numb and tingling.  She has had many levels of degenerative disc disease with neuropathy.  She is quite concerned about her well-being.  She has had other long-term illnesses including heart disease, rheumatoid arthritis, migraines.  She does not know of anyone else she is been sick.  Past Medical History:  Diagnosis Date  . Anxiety   . Arthritis   . GERD (gastroesophageal reflux disease)   . Hyperlipidemia   . Hypertension    Relevant past medical, surgical, family and social history reviewed and updated as indicated. Interim medical history since our last visit reviewed. Allergies and medications reviewed and updated. DATA REVIEWED: CHART IN EPIC  Family History reviewed for pertinent findings.  Review of Systems  Constitutional: Positive for fatigue and fever. Negative for activity change and chills.  HENT: Negative.   Eyes: Negative.   Respiratory: Negative.  Negative for cough.   Cardiovascular: Negative.  Negative for chest pain.  Gastrointestinal: Negative.  Negative for abdominal pain.  Endocrine: Negative.   Genitourinary: Negative.  Negative for dysuria.  Musculoskeletal: Negative.   Skin: Negative.   Neurological: Negative.     Allergies as of 02/05/2018   No Known Allergies     Medication List        Accurate as of 02/05/18 11:59 PM. Always use your most recent med list.          amitriptyline 50 MG tablet Commonly known as:  ELAVIL TAKE 1 TABLET BY MOUTH ONCE DAILY AT BEDTIME   aspirin 81 MG chewable tablet Chew 81 mg by mouth daily.   atenolol 25 MG tablet Commonly known as:  TENORMIN Take 25 mg by mouth 2 (two) times daily.   DEXILANT 60 MG capsule Generic drug:  dexlansoprazole Take 1 capsule by mouth daily.   hydrochlorothiazide 25 MG tablet Commonly known as:  HYDRODIURIL Take 25 mg by mouth daily.   isosorbide mononitrate 60 MG 24 hr tablet Commonly known as:  IMDUR Take 60 mg by mouth daily.   oxyCODONE 60 MG 12 hr tablet Commonly known as:  OXYCONTIN Take 60 mg by mouth every 12 (twelve) hours.   oxyCODONE 60 MG 12 hr tablet Commonly known as:  OXYCONTIN Take 60 mg by mouth every 12 (twelve) hours.   oxyCODONE 60 MG 12 hr tablet Commonly known as:  OXYCONTIN Take 60 mg by mouth 2 (two) times daily.   potassium chloride 10 MEQ tablet Commonly known as:  K-DUR Take 10 mEq by mouth daily.   potassium chloride SA 20 MEQ tablet Commonly known as:  K-DUR,KLOR-CON Take 1 tablet (20  mEq total) by mouth daily.   rosuvastatin 5 MG tablet Commonly known as:  CRESTOR Take 5 mg by mouth daily.   tiZANidine 2 MG tablet Commonly known as:  ZANAFLEX Take 1 tablet (2 mg total) by mouth every 8 (eight) hours as needed for muscle spasms.   topiramate 100 MG tablet Commonly known as:  TOPAMAX Take 1 tablet (100 mg total) by mouth 2 (two) times daily.   vitamin C 500 MG tablet Commonly known as:  ASCORBIC ACID Take 500 mg by mouth daily.          Objective:    BP (!) 149/95   Pulse (!) 114   Temp (!) 97.4 F (36.3 C) (Oral)   Ht _0  (1.676 m)   Wt 179 lb 9.6 oz (81.5 kg)   BMI 28.99 kg/m   No Known Allergies  Wt Readings from Last 3 Encounters:  02/05/18 179 lb 9.6 oz (81.5 kg)  01/15/18 184 lb 6.4 oz (83.6 kg)    01/10/18 184 lb (83.5 kg)    Physical Exam  Constitutional: She is oriented to person, place, and time. She appears well-developed and well-nourished.  HENT:  Head: Normocephalic and atraumatic.  Right Ear: Tympanic membrane, external ear and ear canal normal.  Left Ear: Tympanic membrane, external ear and ear canal normal.  Nose: Nose normal. No rhinorrhea.  Mouth/Throat: Oropharynx is clear and moist and mucous membranes are normal. No oropharyngeal exudate or posterior oropharyngeal erythema.  Eyes: Pupils are equal, round, and reactive to light. Conjunctivae and EOM are normal.  Neck: Normal range of motion. Neck supple.  Cardiovascular: Normal rate, regular rhythm, normal heart sounds and intact distal pulses.  Pulmonary/Chest: Effort normal and breath sounds normal.  Abdominal: Soft. Bowel sounds are normal.  Musculoskeletal:       Left shoulder: She exhibits pain and spasm.       Cervical back: She exhibits pain and spasm.       Lumbar back: She exhibits decreased range of motion, tenderness, pain and spasm.       Arms: Neurological: She is alert and oriented to person, place, and time. She has normal reflexes.  Skin: Skin is warm and dry. No rash noted.  Psychiatric: She has a normal mood and affect. Her behavior is normal. Judgment and thought content normal.    Results for orders placed or performed in visit on 02/05/18  Thyroid Panel With TSH  Result Value Ref Range   TSH 2.050 0.450 - 4.500 uIU/mL   T4, Total 9.2 4.5 - 12.0 ug/dL   T3 Uptake Ratio 24 24 - 39 %   Free Thyroxine Index 2.2 1.2 - 4.9  CMP14+EGFR  Result Value Ref Range   Glucose 148 (H) 65 - 99 mg/dL   BUN 7 (L) 8 - 27 mg/dL   Creatinine, Ser 0.98 0.57 - 1.00 mg/dL   GFR calc non Af Amer 62 >59 mL/min/1.73   GFR calc Af Amer 71 >59 mL/min/1.73   BUN/Creatinine Ratio 7 (L) 12 - 28   Sodium 138 134 - 144 mmol/L   Potassium 3.1 (L) 3.5 - 5.2 mmol/L   Chloride 92 (L) 96 - 106 mmol/L   CO2 27 20 - 29  mmol/L   Calcium 9.7 8.7 - 10.3 mg/dL   Total Protein 7.1 6.0 - 8.5 g/dL   Albumin 4.4 3.6 - 4.8 g/dL   Globulin, Total 2.7 1.5 - 4.5 g/dL   Albumin/Globulin Ratio 1.6 1.2 - 2.2  Bilirubin Total 0.3 0.0 - 1.2 mg/dL   Alkaline Phosphatase 137 (H) 39 - 117 IU/L   AST 26 0 - 40 IU/L   ALT 22 0 - 32 IU/L  CBC with Differential/Platelet  Result Value Ref Range   WBC 11.7 (H) 3.4 - 10.8 x10E3/uL   RBC 4.67 3.77 - 5.28 x10E6/uL   Hemoglobin 15.0 11.1 - 15.9 g/dL   Hematocrit 44.3 34.0 - 46.6 %   MCV 95 79 - 97 fL   MCH 32.1 26.6 - 33.0 pg   MCHC 33.9 31.5 - 35.7 g/dL   RDW 13.6 12.3 - 15.4 %   Platelets 412 150 - 450 x10E3/uL   Neutrophils 74 Not Estab. %   Lymphs 17 Not Estab. %   Monocytes 7 Not Estab. %   Eos 2 Not Estab. %   Basos 0 Not Estab. %   Neutrophils Absolute 8.7 (H) 1.4 - 7.0 x10E3/uL   Lymphocytes Absolute 2.0 0.7 - 3.1 x10E3/uL   Monocytes Absolute 0.8 0.1 - 0.9 x10E3/uL   EOS (ABSOLUTE) 0.2 0.0 - 0.4 x10E3/uL   Basophils Absolute 0.0 0.0 - 0.2 x10E3/uL   Immature Granulocytes 0 Not Estab. %   Immature Grans (Abs) 0.0 0.0 - 0.1 x10E3/uL  Rocky mtn spotted fvr abs pnl(IgG+IgM)  Result Value Ref Range   RMSF IgG Negative Negative   RMSF IgM 0.27 0.00 - 0.89 index  Lyme Ab/Western Blot Reflex  Result Value Ref Range   Lyme IgG/IgM Ab <0.91 0.00 - 0.90 ISR   LYME DISEASE AB, QUANT, IGM <0.80 0.00 - 0.79 index  Folate  Result Value Ref Range   Folate 10.0 >3.0 ng/mL  Vitamin B12  Result Value Ref Range   Vitamin B-12 294 232 - 1,245 pg/mL  Iron  Result Value Ref Range   Iron 68 27 - 139 ug/dL      Assessment & Plan:   1. Community acquired pneumonia, unspecified laterality - DG Chest 2 View; Future  2. Neuropathy - Thyroid Panel With TSH - CMP14+EGFR - CBC with Differential/Platelet - [place] mtn spotted fvr abs pnl(IgG+IgM) - Lyme Ab/Western Blot Reflex - Folate - Vitamin B12 - Iron  3. Numbness and tingling - Thyroid Panel With TSH -  CMP14+EGFR - CBC with Differential/Platelet - Rocky mtn spotted fvr abs pnl(IgG+IgM) - Lyme Ab/Western Blot Reflex - Folate - Vitamin B12 - Iron  4. Sensory loss - Thyroid Panel With TSH - CMP14+EGFR - CBC with Differential/Platelet - Rocky mtn spotted fvr abs pnl(IgG+IgM) - Lyme Ab/Western Blot Reflex - Folate - Vitamin B12 - Iron  5. Pain  6. Neck pain on left side  7. Hearing loss of left ear, unspecified hearing loss type  8. Weakness   Continue all other maintenance medications as listed above.  Follow up plan: Return in about 1 week (around 02/12/2018).  Educational handout given for [place] PA-C [facility] [address]  [place], [place] [address] [phone]   02/08/2018, 8:53 AM

## 2018-02-12 ENCOUNTER — Telehealth: Payer: Self-pay | Admitting: Physician Assistant

## 2018-02-12 ENCOUNTER — Encounter: Payer: Self-pay | Admitting: Physician Assistant

## 2018-02-12 ENCOUNTER — Ambulatory Visit: Payer: BLUE CROSS/BLUE SHIELD | Admitting: Physician Assistant

## 2018-02-12 VITALS — BP 135/87 | HR 104 | Temp 98.3°F | Ht 66.0 in | Wt 181.4 lb

## 2018-02-12 DIAGNOSIS — M5136 Other intervertebral disc degeneration, lumbar region: Secondary | ICD-10-CM

## 2018-02-12 DIAGNOSIS — E876 Hypokalemia: Secondary | ICD-10-CM | POA: Diagnosis not present

## 2018-02-12 DIAGNOSIS — G629 Polyneuropathy, unspecified: Secondary | ICD-10-CM

## 2018-02-12 DIAGNOSIS — M791 Myalgia, unspecified site: Secondary | ICD-10-CM | POA: Diagnosis not present

## 2018-02-12 MED ORDER — ZOLPIDEM TARTRATE 5 MG PO TABS: 5.0000 mg | ORAL_TABLET | Freq: Every evening | ORAL | 1 refills | Status: DC | PRN

## 2018-02-12 MED ORDER — POTASSIUM CHLORIDE ER 10 MEQ PO CPCR: 10.0000 meq | ORAL_CAPSULE | Freq: Two times a day (BID) | ORAL | 5 refills | Status: DC

## 2018-02-12 MED ORDER — PREDNISONE 10 MG (48) PO TBPK: ORAL_TABLET | ORAL | 0 refills | Status: DC

## 2018-02-12 NOTE — Progress Notes (Signed)
BP 135/87   Pulse (!) 104   Temp 98.3 F (36.8 C) (Oral)   Ht 5' 6" (1.676 m)   Wt 181 lb 6.4 oz (82.3 kg)   BMI 29.28 kg/m    Subjective:    Patient ID: [name], female    DOB: [birth year], 62 y.o.   MRN: [id number]  HPI: [name] is a 62 y.o. female presenting on 02/12/2018 for all over joint pain  This patient comes in for 1 week follow-up on her severe episodes of all of her joint pain and burning and tingling.  Labs were performed that did show her potassium to be low and her vitamin B12 to be in the low normal range.  She has made adjustments and started these medications.  She states she has not been able to tell a great difference in how she is feeling.  She still has significant pain and numbness.  She hurts particularly through the neck and low back and then down her legs.  She states she is not sleeping well due to the severe amount of pain and then just worry about what is going on.  We have discussed the possibility of autoimmune disease, we have also discussed the possibility of fibromyalgia.  She does have an appointment this week with rheumatology.  Due to the fact she has not slept in many weeks I will discuss with her that the sleep deprivation could add to her not feeling well and adding up to the problems that she is having.  She is willing to try very low-dose Ambien at this time.  She is also willing to try a course of prednisone to see if this can help with the inflammation.  She has had to take prednisone in the past for her severe degenerative spine which has had multiple surgeries.  She did not like taking them for a long time and the side effects.   Past Medical History:  Diagnosis Date  . Anxiety   . Arthritis   . GERD (gastroesophageal reflux disease)   . Hyperlipidemia   . Hypertension    Relevant past medical, surgical, family and social history reviewed and updated as indicated. Interim medical history since our last visit reviewed. Allergies  and medications reviewed and updated. DATA REVIEWED: CHART IN EPIC  Family History reviewed for pertinent findings.  Review of Systems  Constitutional: Positive for activity change and fatigue. Negative for chills and fever.  HENT: Negative.   Eyes: Negative.   Respiratory: Negative.   Cardiovascular: Negative.   Gastrointestinal: Negative.   Genitourinary: Negative.   Musculoskeletal: Positive for arthralgias, back pain, joint swelling and myalgias.  Neurological: Positive for weakness, numbness and headaches.    Allergies as of 02/12/2018   No Known Allergies     Medication List        Accurate as of 02/12/18  4:58 PM. Always use your most recent med list.          amitriptyline 50 MG tablet Commonly known as:  ELAVIL TAKE 1 TABLET BY MOUTH ONCE DAILY AT BEDTIME   aspirin 81 MG chewable tablet Chew 81 mg by mouth daily.   atenolol 25 MG tablet Commonly known as:  TENORMIN Take 25 mg by mouth 2 (two) times daily.   DEXILANT 60 MG capsule Generic drug:  dexlansoprazole Take 1 capsule by mouth daily.   hydrochlorothiazide 25 MG tablet Commonly known as:  HYDRODIURIL Take 25 mg by mouth daily.   isosorbide  mononitrate 60 MG 24 hr tablet Commonly known as:  IMDUR Take 60 mg by mouth daily.   oxyCODONE 60 MG 12 hr tablet Commonly known as:  OXYCONTIN Take 60 mg by mouth every 12 (twelve) hours.   oxyCODONE 60 MG 12 hr tablet Commonly known as:  OXYCONTIN Take 60 mg by mouth every 12 (twelve) hours.   oxyCODONE 60 MG 12 hr tablet Commonly known as:  OXYCONTIN Take 60 mg by mouth 2 (two) times daily.   potassium chloride 10 MEQ CR capsule Commonly known as:  MICRO-K Take 1 capsule (10 mEq total) by mouth 2 (two) times daily.   potassium chloride SA 20 MEQ tablet Commonly known as:  K-DUR,KLOR-CON Take 1 tablet (20 mEq total) by mouth daily.   predniSONE 10 MG (48) Tbpk tablet Commonly known as:  STERAPRED UNI-PAK 48 TAB Take as directed for 12 days     rosuvastatin 5 MG tablet Commonly known as:  CRESTOR Take 5 mg by mouth daily.   tiZANidine 2 MG tablet Commonly known as:  ZANAFLEX Take 1 tablet (2 mg total) by mouth every 8 (eight) hours as needed for muscle spasms.   topiramate 100 MG tablet Commonly known as:  TOPAMAX Take 1 tablet (100 mg total) by mouth 2 (two) times daily.   vitamin C 500 MG tablet Commonly known as:  ASCORBIC ACID Take 500 mg by mouth daily.   zolpidem 5 MG tablet Commonly known as:  AMBIEN Take 1 tablet (5 mg total) by mouth at bedtime as needed for sleep.          Objective:    BP 135/87   Pulse (!) 104   Temp 98.3 F (36.8 C) (Oral)   Ht 5' 6" (1.676 m)   Wt 181 lb 6.4 oz (82.3 kg)   BMI 29.28 kg/m   No Known Allergies  Wt Readings from Last 3 Encounters:  02/12/18 181 lb 6.4 oz (82.3 kg)  02/05/18 179 lb 9.6 oz (81.5 kg)  01/15/18 184 lb 6.4 oz (83.6 kg)    Physical Exam  Constitutional: She is oriented to person, place, and time. She appears well-developed and well-nourished. She appears distressed.  HENT:  Head: Normocephalic and atraumatic.  Eyes: Pupils are equal, round, and reactive to light. Conjunctivae and EOM are normal.  Cardiovascular: Normal rate, regular rhythm, normal heart sounds and intact distal pulses.  Pulmonary/Chest: Effort normal and breath sounds normal.  Abdominal: Soft. Bowel sounds are normal.  Musculoskeletal:       Cervical back: She exhibits decreased range of motion, pain and spasm.       Thoracic back: She exhibits decreased range of motion.       Lumbar back: She exhibits decreased range of motion, pain and spasm.       Back:  Neurological: She is alert and oriented to person, place, and time. She has normal reflexes.  Skin: Skin is warm and dry. No rash noted.  Psychiatric: She has a normal mood and affect. Her behavior is normal. Judgment and thought content normal.    Results for orders placed or performed in visit on 02/05/18  Thyroid  Panel With TSH  Result Value Ref Range   TSH 2.050 0.450 - 4.500 uIU/mL   T4, Total 9.2 4.5 - 12.0 ug/dL   T3 Uptake Ratio 24 24 - 39 %   Free Thyroxine Index 2.2 1.2 - 4.9  CMP14+EGFR  Result Value Ref Range   Glucose 148 (H) 65 - 99  mg/dL   BUN 7 (L) 8 - 27 mg/dL   Creatinine, Ser 0.98 0.57 - 1.00 mg/dL   GFR calc non Af Amer 62 >59 mL/min/1.73   GFR calc Af Amer 71 >59 mL/min/1.73   BUN/Creatinine Ratio 7 (L) 12 - 28   Sodium 138 134 - 144 mmol/L   Potassium 3.1 (L) 3.5 - 5.2 mmol/L   Chloride 92 (L) 96 - 106 mmol/L   CO2 27 20 - 29 mmol/L   Calcium 9.7 8.7 - 10.3 mg/dL   Total Protein 7.1 6.0 - 8.5 g/dL   Albumin 4.4 3.6 - 4.8 g/dL   Globulin, Total 2.7 1.5 - 4.5 g/dL   Albumin/Globulin Ratio 1.6 1.2 - 2.2   Bilirubin Total 0.3 0.0 - 1.2 mg/dL   Alkaline Phosphatase 137 (H) 39 - 117 IU/L   AST 26 0 - 40 IU/L   ALT 22 0 - 32 IU/L  CBC with Differential/Platelet  Result Value Ref Range   WBC 11.7 (H) 3.4 - 10.8 x10E3/uL   RBC 4.67 3.77 - 5.28 x10E6/uL   Hemoglobin 15.0 11.1 - 15.9 g/dL   Hematocrit 44.3 34.0 - 46.6 %   MCV 95 79 - 97 fL   MCH 32.1 26.6 - 33.0 pg   MCHC 33.9 31.5 - 35.7 g/dL   RDW 13.6 12.3 - 15.4 %   Platelets 412 150 - 450 x10E3/uL   Neutrophils 74 Not Estab. %   Lymphs 17 Not Estab. %   Monocytes 7 Not Estab. %   Eos 2 Not Estab. %   Basos 0 Not Estab. %   Neutrophils Absolute 8.7 (H) 1.4 - 7.0 x10E3/uL   Lymphocytes Absolute 2.0 0.7 - 3.1 x10E3/uL   Monocytes Absolute 0.8 0.1 - 0.9 x10E3/uL   EOS (ABSOLUTE) 0.2 0.0 - 0.4 x10E3/uL   Basophils Absolute 0.0 0.0 - 0.2 x10E3/uL   Immature Granulocytes 0 Not Estab. %   Immature Grans (Abs) 0.0 0.0 - 0.1 x10E3/uL  Rocky mtn spotted fvr abs pnl(IgG+IgM)  Result Value Ref Range   RMSF IgG Negative Negative   RMSF IgM 0.27 0.00 - 0.89 index  Lyme Ab/Western Blot Reflex  Result Value Ref Range   Lyme IgG/IgM Ab <0.91 0.00 - 0.90 ISR   LYME DISEASE AB, QUANT, IGM <0.80 0.00 - 0.79 index  Folate    Result Value Ref Range   Folate 10.0 >3.0 ng/mL  Vitamin B12  Result Value Ref Range   Vitamin B-12 294 232 - 1,245 pg/mL  Iron  Result Value Ref Range   Iron 68 27 - 139 ug/dL      Assessment & Plan:   1. Potassium (K) deficiency - CMP14+EGFR  2. DDD (degenerative disc disease), lumbar Continue medications  3. Myalgia Continue medications  4. Neuropathy Continue medications   Continue all other maintenance medications as listed above.  Follow up plan: No follow-ups on file.  Educational handout given for [place] PA-C [facility] [address]  [place], [place] [address] [phone]   02/12/2018, 4:58 PM

## 2018-02-13 LAB — CMP14+EGFR
ALBUMIN: 4.6 g/dL (ref 3.6–4.8)
ALT: 22 IU/L (ref 0–32)
AST: 25 IU/L (ref 0–40)
Albumin/Globulin Ratio: 1.9 (ref 1.2–2.2)
Alkaline Phosphatase: 140 IU/L — ABNORMAL HIGH (ref 39–117)
BILIRUBIN TOTAL: 0.3 mg/dL (ref 0.0–1.2)
BUN / CREAT RATIO: 9 — AB (ref 12–28)
BUN: 9 mg/dL (ref 8–27)
CHLORIDE: 93 mmol/L — AB (ref 96–106)
CO2: 27 mmol/L (ref 20–29)
CREATININE: 0.96 mg/dL (ref 0.57–1.00)
Calcium: 9.5 mg/dL (ref 8.7–10.3)
GFR calc non Af Amer: 63 mL/min/{1.73_m2} (ref >59)
GFR, EST AFRICAN AMERICAN: 73 mL/min/{1.73_m2} (ref >59)
GLUCOSE: 125 mg/dL — AB (ref 65–99)
Globulin, Total: 2.4 g/dL (ref 1.5–4.5)
Potassium: 3.4 mmol/L — ABNORMAL LOW (ref 3.5–5.2)
Sodium: 139 mmol/L (ref 134–144)
TOTAL PROTEIN: 7 g/dL (ref 6.0–8.5)

## 2018-02-13 NOTE — Telephone Encounter (Signed)
Appointment given.

## 2018-02-26 ENCOUNTER — Ambulatory Visit (INDEPENDENT_AMBULATORY_CARE_PROVIDER_SITE_OTHER): Payer: BLUE CROSS/BLUE SHIELD | Admitting: Physician Assistant

## 2018-02-26 ENCOUNTER — Encounter: Payer: Self-pay | Admitting: Physician Assistant

## 2018-02-26 VITALS — BP 149/92 | HR 100 | Temp 98.1°F | Ht 66.0 in | Wt 175.8 lb

## 2018-02-26 DIAGNOSIS — E876 Hypokalemia: Secondary | ICD-10-CM | POA: Diagnosis not present

## 2018-02-26 DIAGNOSIS — G629 Polyneuropathy, unspecified: Secondary | ICD-10-CM | POA: Diagnosis not present

## 2018-02-26 DIAGNOSIS — M898X9 Other specified disorders of bone, unspecified site: Secondary | ICD-10-CM

## 2018-02-26 DIAGNOSIS — R1084 Generalized abdominal pain: Secondary | ICD-10-CM | POA: Diagnosis not present

## 2018-02-26 DIAGNOSIS — G43809 Other migraine, not intractable, without status migrainosus: Secondary | ICD-10-CM

## 2018-02-26 DIAGNOSIS — M5136 Other intervertebral disc degeneration, lumbar region: Secondary | ICD-10-CM

## 2018-02-26 MED ORDER — AMITRIPTYLINE HCL 100 MG PO TABS: 100.0000 mg | ORAL_TABLET | Freq: Every day | ORAL | 5 refills | Status: DC

## 2018-02-26 MED ORDER — TOPIRAMATE 100 MG PO TABS: 100.0000 mg | ORAL_TABLET | Freq: Two times a day (BID) | ORAL | 11 refills | Status: DC

## 2018-02-26 NOTE — Patient Instructions (Signed)
In a few days you may receive a survey in the mail or online from [employer] regarding your visit with us today. Please take a moment to fill this out. Your feedback is very important to our whole office. It can help us better understand your needs as well as improve your experience and satisfaction. Thank you for taking your time to complete it. We care about you.  Jessicca Stitzer, PA-C  

## 2018-02-26 NOTE — Progress Notes (Signed)
BP (!) 149/92   Pulse 100   Temp 98.1 F (36.7 C) (Oral)   Ht 5' 6"  (1.676 m)   Wt 175 lb 12.8 oz (79.7 kg)   BMI 28.37 kg/m     Subjective:    Patient ID: [name], female    DOB: 1955/06/29, 62 y.o.   MRN: [id number]  HPI: [name] is a 62 y.o. female presenting on 02/26/2018 for Pain (2 week follow up)  Notably ready patient comes in recheck today on her significant complex set of problems.  She continues with severe deep pain to the spine neck head and hips.  All of the test that we had performed earlier were clear.  Hematology did another panel of blood.  She did have an elevation in her CRP her beta globulin with an M spike of 0.3 she was told that they were going to get a hematology appointment, but nothing has been made yet.  I am going to go ahead and make referral to Dr. Marin Olp for as soon as possible.  She is going on with the symptoms for over a month now.  Her potassium has begun to regulate.  She still has significant neuropathy throughout her entire body.  And this is different than her normal degenerative disc disease and nerve pinched neuropathy.  Past Medical History:  Diagnosis Date  . Anxiety   . Arthritis   . GERD (gastroesophageal reflux disease)   . Hyperlipidemia   . Hypertension    Relevant past medical, surgical, family and social history reviewed and updated as indicated. Interim medical history since our last visit reviewed. Allergies and medications reviewed and updated. DATA REVIEWED: CHART IN EPIC  Family History reviewed for pertinent findings.  Review of Systems  Constitutional: Positive for activity change, fatigue and unexpected weight change. Negative for fever.  HENT: Negative.   Eyes: Negative.   Respiratory: Negative.  Negative for shortness of breath and wheezing.   Gastrointestinal: Negative.   Genitourinary: Negative.   Musculoskeletal: Positive for arthralgias, gait problem, joint swelling and myalgias.  Skin: Positive  for color change.  Psychiatric/Behavioral: The patient is nervous/anxious.     Allergies as of 02/26/2018   No Known Allergies     Medication List        Accurate as of 02/26/18  4:34 PM. Always use your most recent med list.          amitriptyline 50 MG tablet Commonly known as:  ELAVIL TAKE 1 TABLET BY MOUTH ONCE DAILY AT BEDTIME   aspirin 81 MG chewable tablet Chew 81 mg by mouth daily.   atenolol 25 MG tablet Commonly known as:  TENORMIN Take 25 mg by mouth 2 (two) times daily.   DEXILANT 60 MG capsule Generic drug:  dexlansoprazole Take 1 capsule by mouth daily.   hydrochlorothiazide 25 MG tablet Commonly known as:  HYDRODIURIL Take 25 mg by mouth daily.   isosorbide mononitrate 60 MG 24 hr tablet Commonly known as:  IMDUR Take 60 mg by mouth daily.   oxyCODONE 60 MG 12 hr tablet Commonly known as:  OXYCONTIN Take 60 mg by mouth every 12 (twelve) hours.   oxyCODONE 60 MG 12 hr tablet Commonly known as:  OXYCONTIN Take 60 mg by mouth every 12 (twelve) hours.   oxyCODONE 60 MG 12 hr tablet Commonly known as:  OXYCONTIN Take 60 mg by mouth 2 (two) times daily.   potassium chloride 10 MEQ CR capsule Commonly known as:  MICRO-K Take 1 capsule (10 mEq total) by mouth 2 (two) times daily.   potassium chloride SA 20 MEQ tablet Commonly known as:  K-DUR,KLOR-CON Take 1 tablet (20 mEq total) by mouth daily.   rosuvastatin 5 MG tablet Commonly known as:  CRESTOR Take 5 mg by mouth daily.   tiZANidine 2 MG tablet Commonly known as:  ZANAFLEX Take 1 tablet (2 mg total) by mouth every 8 (eight) hours as needed for muscle spasms.   topiramate 100 MG tablet Commonly known as:  TOPAMAX Take 1 tablet (100 mg total) by mouth 2 (two) times daily.   vitamin C 500 MG tablet Commonly known as:  ASCORBIC ACID Take 500 mg by mouth daily.   zolpidem 5 MG tablet Commonly known as:  AMBIEN Take 1 tablet (5 mg total) by mouth at bedtime as needed for sleep.           Objective:    BP (!) 149/92   Pulse 100   Temp 98.1 F (36.7 C) (Oral)   Ht 5' 6"  (1.676 m)   Wt 175 lb 12.8 oz (79.7 kg)   BMI 28.37 kg/m    No Known Allergies  Wt Readings from Last 3 Encounters:  02/26/18 175 lb 12.8 oz (79.7 kg)  02/12/18 181 lb 6.4 oz (82.3 kg)  02/05/18 179 lb 9.6 oz (81.5 kg)    Physical Exam  Constitutional: She is oriented to person, place, and time. She appears well-developed and well-nourished. She appears distressed.  HENT:  Head: Normocephalic and atraumatic.  Eyes: Pupils are equal, round, and reactive to light. Conjunctivae and EOM are normal.  Cardiovascular: Normal rate, regular rhythm, normal heart sounds and intact distal pulses.  Pulmonary/Chest: Effort normal and breath sounds normal.  Abdominal: Soft. Bowel sounds are normal.  Neurological: She is alert and oriented to person, place, and time. She has normal reflexes.  Skin: Skin is warm and dry. No rash noted. She is not diaphoretic.  Psychiatric: She has a normal mood and affect. Her behavior is normal. Judgment and thought content normal.    Results for orders placed or performed in visit on 02/12/18  CMP14+EGFR  Result Value Ref Range   Glucose 125 (H) 65 - 99 mg/dL   BUN 9 8 - 27 mg/dL   Creatinine, Ser 0.96 0.57 - 1.00 mg/dL   GFR calc non Af Amer 63 >59 mL/min/1.73   GFR calc Af Amer 73 >59 mL/min/1.73   BUN/Creatinine Ratio 9 (L) 12 - 28   Sodium 139 134 - 144 mmol/L   Potassium 3.4 (L) 3.5 - 5.2 mmol/L   Chloride 93 (L) 96 - 106 mmol/L   CO2 27 20 - 29 mmol/L   Calcium 9.5 8.7 - 10.3 mg/dL   Total Protein 7.0 6.0 - 8.5 g/dL   Albumin 4.6 3.6 - 4.8 g/dL   Globulin, Total 2.4 1.5 - 4.5 g/dL   Albumin/Globulin Ratio 1.9 1.2 - 2.2   Bilirubin Total 0.3 0.0 - 1.2 mg/dL   Alkaline Phosphatase 140 (H) 39 - 117 IU/L   AST 25 0 - 40 IU/L   ALT 22 0 - 32 IU/L      Assessment & Plan:   1. Neuropathy - Ambulatory referral to Hematology  2. Bone pain - Ambulatory  referral to Hematology  3. Generalized abdominal pain - Ambulatory referral to Hematology  4. Hypokalemia  5. Other migraine without status migrainosus, not intractable - topiramate (TOPAMAX) 100 MG tablet; Take 1 tablet (100 mg  total) by mouth 2 (two) times daily.  Dispense: 60 tablet; Refill: 11  6. DDD (degenerative disc disease), lumbar   Continue all other maintenance medications as listed above.  Follow up plan: No follow-ups on file.  Educational handout given for [place] PA-C [facility] [address]  [place], [place] [address] [phone]   02/26/2018, 4:34 PM

## 2018-03-11 ENCOUNTER — Other Ambulatory Visit: Payer: Self-pay | Admitting: Hematology

## 2018-03-11 ENCOUNTER — Telehealth: Payer: Self-pay | Admitting: Physician Assistant

## 2018-03-11 DIAGNOSIS — D472 Monoclonal gammopathy: Secondary | ICD-10-CM

## 2018-03-11 NOTE — Telephone Encounter (Signed)
Pt wants to speak to [facility] nurse about her oxyCODONE (OXYCONTIN) 60 MG 12 hr tablet didn't want to give me anymore information.

## 2018-03-11 NOTE — Telephone Encounter (Signed)
Patient needs a prior authorization on her oxycodone. Patient just changed insurance to [name]. Pharmacy is going to fax and it needs to be under urgent. Patient is completely out.

## 2018-03-12 ENCOUNTER — Ambulatory Visit: Payer: BLUE CROSS/BLUE SHIELD

## 2018-03-14 ENCOUNTER — Other Ambulatory Visit: Payer: Self-pay | Admitting: Hematology

## 2018-03-14 ENCOUNTER — Telehealth: Payer: Self-pay | Admitting: Physician Assistant

## 2018-03-14 DIAGNOSIS — D472 Monoclonal gammopathy: Secondary | ICD-10-CM | POA: Insufficient documentation

## 2018-03-14 DIAGNOSIS — D72829 Elevated white blood cell count, unspecified: Secondary | ICD-10-CM | POA: Insufficient documentation

## 2018-03-14 NOTE — Telephone Encounter (Signed)
Please address

## 2018-03-14 NOTE — Assessment & Plan Note (Signed)
Patient has chronic degenerative disc disease, and the distribution of of the pain is not typical of bone pain secondary to lytic bone lesions.  As discussed above, we will determine if whole-body bone scan is indicated based on the aforementioned blood work.

## 2018-03-14 NOTE — Assessment & Plan Note (Signed)
Patient's distribution of neuropathy is consistent with radiculopathy secondary to degenerative disc disease. I will refer the patient to neurology for further evaluation.

## 2018-03-14 NOTE — Assessment & Plan Note (Signed)
I reviewed the patient's external documents, including PCP and rheumatology clinic notes. Patient has had long-standing chronic joint pain and neuropathy, for which she has undergone extensive work-up without clear etiology. She was referred to rheumatology in September 2019; the work-up was unremarkable except mildly elevated CRP and an spike of 0.3 g/dL (no additional information available).  I reviewed the patient's recent labs, which showed normal Hgb, Cr, and serum Calcium, suggesting that the low M-spike likely represents MGUS.  Furthermore, in the setting of chronic inflammation, low M-protein may be false positive.  I have ordered SPEP with IFE, free light chains, quantitative immunoglobulins to further assess the recent abnormal M protein. Based on the results above, I will determine if whole-body bone scan (PET) is indicated.

## 2018-03-14 NOTE — Assessment & Plan Note (Deleted)
Patient has had borderline leukocytosis dating back to beginning of 2019; no records of labs prior to 2019. I reviewed the peripheral blood smear, which showed ___ Given the normal differential, Hgb, and platelets, the borderline leukocytosis is most likely reactive in the setting of chronic inflammation. I do not recommend further work-up at this time.

## 2018-03-14 NOTE — Telephone Encounter (Signed)
What do I need to address? Do I need to send or is it time for the PA to be done?

## 2018-03-15 ENCOUNTER — Encounter: Payer: Self-pay | Admitting: Physician Assistant

## 2018-03-15 ENCOUNTER — Inpatient Hospital Stay: Payer: Managed Care, Other (non HMO) | Attending: Hematology | Admitting: Hematology

## 2018-03-15 ENCOUNTER — Encounter: Payer: Self-pay | Admitting: Hematology

## 2018-03-15 ENCOUNTER — Inpatient Hospital Stay: Payer: Managed Care, Other (non HMO)

## 2018-03-15 ENCOUNTER — Telehealth: Payer: Self-pay

## 2018-03-15 ENCOUNTER — Other Ambulatory Visit: Payer: Self-pay | Admitting: Physician Assistant

## 2018-03-15 ENCOUNTER — Other Ambulatory Visit: Payer: Self-pay

## 2018-03-15 VITALS — BP 132/76 | HR 80 | Temp 97.8°F | Resp 18 | Wt 173.0 lb

## 2018-03-15 DIAGNOSIS — L989 Disorder of the skin and subcutaneous tissue, unspecified: Secondary | ICD-10-CM | POA: Diagnosis not present

## 2018-03-15 DIAGNOSIS — D472 Monoclonal gammopathy: Secondary | ICD-10-CM | POA: Insufficient documentation

## 2018-03-15 DIAGNOSIS — M5136 Other intervertebral disc degeneration, lumbar region: Secondary | ICD-10-CM

## 2018-03-15 DIAGNOSIS — M4727 Other spondylosis with radiculopathy, lumbosacral region: Secondary | ICD-10-CM

## 2018-03-15 DIAGNOSIS — D72829 Elevated white blood cell count, unspecified: Secondary | ICD-10-CM | POA: Diagnosis not present

## 2018-03-15 DIAGNOSIS — D473 Essential (hemorrhagic) thrombocythemia: Secondary | ICD-10-CM

## 2018-03-15 DIAGNOSIS — G629 Polyneuropathy, unspecified: Secondary | ICD-10-CM | POA: Diagnosis not present

## 2018-03-15 DIAGNOSIS — Z85828 Personal history of other malignant neoplasm of skin: Secondary | ICD-10-CM | POA: Diagnosis not present

## 2018-03-15 DIAGNOSIS — M549 Dorsalgia, unspecified: Principal | ICD-10-CM

## 2018-03-15 DIAGNOSIS — I10 Essential (primary) hypertension: Secondary | ICD-10-CM | POA: Diagnosis not present

## 2018-03-15 DIAGNOSIS — D75839 Thrombocytosis, unspecified: Secondary | ICD-10-CM

## 2018-03-15 DIAGNOSIS — G8929 Other chronic pain: Secondary | ICD-10-CM

## 2018-03-15 LAB — CBC WITH DIFFERENTIAL (CANCER CENTER ONLY)
Abs Immature Granulocytes: 0.04 10*3/uL (ref 0.00–0.07)
Basophils Absolute: 0.1 10*3/uL (ref 0.0–0.1)
Basophils Relative: 1 %
Eosinophils Absolute: 0.4 10*3/uL (ref 0.0–0.5)
Eosinophils Relative: 5 %
HCT: 44.8 % (ref 36.0–46.0)
HEMOGLOBIN: 14.6 g/dL (ref 12.0–15.0)
Immature Granulocytes: 1 %
LYMPHS PCT: 29 %
Lymphs Abs: 2.4 10*3/uL (ref 0.7–4.0)
MCH: 31.1 pg (ref 26.0–34.0)
MCHC: 32.6 g/dL (ref 30.0–36.0)
MCV: 95.5 fL (ref 80.0–100.0)
Monocytes Absolute: 1 10*3/uL (ref 0.1–1.0)
Monocytes Relative: 12 %
Neutro Abs: 4.4 10*3/uL (ref 1.7–7.7)
Neutrophils Relative %: 52 %
Platelet Count: 388 10*3/uL (ref 150–400)
RBC: 4.69 MIL/uL (ref 3.87–5.11)
RDW: 13 % (ref 11.5–15.5)
WBC Count: 8.2 10*3/uL (ref 4.0–10.5)
nRBC: 0 % (ref 0.0–0.2)

## 2018-03-15 LAB — CMP (CANCER CENTER ONLY)
ALT: 19 U/L (ref 0–44)
AST: 26 U/L (ref 15–41)
Albumin: 3.7 g/dL (ref 3.5–5.0)
Alkaline Phosphatase: 154 U/L — ABNORMAL HIGH (ref 38–126)
Anion gap: 13 (ref 5–15)
BUN: 7 mg/dL — ABNORMAL LOW (ref 8–23)
CHLORIDE: 96 mmol/L — AB (ref 98–111)
CO2: 28 mmol/L (ref 22–32)
CREATININE: 0.9 mg/dL (ref 0.44–1.00)
Calcium: 10 mg/dL (ref 8.9–10.3)
GFR, Estimated: >60 mL/min (ref >60)
GLUCOSE: 122 mg/dL — AB (ref 70–99)
Potassium: 3.1 mmol/L — ABNORMAL LOW (ref 3.5–5.1)
SODIUM: 137 mmol/L (ref 135–145)
Total Bilirubin: 0.5 mg/dL (ref 0.3–1.2)
Total Protein: 7.1 g/dL (ref 6.5–8.1)

## 2018-03-15 LAB — SEDIMENTATION RATE: Sed Rate: 14 mm/hr (ref 0–22)

## 2018-03-15 LAB — C-REACTIVE PROTEIN: CRP: 2.5 mg/dL — AB (ref <1.0)

## 2018-03-15 LAB — SAVE SMEAR (SSMR)

## 2018-03-15 LAB — LACTATE DEHYDROGENASE: LDH: 197 U/L — ABNORMAL HIGH (ref 98–192)

## 2018-03-15 MED ORDER — OXYCODONE HCL ER 60 MG PO T12A: 60.0000 mg | EXTENDED_RELEASE_TABLET | Freq: Two times a day (BID) | ORAL | 0 refills | Status: DC

## 2018-03-15 NOTE — Telephone Encounter (Signed)
I have spoken with the patient explained what has gone on with her denial of her Roxie Contin 60 mg.  She does have one prescription left at [facility] in one cuff.  Of asked her to talk to them to possibly by a week's worth of medicine rather than a months worth.  Within the prescription will be void.  After that the patient may need to have pay for the medication cash price for a month or 2.  I am writing a letter for her to take with a printed prescription to some local pharmacies to see if she can find a better price for now.  Dear Pharmacist,  This patient has [employer].  We have received a letter of denial on her chronic oxycodone extended release 60 mg twice daily.    The requirements are : quarterly opioid office visits, which she does.   Additional precaution and education concerning overdose, which we do. The final requirement is the medication has to be prescribed by a board certified pain management specialist or [place] in coordination with that which we are not.  A referral has been made, this can take several weeks before she can get in.  Therefore I have asked the patient to take a printed prescription with this letter accompanying the it to some local pharmacies to see if she can get a better cash price until we can meet these requirements.  She has been on this medication for a long time and has severe heart disease and cannot take NSAIDs.  She has extremely severe degenerative disc disease with permanent neuropathy and nerve damage.  Thank you for helping this patient.  I am including a copy of the denial letter with this letter and her printed prescription.  Please call if you have any questions.  Particia Nearing, PA-C Terald Sleeper PA-C [facility] [address]  [place], [place] [address] [phone]

## 2018-03-15 NOTE — Telephone Encounter (Signed)
In process 

## 2018-03-15 NOTE — Telephone Encounter (Signed)
Insurance denied prior auth for Oxycodone due to patient not having a pain management specialist in coordination with you.

## 2018-03-15 NOTE — Telephone Encounter (Signed)
OXYCONTIN was med, was dictated error.    FYI

## 2018-03-15 NOTE — Progress Notes (Signed)
[place] NOTE  Patient Care Team: Theodoro Clock as PCP - General (Physician Assistant)  HEME/ONC OVERVIEW: 1. Monoclonal protein  ASSESSMENT & PLAN:  Monoclonal protein -I reviewed the patient's external documents, including PCP in the rheumatology clinic notes. -Briefly, patient was recently referred to rheumatology for evaluation of generalized joint pain; her work-up was relatively unremarkable except mildly elevated CRP and reportedly an M spike of 0.3 g/dL (I was unable to locate the lab documenting the M-protein) -I have ordered SPEP, IFE, free light chains, beta-2 microglobulin to further assess any presence of monoclonal protein -Given the patient's generalized bone pain, I will also order whole-body PET scan to assess the presence of any lytic bone lesion -If the above work-up does not reveal high suspicion for multiple myeloma (such as anemia, kidney dysfunction, hypercalcemia, or lytic bone lesion), then bone marrow biopsy is likely low yield, and the presence of M-spike would be consistent with MGUS   Leukocytosis -Patient has had borderline leukocytosis dating back to beginning of 2019; no records of labs prior to 2019. -I reviewed the peripheral blood smear, which showed relatively normal WBC morphology except occasional atypical lymphocytes, which was nonspecific.  There was no significant dysplastic change or circulating blast. -Given the normal differential, Hgb, and platelets, the borderline leukocytosis is most likely reactive. -I do not recommend further work-up at this time.  Bilateral lower extremity neuropathy -The distribution and chronicity of the neuropathy in the bilateral lower extremities suggests possible radiculopathy -I discussed with the patient that the neuropathy is unlikely to be related to the presence of monoclonal protein, especially given its low level -I have referred patient to neurology for further  evaluation  Right lower extremity skin lesion -Patient has history of skin cancer and is followed by a local dermatologist -The appearance of the raised skin lesion in the right lower extremity may be actinic keratosis -Patient expressed preference to contact her dermatologist herself to set up an appointment for further evaluation -I encouraged patient to set up follow-up within the next 2 to 3 weeks to avoid delay, as it may require local excision  Erythematous skin plaques -Patient reports that the scattered erythematous skin rash improved previously with p.o. steroids, suggesting possible eczema -I encouraged patient to make an appointment with her dermatology as soon as possible for further evaluation  Orders Placed This Encounter  Procedures  . NM PET Image Initial (PI) Whole Body    Standing Status:   Future    Standing Expiration Date:   03/15/2019    Order Specific Question:   If indicated for the ordered procedure, I authorize the administration of a radiopharmaceutical per Radiology protocol    Answer:   Yes    Order Specific Question:   Preferred imaging location?    Answer:   [facility]    Order Specific Question:   Radiology Contrast Protocol - do NOT remove file path    Answer:   [email or web address]  . CBC with Differential ([place] Only)    Standing Status:   Future    Standing Expiration Date:   04/19/2019  . CMP ([place] only)    Standing Status:   Future    Standing Expiration Date:   04/19/2019  . Ambulatory referral to Neurology    Referral Priority:   Routine    Referral Type:   Consultation    Referral Reason:   Specialty Services Required    Requested Specialty:   Neurology  Number of Visits Requested:   1   All questions were answered. The patient knows to call the clinic with any problems, questions or concerns.  Return to clinic in 3 weeks for lab/imaging results and clinic follow-up.  Tish Men,  MD 03/15/2018 2:49 PM   CHIEF COMPLAINTS/PURPOSE OF CONSULTATION:  "I am here for my joint pain and rash"  HISTORY OF PRESENTING ILLNESS:  [name] 62 y.o. female is here because of monoclonal protein.   Patient reports that she has had chronic generalized joint pain, involving her hips, shoulder, back, and knees, for the past a few years.  She was hospitalized in August 2019 for pneumonia and was treated with p.o. Augmentin.  She reports that since her hospitalization, her generalized body pain has gotten much worse.  In addition, she reports worsening neuropathy (numbness and burning) radiating from the hip to the foot in bilateral lower extremities.  She also reports worsening scattered erythematous skin plaques, and a new raised skin lesion over the right anterior shin since her hospitalization in August 2019.  She reports a weight loss of 10 to 15 pounds over the past 2 to 3 months due to decreased appetite and stable exertional dyspnea, but denies any fever, chill, night sweats, lymphadenopathy, or chest pain.    No history exists.    MEDICAL HISTORY:  Past Medical History:  Diagnosis Date  . Anxiety   . Arthritis   . GERD (gastroesophageal reflux disease)   . Hyperlipidemia   . Hypertension     SURGICAL HISTORY: Past Surgical History:  Procedure Laterality Date  . ABDOMINAL HYSTERECTOMY    . CERVICAL FUSION    . KNEE ARTHROSCOPY Left   . LUMBAR FUSION      SOCIAL HISTORY: Social History   Socioeconomic History  . Marital status: Married    Spouse name: Not on file  . Number of children: Not on file  . Years of education: Not on file  . Highest education level: Not on file  Occupational History  . Not on file  Social Needs  . Financial resource strain: Not on file  . Food insecurity:    Worry: Not on file    Inability: Not on file  . Transportation needs:    Medical: Not on file    Non-medical: Not on file  Tobacco Use  . Smoking status: Never Smoker   . Smokeless tobacco: Never Used  Substance and Sexual Activity  . Alcohol use: No  . Drug use: No  . Sexual activity: Not on file  Lifestyle  . Physical activity:    Days per week: Not on file    Minutes per session: Not on file  . Stress: Not on file  Relationships  . Social connections:    Talks on phone: Not on file    Gets together: Not on file    Attends religious service: Not on file    Active member of club or organization: Not on file    Attends meetings of clubs or organizations: Not on file    Relationship status: Not on file  . Intimate partner violence:    Fear of current or ex partner: Not on file    Emotionally abused: Not on file    Physically abused: Not on file    Forced sexual activity: Not on file  Other Topics Concern  . Not on file  Social History Narrative  . Not on file    FAMILY HISTORY: Family History  Problem Relation Age of Onset  . Congestive Heart Failure Mother   . Arthritis Mother   . Cancer Father        lung  . Arthritis/Rheumatoid Maternal Grandmother   . Cancer Paternal Grandfather        skin    ALLERGIES:  has No Known Allergies.  MEDICATIONS:  Current Outpatient Medications  Medication Sig Dispense Refill  . amitriptyline (ELAVIL) 100 MG tablet Take 1 tablet (100 mg total) by mouth at bedtime. 60 tablet 5  . aspirin 81 MG chewable tablet Chew 81 mg by mouth daily.    Marland Kitchen atenolol (TENORMIN) 25 MG tablet Take 25 mg by mouth 2 (two) times daily.   2  . DEXILANT 60 MG capsule Take 1 capsule by mouth daily.  0  . hydrochlorothiazide (HYDRODIURIL) 25 MG tablet Take 25 mg by mouth daily.    . isosorbide mononitrate (IMDUR) 60 MG 24 hr tablet Take 60 mg by mouth daily.    Marland Kitchen oxyCODONE (OXYCONTIN) 60 MG 12 hr tablet Take 60 mg by mouth every 12 (twelve) hours. 60 each 0  . rosuvastatin (CRESTOR) 5 MG tablet Take 5 mg by mouth daily.    Marland Kitchen tiZANidine (ZANAFLEX) 2 MG tablet Take 1 tablet (2 mg total) by mouth every 8 (eight) hours as  needed for muscle spasms. (Patient taking differently: Take 1 mg by mouth every 8 (eight) hours as needed for muscle spasms. ) 90 tablet 2  . topiramate (TOPAMAX) 100 MG tablet Take 1 tablet (100 mg total) by mouth 2 (two) times daily. 60 tablet 11  . vitamin C (ASCORBIC ACID) 500 MG tablet Take 500 mg by mouth daily.    Marland Kitchen zolpidem (AMBIEN) 5 MG tablet Take 1 tablet (5 mg total) by mouth at bedtime as needed for sleep. 30 tablet 1   No current facility-administered medications for this visit.     REVIEW OF SYSTEMS:   Constitutional: ( - ) fevers, ( - )  chills , ( - ) night sweats Eyes: ( - ) blurriness of vision, ( - ) double vision, ( - ) watery eyes Ears, nose, mouth, throat, and face: ( - ) mucositis, ( - ) sore throat Respiratory: ( - ) cough, ( + ) stable exertional dyspnea, ( - ) wheezes Cardiovascular: ( - ) palpitation, ( - ) chest discomfort, ( - ) lower extremity swelling Gastrointestinal:  ( + ) nausea, ( - ) heartburn, ( - ) change in bowel habits Skin: ( + ) abnormal skin rashes Lymphatics: ( - ) new lymphadenopathy, ( - ) easy bruising Neurological: ( + ) numbness, ( + ) tingling, ( - ) new weaknesses Behavioral/Psych: ( - ) mood change, ( - ) new changes  All other systems were reviewed with the patient and are negative.  PHYSICAL EXAMINATION: ECOG PERFORMANCE STATUS: 2 - Symptomatic, <50% confined to bed  Vitals:   03/15/18 1401  BP: 132/76  Pulse: 80  Resp: 18  Temp: 97.8 F (36.6 C)  SpO2: 99%   Filed Weights   03/15/18 1401  Weight: 173 lb (78.5 kg)    GENERAL: alert, no distress and comfortable, mildly anxious appearing SKIN: scattered brown, raised skin plaques over the trunk and extremities; a few patches of raised erythematous skin plaques over the chest and upper extremities; a raised, ~1.5cm skin lesion over the right anterior shin, mildly tender with palpation, no discharge  EYES: conjunctiva are pink and non-injected, sclera clear OROPHARYNX: no  exudate,  no erythema; lips, buccal mucosa, and tongue normal  NECK: supple, non-tender LYMPH:  no palpable lymphadenopathy in the cervical or axillary LUNGS: clear to auscultation and percussion with normal breathing effort HEART: regular rate & rhythm and no murmurs and no lower extremity edema ABDOMEN: soft, non-tender, non-distended, normal bowel sounds Musculoskeletal: no cyanosis of digits and no clubbing  PSYCH: alert & oriented x 3, fluent speech NEURO: no focal motor/sensory deficits  LABORATORY DATA:  I have reviewed the data as listed Lab Results  Component Value Date   WBC 8.2 03/15/2018   HGB 14.6 03/15/2018   HCT 44.8 03/15/2018   MCV 95.5 03/15/2018   PLT 388 03/15/2018   Lab Results  Component Value Date   NA 139 02/12/2018   K 3.4 (L) 02/12/2018   CL 93 (L) 02/12/2018   CO2 27 02/12/2018    I personally reviewed the patient's peripheral blood smear today.  There was no peripheral blast.  The white blood cells were of normal morphology except occasional atypical lymphocytes.  The red blood cells were of normal morphology. There was no schistocytosis.  The platelets are of normal size and I have verified that there were no platelet clumping.

## 2018-03-16 LAB — BETA 2 MICROGLOBULIN, SERUM: Beta-2 Microglobulin: 3.4 mg/L — ABNORMAL HIGH (ref 0.6–2.4)

## 2018-03-18 LAB — KAPPA/LAMBDA LIGHT CHAINS
Kappa free light chain: 15 mg/L (ref 3.3–19.4)
Kappa, lambda light chain ratio: 0.85 (ref 0.26–1.65)
Lambda free light chains: 17.6 mg/L (ref 5.7–26.3)

## 2018-03-19 ENCOUNTER — Encounter: Payer: Self-pay | Admitting: Neurology

## 2018-03-19 LAB — MULTIPLE MYELOMA PANEL, SERUM
ALPHA2 GLOB SERPL ELPH-MCNC: 0.8 g/dL (ref 0.4–1.0)
Albumin SerPl Elph-Mcnc: 3.7 g/dL (ref 2.9–4.4)
Albumin/Glob SerPl: 1.3 (ref 0.7–1.7)
Alpha 1: 0.3 g/dL (ref 0.0–0.4)
B-GLOBULIN SERPL ELPH-MCNC: 1.3 g/dL (ref 0.7–1.3)
Gamma Glob SerPl Elph-Mcnc: 0.5 g/dL (ref 0.4–1.8)
Globulin, Total: 2.9 g/dL (ref 2.2–3.9)
IGG (IMMUNOGLOBIN G), SERUM: 622 mg/dL — AB (ref 700–1600)
IgA: 335 mg/dL (ref 87–352)
IgM (Immunoglobulin M), Srm: 29 mg/dL (ref 26–217)
Total Protein ELP: 6.6 g/dL (ref 6.0–8.5)

## 2018-03-22 ENCOUNTER — Encounter: Payer: Self-pay | Admitting: Physician Assistant

## 2018-03-22 ENCOUNTER — Ambulatory Visit: Payer: Managed Care, Other (non HMO) | Admitting: Physician Assistant

## 2018-03-22 DIAGNOSIS — M4727 Other spondylosis with radiculopathy, lumbosacral region: Secondary | ICD-10-CM | POA: Diagnosis not present

## 2018-03-22 DIAGNOSIS — Z23 Encounter for immunization: Secondary | ICD-10-CM

## 2018-03-22 DIAGNOSIS — M5136 Other intervertebral disc degeneration, lumbar region: Secondary | ICD-10-CM | POA: Diagnosis not present

## 2018-03-22 MED ORDER — OXYCODONE HCL ER 60 MG PO T12A: 60.0000 mg | EXTENDED_RELEASE_TABLET | Freq: Two times a day (BID) | ORAL | 0 refills | Status: DC

## 2018-03-22 MED ORDER — ZOLPIDEM TARTRATE 5 MG PO TABS: 5.0000 mg | ORAL_TABLET | Freq: Every evening | ORAL | 1 refills | Status: DC | PRN

## 2018-03-22 NOTE — Progress Notes (Signed)
BP 115/75   Pulse 95   Ht 5\' 6"  (1.676 m)   Wt 174 lb (78.9 kg)   BMI 28.08 kg/m    Subjective:    Patient ID: [name], female    DOB: 03-31-1956, 62 y.o.   MRN: [id number]  HPI: [name] is a 62 y.o. female presenting on 03/22/2018 for Hypertension (3 month ) and Pain This patient comes in for periodic recheck on medications and conditions including hypertension and chronic pain due to severe DDD and neuropathy.  She is under the care of hematology for abnormal pain and labs.   All medications are reviewed today. There are no reports of any problems with the medications. All of the medical conditions are reviewed and updated.  Lab work is reviewed and will be ordered as medically necessary. There are no new problems reported with today's visit.    Past Medical History:  Diagnosis Date  . Anxiety   . Arthritis   . GERD (gastroesophageal reflux disease)   . Hyperlipidemia   . Hypertension    Relevant past medical, surgical, family and social history reviewed and updated as indicated. Interim medical history since our last visit reviewed. Allergies and medications reviewed and updated. DATA REVIEWED: CHART IN EPIC  Family History reviewed for pertinent findings.  Review of Systems  Constitutional: Negative.   HENT: Negative.   Eyes: Negative.   Respiratory: Negative.   Gastrointestinal: Negative.   Genitourinary: Negative.   Musculoskeletal: Positive for arthralgias, back pain, joint swelling and myalgias.    Allergies as of 03/22/2018   No Known Allergies     Medication List        Accurate as of 03/22/18  2:27 PM. Always use your most recent med list.          amitriptyline 100 MG tablet Commonly known as:  ELAVIL Take 1 tablet (100 mg total) by mouth at bedtime.   aspirin 81 MG chewable tablet Chew 81 mg by mouth daily.   atenolol 25 MG tablet Commonly known as:  TENORMIN Take 25 mg by mouth 2 (two) times daily.   DEXILANT 60 MG  capsule Generic drug:  dexlansoprazole Take 1 capsule by mouth daily.   hydrochlorothiazide 25 MG tablet Commonly known as:  HYDRODIURIL Take 25 mg by mouth daily.   isosorbide mononitrate 60 MG 24 hr tablet Commonly known as:  IMDUR Take 60 mg by mouth daily.   oxyCODONE 60 MG 12 hr tablet Commonly known as:  OXYCONTIN Take 60 mg by mouth every 12 (twelve) hours.   oxyCODONE 60 MG 12 hr tablet Commonly known as:  OXYCONTIN Take 60 mg by mouth 2 (two) times daily.   oxyCODONE 60 MG 12 hr tablet Commonly known as:  OXYCONTIN Take 60 mg by mouth 2 (two) times daily.   rosuvastatin 5 MG tablet Commonly known as:  CRESTOR Take 5 mg by mouth daily.   tiZANidine 2 MG tablet Commonly known as:  ZANAFLEX Take 1 tablet (2 mg total) by mouth every 8 (eight) hours as needed for muscle spasms.   topiramate 100 MG tablet Commonly known as:  TOPAMAX Take 1 tablet (100 mg total) by mouth 2 (two) times daily.   vitamin C 500 MG tablet Commonly known as:  ASCORBIC ACID Take 500 mg by mouth daily.   zolpidem 5 MG tablet Commonly known as:  AMBIEN Take 1 tablet (5 mg total) by mouth at bedtime as needed for sleep.  Objective:    BP 115/75   Pulse 95   Ht 5\' 6"  (1.676 m)   Wt 174 lb (78.9 kg)   BMI 28.08 kg/m   No Known Allergies  Wt Readings from Last 3 Encounters:  03/22/18 174 lb (78.9 kg)  03/15/18 173 lb (78.5 kg)  02/26/18 175 lb 12.8 oz (79.7 kg)    Physical Exam  Constitutional: She is oriented to person, place, and time. She appears well-developed and well-nourished.  HENT:  Head: Normocephalic and atraumatic.  Eyes: Pupils are equal, round, and reactive to light. Conjunctivae and EOM are normal.  Cardiovascular: Normal rate, regular rhythm, normal heart sounds and intact distal pulses.  Pulmonary/Chest: Effort normal and breath sounds normal.  Abdominal: Soft. Bowel sounds are normal.  Neurological: She is alert and oriented to person, place, and  time. She has normal reflexes.  Skin: Skin is warm and dry. No rash noted.  Psychiatric: She has a normal mood and affect. Her behavior is normal. Judgment and thought content normal.        Assessment & Plan:   1. DDD (degenerative disc disease), lumbar - oxyCODONE (OXYCONTIN) 60 MG 12 hr tablet; Take 60 mg by mouth every 12 (twelve) hours.  Dispense: 60 each; Refill: 0  2. Osteoarthritis of spine with radiculopathy, lumbosacral region - oxyCODONE (OXYCONTIN) 60 MG 12 hr tablet; Take 60 mg by mouth every 12 (twelve) hours.  Dispense: 60 each; Refill: 0   Continue all other maintenance medications as listed above.  Follow up plan: Return in about 12 weeks (around 06/14/2018) for recheck.  Educational handout given for [place] PA-C [facility] [address]  [place], [place] [address] [phone]   03/22/2018, 2:27 PM

## 2018-03-22 NOTE — Patient Instructions (Signed)
In a few days you may receive a survey in the mail or online from [employer] regarding your visit with us today. Please take a moment to fill this out. Your feedback is very important to our whole office. It can help us better understand your needs as well as improve your experience and satisfaction. Thank you for taking your time to complete it. We care about you.  Kairav Russomanno, PA-C  

## 2018-04-04 ENCOUNTER — Encounter (HOSPITAL_COMMUNITY)
Admission: RE | Admit: 2018-04-04 | Discharge: 2018-04-04 | Disposition: A | Discharge status: Home / Self Care | Payer: Managed Care, Other (non HMO) | Source: Ambulatory Visit | Attending: Hematology | Admitting: Hematology

## 2018-04-04 DIAGNOSIS — D472 Monoclonal gammopathy: Secondary | ICD-10-CM | POA: Diagnosis present

## 2018-04-04 LAB — GLUCOSE, CAPILLARY: GLUCOSE-CAPILLARY: 109 mg/dL — AB (ref 70–99)

## 2018-04-04 MED ORDER — FLUDEOXYGLUCOSE F - 18 (FDG) INJECTION
8.4000 | Freq: Once | INTRAVENOUS | Status: AC
  Administered 2018-04-04: 8.4 via INTRAVENOUS

## 2018-04-05 ENCOUNTER — Ambulatory Visit: Payer: Managed Care, Other (non HMO) | Admitting: Hematology

## 2018-04-05 ENCOUNTER — Other Ambulatory Visit: Payer: Managed Care, Other (non HMO)

## 2018-04-12 ENCOUNTER — Encounter: Payer: Self-pay | Admitting: Hematology

## 2018-04-12 ENCOUNTER — Inpatient Hospital Stay (HOSPITAL_BASED_OUTPATIENT_CLINIC_OR_DEPARTMENT_OTHER): Payer: Managed Care, Other (non HMO) | Admitting: Hematology

## 2018-04-12 ENCOUNTER — Inpatient Hospital Stay: Payer: Managed Care, Other (non HMO) | Attending: Hematology

## 2018-04-12 ENCOUNTER — Other Ambulatory Visit: Payer: Self-pay

## 2018-04-12 VITALS — BP 135/75 | HR 96 | Temp 97.6°F | Resp 18 | Ht 66.0 in | Wt 172.4 lb

## 2018-04-12 DIAGNOSIS — S22000A Wedge compression fracture of unspecified thoracic vertebra, initial encounter for closed fracture: Secondary | ICD-10-CM | POA: Insufficient documentation

## 2018-04-12 DIAGNOSIS — L989 Disorder of the skin and subcutaneous tissue, unspecified: Secondary | ICD-10-CM

## 2018-04-12 DIAGNOSIS — G629 Polyneuropathy, unspecified: Secondary | ICD-10-CM

## 2018-04-12 DIAGNOSIS — D72829 Elevated white blood cell count, unspecified: Secondary | ICD-10-CM

## 2018-04-12 DIAGNOSIS — D472 Monoclonal gammopathy: Secondary | ICD-10-CM

## 2018-04-12 LAB — CBC WITH DIFFERENTIAL (CANCER CENTER ONLY)
ABS IMMATURE GRANULOCYTES: 0.03 10*3/uL (ref 0.00–0.07)
BASOS ABS: 0.1 10*3/uL (ref 0.0–0.1)
Basophils Relative: 1 %
EOS ABS: 0.5 10*3/uL (ref 0.0–0.5)
Eosinophils Relative: 5 %
HEMATOCRIT: 44.2 % (ref 36.0–46.0)
Hemoglobin: 14.3 g/dL (ref 12.0–15.0)
IMMATURE GRANULOCYTES: 0 %
LYMPHS ABS: 3.3 10*3/uL (ref 0.7–4.0)
LYMPHS PCT: 35 %
MCH: 31 pg (ref 26.0–34.0)
MCHC: 32.4 g/dL (ref 30.0–36.0)
MCV: 95.9 fL (ref 80.0–100.0)
Monocytes Absolute: 0.9 10*3/uL (ref 0.1–1.0)
Monocytes Relative: 10 %
NEUTROS PCT: 49 %
NRBC: 0 % (ref 0.0–0.2)
Neutro Abs: 4.6 10*3/uL (ref 1.7–7.7)
Platelet Count: 387 10*3/uL (ref 150–400)
RBC: 4.61 MIL/uL (ref 3.87–5.11)
RDW: 13.1 % (ref 11.5–15.5)
WBC Count: 9.5 10*3/uL (ref 4.0–10.5)

## 2018-04-12 LAB — CMP (CANCER CENTER ONLY)
ALK PHOS: 102 U/L (ref 38–126)
ALT: 14 U/L (ref 0–44)
AST: 24 U/L (ref 15–41)
Albumin: 3.8 g/dL (ref 3.5–5.0)
Anion gap: 10 (ref 5–15)
BILIRUBIN TOTAL: 0.5 mg/dL (ref 0.3–1.2)
BUN: 8 mg/dL (ref 8–23)
CO2: 32 mmol/L (ref 22–32)
Calcium: 9.9 mg/dL (ref 8.9–10.3)
Chloride: 97 mmol/L — ABNORMAL LOW (ref 98–111)
Creatinine: 1.1 mg/dL — ABNORMAL HIGH (ref 0.44–1.00)
GFR, Estimated: 52 mL/min — ABNORMAL LOW (ref >60)
Glucose, Bld: 121 mg/dL — ABNORMAL HIGH (ref 70–99)
Potassium: 3.4 mmol/L — ABNORMAL LOW (ref 3.5–5.1)
Sodium: 139 mmol/L (ref 135–145)
TOTAL PROTEIN: 7 g/dL (ref 6.5–8.1)

## 2018-04-12 NOTE — Progress Notes (Signed)
[place] OFFICE PROGRESS NOTE  Patient Care Team: Theodoro Clock as PCP - General (Physician Assistant)  HEME/ONC OVERVIEW: 1. Hx of monoclonal protein -Labs from rheumatology clinic reported showed a low monoclonal protein -03/2018: no monoclonal protein on SPEP, IFE negative, normal light chain, normal immunoglobulins; PET showed no definite lytic bone lesion; nl CBC, Cr, and calcium  ASSESSMENT & PLAN:  Hx of Monoclonal protein -I independently reviewed the radiologic images of recent PET study, and agree with the findings as documented -PET scan did not show any definite lytic bone lesion to suggest multiple myeloma -Furthermore, hematologic work-up, including SPEP, IFE, quantitative immunoglobulins, and free light chains, did not identify the presence of monoclonal protein -In light of normal CBC, creatinine, calcium, and negative myeloma panel, the likelihood of multiple myeloma or MGUS is very low -Given the low suspicion for plasma cell dyscrasia, bone marrow biopsy is unlikely to yield additional information  Incidental possible T11 compression fracture -PET in October 2019 showed a questionable incidental, possibly new compression fracture at T11 -I offered to refer the patient to orthopedic surgery, but patient declined and stated that she will follow-up with her PCP  Bilateral lower extremity neuropathy -The distribution and chronicity of the neuropathy in the bilateral lower extremities suggests possible radiculopathy -As MM work-up did not reveal any monoclonal protein, this is unlikely to be hematologically related  -Patient has neurology clinic appt in January 2020   Right lower extremity skin lesion -PET in October 2019 showed small foci of increased FDG avidity along the cutaneous margins of both calves -Given her history of skin cancer, raised skin lesion over the right anterior shin is concerning for actinic keratosis/squamous cell  carcinoma -Her appt with dermatology is scheduled in the end of 04/2018; I strongly encouraged patient to keep that appointment  All questions were answered. The patient knows to call the clinic with any problems, questions or concerns. No barriers to learning was detected.  A total of more than 25 minutes were spent face-to-face with the patient during this encounter and over half of that time was spent on counseling and coordination of care as outlined above.   Given the negative myeloma work-up, there is very low suspicion for plasma cell dyscrasia.  Therefore, patient can return to the hematology clinic as needed.  Tish Men, MD 04/12/2018 2:35 PM  CHIEF COMPLAINT: "I am still not feeling well"  INTERVAL HISTORY: [name] returns to hematology clinic for follow-up of recent myeloma work-up and the PET scan results.  She reports that she continues to have chronic pain in the back, arms, and legs, as well as chronic numbness/tingling in lower extremities.  She also notes chronic scattered erythematous patches over the upper and lower extremities.  At the last visit, she was instructed to contact her dermatologist to set up an appointment, which is currently scheduled in the end of the month.  She denies any fever, chills, unexplained weight loss, night sweats, or lymphadenopathy.  SUMMARY OF ONCOLOGIC HISTORY:  No history exists.    REVIEW OF SYSTEMS:   Constitutional: ( - ) fevers, ( - )  chills , ( - ) night sweats Eyes: ( - ) blurriness of vision, ( - ) double vision, ( - ) watery eyes Ears, nose, mouth, throat, and face: ( - ) mucositis, ( - ) sore throat Respiratory: ( - ) cough, ( - ) dyspnea, ( - ) wheezes Cardiovascular: ( - ) palpitation, ( - ) chest  discomfort, ( + ) lower extremity swelling Gastrointestinal:  ( - ) nausea, ( - ) heartburn, ( - ) change in bowel habits Skin: ( - ) abnormal skin rashes Lymphatics: ( - ) new lymphadenopathy, ( - ) easy bruising Neurological: (  + ) numbness, ( + ) tingling, ( - ) new weaknesses Behavioral/Psych: ( - ) mood change, ( - ) new changes  All other systems were reviewed with the patient and are negative.  I have reviewed the past medical history, past surgical history, social history and family history with the patient and they are unchanged from previous note.  ALLERGIES:  has No Known Allergies.  MEDICATIONS:  Current Outpatient Medications  Medication Sig Dispense Refill  . amitriptyline (ELAVIL) 100 MG tablet Take 1 tablet (100 mg total) by mouth at bedtime. 60 tablet 5  . aspirin 81 MG chewable tablet Chew 81 mg by mouth daily.    Marland Kitchen atenolol (TENORMIN) 25 MG tablet Take 25 mg by mouth 2 (two) times daily.   2  . DEXILANT 60 MG capsule Take 1 capsule by mouth daily.  0  . hydrochlorothiazide (HYDRODIURIL) 25 MG tablet Take 25 mg by mouth daily.    . isosorbide mononitrate (IMDUR) 60 MG 24 hr tablet Take 60 mg by mouth daily.    Marland Kitchen oxyCODONE (OXYCONTIN) 60 MG 12 hr tablet Take 60 mg by mouth every 12 (twelve) hours. 60 each 0  . rosuvastatin (CRESTOR) 5 MG tablet Take 5 mg by mouth daily.    Marland Kitchen topiramate (TOPAMAX) 100 MG tablet Take 1 tablet (100 mg total) by mouth 2 (two) times daily. 60 tablet 11  . vitamin C (ASCORBIC ACID) 500 MG tablet Take 500 mg by mouth daily.    Marland Kitchen zolpidem (AMBIEN) 5 MG tablet Take 1 tablet (5 mg total) by mouth at bedtime as needed for sleep. 30 tablet 1   No current facility-administered medications for this visit.     PHYSICAL EXAMINATION: ECOG PERFORMANCE STATUS: 2 - Symptomatic, <50% confined to bed  Today's Vitals   04/12/18 1402  BP: 135/75  Pulse: 96  Resp: 18  Temp: 97.6 F (36.4 C)  TempSrc: Oral  SpO2: 97%  Weight: 172 lb 6.4 oz (78.2 kg)  Height: 5' 6"  (1.676 m)   Body mass index is 27.83 kg/m.  Filed Weights   04/12/18 1402  Weight: 172 lb 6.4 oz (78.2 kg)    GENERAL: alert, no distress, older than stated age  SKIN: extesnive raised brown skin plaques  withscattered erythematous lesions over the extremities; a raised skin lesion over the right anterior calf, unchanged EYES: conjunctiva are pink and non-injected, sclera clear OROPHARYNX: no exudate, no erythema; lips, buccal mucosa, and tongue normal  NECK: supple, non-tender LYMPH:  no palpable lymphadenopathy in the cervical or axillary  LUNGS: clear to auscultation and percussion with normal breathing effort HEART: regular rate & rhythm and no murmurs and no lower extremity edema ABDOMEN: soft, non-tender, non-distended, normal bowel sounds Musculoskeletal: no cyanosis of digits and no clubbing  PSYCH: alert & oriented x 3, flat speech  LABORATORY DATA:  I have reviewed the data as listed    Component Value Date/Time   NA 137 03/15/2018 1258   NA 139 02/12/2018 1625   K 3.1 (L) 03/15/2018 1258   CL 96 (L) 03/15/2018 1258   CO2 28 03/15/2018 1258   GLUCOSE 122 (H) 03/15/2018 1258   BUN 7 (L) 03/15/2018 1258   BUN 9 02/12/2018 1625  CREATININE 0.90 03/15/2018 1258   CALCIUM 10.0 03/15/2018 1258   PROT 7.1 03/15/2018 1258   PROT 7.0 02/12/2018 1625   ALBUMIN 3.7 03/15/2018 1258   ALBUMIN 4.6 02/12/2018 1625   AST 26 03/15/2018 1258   ALT 19 03/15/2018 1258   ALKPHOS 154 (H) 03/15/2018 1258   BILITOT 0.5 03/15/2018 1258   GFRNONAA >60 03/15/2018 1258   GFRAA >60 03/15/2018 1258    No results found for: SPEP, UPEP  Lab Results  Component Value Date   WBC 9.5 04/12/2018   NEUTROABS 4.6 04/12/2018   HGB 14.3 04/12/2018   HCT 44.2 04/12/2018   MCV 95.9 04/12/2018   PLT 387 04/12/2018      Chemistry      Component Value Date/Time   NA 137 03/15/2018 1258   NA 139 02/12/2018 1625   K 3.1 (L) 03/15/2018 1258   CL 96 (L) 03/15/2018 1258   CO2 28 03/15/2018 1258   BUN 7 (L) 03/15/2018 1258   BUN 9 02/12/2018 1625   CREATININE 0.90 03/15/2018 1258      Component Value Date/Time   CALCIUM 10.0 03/15/2018 1258   ALKPHOS 154 (H) 03/15/2018 1258   AST 26 03/15/2018  1258   ALT 19 03/15/2018 1258   BILITOT 0.5 03/15/2018 1258       RADIOGRAPHIC STUDIES: I have personally reviewed the radiological images as listed below and agreed with the findings in the report. Nm Pet Image Initial (pi) Whole Body  Result Date: 04/05/2018 CLINICAL DATA:  Initial treatment strategy for monoclonal gammopathy. EXAM: NUCLEAR MEDICINE PET WHOLE BODY TECHNIQUE: 8.4 mCi F-18 FDG was injected intravenously. Full-ring PET imaging was performed from the skull base to thigh after the radiotracer. CT data was obtained and used for attenuation correction and anatomic localization. Fasting blood glucose: 109 mg/dl COMPARISON:  None. FINDINGS: Mediastinal blood pool activity: SUV max 2.6 HEAD/NECK: Low-grade symmetric activity in the tonsillar region is thought to be physiologic. No significant abnormal hypermetabolic activity in this region. Incidental CT findings: none CHEST: No significant abnormal hypermetabolic activity in this region. Incidental CT findings: Aortic arch tortuosity and mild atherosclerotic calcification. Left anterior descending coronary artery atherosclerosis. Mild biapical pleuroparenchymal scarring. Mosaic attenuation in the lungs. ABDOMEN/PELVIS: No significant abnormal hypermetabolic activity in this region. Incidental CT findings: Bladder cystocele implies pelvic floor laxity. Borderline distended gallbladder. Fatty atrophy of the pancreas. Mild prominence of stool in the proximal 2/3 of the colon. SKELETON: Focal accentuated activity along the tip of the spinous process of T10 without a lytic lesion in this vicinity, but with questionable cortical irregularity. Suspected T11 compression fracture, not appreciated on the recent chest radiograph from 02/05/2018, possibly subacute. No lytic lesion characteristic for multiple myeloma is identified. Incidental CT findings: Postoperative findings in the lower lumbar spine. Asymmetric degenerative arthropathy and postoperative  findings in the left knee. EXTREMITIES: Small foci of accentuated metabolic activity along the cutaneous margins in the calves, for example anteriorly in the right and posterolaterally on the left side, associated with small skin lesions. These may well be small benign lesions but correlation with visual inspection is recommended a small exophytic lesion of the right lower calf anteriorly has a maximum SUV of 3.7 and a left posterior area of cutaneous thickening has maximum SUV of 4.4. Incidental CT findings: none IMPRESSION: 1. There is some focally accentuated activity along the tip of the spinous process of T10 but without a lytic lesion in this vicinity. There is some questionable cortical irregularity which  could be due to a small avulsion or fracture. The CT data does not further clarify this region. At T11 on the CT data there seems to potentially be a new mild compression fracture, not present on 02/05/2018, and this could represent an injury related to the compression. Consider dedicated MRI or CT of the lower thoracic spine to further characterize. I am skeptical that the compression at T11 has anything to do with myeloma, but MRI could further characterize. 2. No other significant bony lesions are observed. 3. Small cutaneous lesions along both calves have accentuated metabolic activity. While most likely inflammatory or benign, visual inspection of the calves for skin lesions is recommended just to be certain that there is not any lesion characteristic for melanoma or other significant cutaneous process. 4. Other imaging findings of potential clinical significance: Aortic Atherosclerosis (ICD10-I70.0). Coronary atherosclerosis. Mosaic attenuation in the lungs possibly from air trapping or extrinsic allergic alveolitis. Pelvic floor laxity with bladder cystocele. Borderline appearance for constipation. Asymmetric degenerative arthropathy of the left knee with postoperative findings noted. Postoperative  findings in the lower lumbar spine. Electronically Signed   By: Van Clines M.D.   On: 04/05/2018 08:34

## 2018-04-26 ENCOUNTER — Other Ambulatory Visit: Payer: Self-pay | Admitting: Orthopaedic Surgery

## 2018-04-26 DIAGNOSIS — M4326 Fusion of spine, lumbar region: Secondary | ICD-10-CM

## 2018-04-26 DIAGNOSIS — M546 Pain in thoracic spine: Secondary | ICD-10-CM

## 2018-05-16 ENCOUNTER — Ambulatory Visit
Admission: RE | Admit: 2018-05-16 | Discharge: 2018-05-16 | Disposition: A | Discharge status: Home / Self Care | Payer: Managed Care, Other (non HMO) | Source: Ambulatory Visit | Attending: Orthopaedic Surgery | Admitting: Orthopaedic Surgery

## 2018-05-16 DIAGNOSIS — M546 Pain in thoracic spine: Secondary | ICD-10-CM

## 2018-05-16 DIAGNOSIS — M4326 Fusion of spine, lumbar region: Secondary | ICD-10-CM

## 2018-05-17 ENCOUNTER — Other Ambulatory Visit: Payer: Self-pay | Admitting: Cardiology

## 2018-05-17 DIAGNOSIS — R079 Chest pain, unspecified: Secondary | ICD-10-CM

## 2018-05-27 ENCOUNTER — Encounter (HOSPITAL_COMMUNITY)
Admission: RE | Admit: 2018-05-27 | Discharge: 2018-05-27 | Disposition: A | Discharge status: Home / Self Care | Payer: Managed Care, Other (non HMO) | Source: Ambulatory Visit | Attending: Cardiology | Admitting: Cardiology

## 2018-05-27 DIAGNOSIS — R079 Chest pain, unspecified: Secondary | ICD-10-CM | POA: Diagnosis present

## 2018-05-27 MED ORDER — REGADENOSON 0.4 MG/5ML IV SOLN
0.4000 mg | Freq: Once | INTRAVENOUS | Status: AC
  Administered 2018-05-27: 0.4 mg via INTRAVENOUS

## 2018-05-27 MED ORDER — REGADENOSON 0.4 MG/5ML IV SOLN
INTRAVENOUS | Status: AC
  Filled 2018-05-27: qty 5

## 2018-05-27 MED ORDER — TECHNETIUM TC 99M TETROFOSMIN IV KIT
10.0000 | PACK | Freq: Once | INTRAVENOUS | Status: AC | PRN
  Administered 2018-05-27: 10 via INTRAVENOUS

## 2018-05-27 MED ORDER — TECHNETIUM TC 99M TETROFOSMIN IV KIT
30.0000 | PACK | Freq: Once | INTRAVENOUS | Status: AC | PRN
  Administered 2018-05-27: 30 via INTRAVENOUS

## 2018-06-11 ENCOUNTER — Other Ambulatory Visit: Payer: Self-pay | Admitting: Physician Assistant

## 2018-06-11 NOTE — Telephone Encounter (Signed)
Seen OCT 2019

## 2018-06-14 ENCOUNTER — Ambulatory Visit: Payer: Managed Care, Other (non HMO) | Admitting: Neurology

## 2018-06-17 ENCOUNTER — Encounter: Payer: Self-pay | Admitting: Physician Assistant

## 2018-06-17 ENCOUNTER — Ambulatory Visit: Payer: Managed Care, Other (non HMO) | Admitting: Physician Assistant

## 2018-06-17 DIAGNOSIS — M4727 Other spondylosis with radiculopathy, lumbosacral region: Secondary | ICD-10-CM

## 2018-06-17 DIAGNOSIS — K219 Gastro-esophageal reflux disease without esophagitis: Secondary | ICD-10-CM | POA: Diagnosis not present

## 2018-06-17 DIAGNOSIS — M5136 Other intervertebral disc degeneration, lumbar region: Secondary | ICD-10-CM | POA: Diagnosis not present

## 2018-06-17 MED ORDER — DEXILANT 60 MG PO CPDR: 1.0000 | DELAYED_RELEASE_CAPSULE | Freq: Every day | ORAL | 11 refills | Status: DC

## 2018-06-17 MED ORDER — OXYCODONE HCL ER 60 MG PO T12A: 60.0000 mg | EXTENDED_RELEASE_TABLET | Freq: Two times a day (BID) | ORAL | 0 refills | Status: DC

## 2018-06-17 MED ORDER — ZOLPIDEM TARTRATE 5 MG PO TABS: 5.0000 mg | ORAL_TABLET | Freq: Every evening | ORAL | 5 refills | Status: DC | PRN

## 2018-06-17 NOTE — Progress Notes (Signed)
BP 108/74   Pulse 90   Temp (!) 97.1 F (36.2 C) (Oral)   Ht 5\' 6"  (1.676 m)   Wt 163 lb 6.4 oz (74.1 kg)   BMI 26.37 kg/m    Subjective:    Patient ID: [name], female    DOB: 1956-02-26, 63 y.o.   MRN: [id number]  HPI: [name] is a 63 y.o. female presenting on 06/17/2018 for Pain (3 month follow up )  This patient comes in for periodic recheck.  She does have chronic GERD issues.  Ever since she stopped the Dexilant due to her insurance not paying for it she has had a severe GERD and weight loss.  The patient has tried and failed over-the-counter omeprazole (Prilosec), Nexium, Prevacid.  And all of the other basic in antacid such as Tums, Rolaids etc.  She has lost weight and is not able to control her GERD whatsoever.  She also has chronic back pain due to severe degeneration and nerve impingement.  She is also still seen her back specialist and orthopedics.  She is also seeing dermatology for several skin cancers.  She reports that she is doing well with those.  Past Medical History:  Diagnosis Date  . Anxiety   . Arthritis   . GERD (gastroesophageal reflux disease)   . Hyperlipidemia   . Hypertension    Relevant past medical, surgical, family and social history reviewed and updated as indicated. Interim medical history since our last visit reviewed. Allergies and medications reviewed and updated. DATA REVIEWED: CHART IN EPIC  Family History reviewed for pertinent findings.  Review of Systems  Constitutional: Positive for unexpected weight change. Negative for activity change, fatigue and fever.  HENT: Negative.   Eyes: Negative.   Respiratory: Negative.  Negative for cough.   Cardiovascular: Negative.  Negative for chest pain.  Gastrointestinal: Positive for abdominal distention and nausea. Negative for abdominal pain.  Endocrine: Positive for polyuria.  Genitourinary: Negative.  Negative for dysuria.  Musculoskeletal: Positive for arthralgias and back  pain.  Skin: Negative.   Neurological: Negative.     Allergies as of 06/17/2018   No Known Allergies     Medication List       Accurate as of June 17, 2018 11:59 PM. Always use your most recent med list.        amitriptyline 100 MG tablet Commonly known as:  ELAVIL Take 1 tablet (100 mg total) by mouth at bedtime.   aspirin 81 MG chewable tablet Chew 81 mg by mouth daily.   atenolol 25 MG tablet Commonly known as:  TENORMIN Take 25 mg by mouth 2 (two) times daily.   DEXILANT 60 MG capsule Generic drug:  dexlansoprazole Take 1 capsule (60 mg total) by mouth daily.   hydrochlorothiazide 25 MG tablet Commonly known as:  HYDRODIURIL Take 25 mg by mouth daily.   isosorbide mononitrate 60 MG 24 hr tablet Commonly known as:  IMDUR Take 60 mg by mouth daily.   oxyCODONE 60 MG 12 hr tablet Commonly known as:  OXYCONTIN Take 60 mg by mouth every 12 (twelve) hours.   oxyCODONE 60 MG 12 hr tablet Commonly known as:  OXYCONTIN Take 60 mg by mouth 2 (two) times daily.   oxyCODONE 60 MG 12 hr tablet Commonly known as:  OXYCONTIN Take 60 mg by mouth 2 (two) times daily.   rosuvastatin 5 MG tablet Commonly known as:  CRESTOR Take 5 mg by mouth daily.  topiramate 100 MG tablet Commonly known as:  TOPAMAX Take 1 tablet (100 mg total) by mouth 2 (two) times daily.   vitamin C 500 MG tablet Commonly known as:  ASCORBIC ACID Take 500 mg by mouth daily.   zolpidem 5 MG tablet Commonly known as:  AMBIEN Take 1 tablet (5 mg total) by mouth at bedtime as needed. for sleep          Objective:    BP 108/74   Pulse 90   Temp (!) 97.1 F (36.2 C) (Oral)   Ht 5\' 6"  (1.676 m)   Wt 163 lb 6.4 oz (74.1 kg)   BMI 26.37 kg/m   No Known Allergies  Wt Readings from Last 3 Encounters:  06/17/18 163 lb 6.4 oz (74.1 kg)  04/12/18 172 lb 6.4 oz (78.2 kg)  03/22/18 174 lb (78.9 kg)    Physical Exam Constitutional:      Appearance: She is well-developed.  HENT:      Head: Normocephalic and atraumatic.  Eyes:     Conjunctiva/sclera: Conjunctivae normal.     Pupils: Pupils are equal, round, and reactive to light.  Cardiovascular:     Rate and Rhythm: Normal rate and regular rhythm.     Heart sounds: Normal heart sounds.  Pulmonary:     Effort: Pulmonary effort is normal.     Breath sounds: Normal breath sounds.  Abdominal:     General: Bowel sounds are normal. There is distension.     Palpations: Abdomen is soft.     Tenderness: There is abdominal tenderness.  Skin:    General: Skin is warm and dry.     Findings: No rash.  Neurological:     Mental Status: She is alert and oriented to person, place, and time.     Deep Tendon Reflexes: Reflexes are normal and symmetric.  Psychiatric:        Behavior: Behavior normal.        Thought Content: Thought content normal.        Judgment: Judgment normal.     Results for orders placed or performed in visit on 04/12/18  CBC with Differential (Cancer Center Only)  Result Value Ref Range   WBC Count 9.5 4.0 - 10.5 K/uL   RBC 4.61 3.87 - 5.11 MIL/uL   Hemoglobin 14.3 12.0 - 15.0 g/dL   HCT 44.2 36.0 - 46.0 %   MCV 95.9 80.0 - 100.0 fL   MCH 31.0 26.0 - 34.0 pg   MCHC 32.4 30.0 - 36.0 g/dL   RDW 13.1 11.5 - 15.5 %   Platelet Count 387 150 - 400 K/uL   nRBC 0.0 0.0 - 0.2 %   Neutrophils Relative % 49 %   Neutro Abs 4.6 1.7 - 7.7 K/uL   Lymphocytes Relative 35 %   Lymphs Abs 3.3 0.7 - 4.0 K/uL   Monocytes Relative 10 %   Monocytes Absolute 0.9 0.1 - 1.0 K/uL   Eosinophils Relative 5 %   Eosinophils Absolute 0.5 0.0 - 0.5 K/uL   Basophils Relative 1 %   Basophils Absolute 0.1 0.0 - 0.1 K/uL   Immature Granulocytes 0 %   Abs Immature Granulocytes 0.03 0.00 - 0.07 K/uL  CMP (Cancer Center only)  Result Value Ref Range   Sodium 139 135 - 145 mmol/L   Potassium 3.4 (L) 3.5 - 5.1 mmol/L   Chloride 97 (L) 98 - 111 mmol/L   CO2 32 22 - 32 mmol/L   Glucose, Bld  121 (H) 70 - 99 mg/dL   BUN 8 8 - 23  mg/dL   Creatinine 1.10 (H) 0.44 - 1.00 mg/dL   Calcium 9.9 8.9 - 10.3 mg/dL   Total Protein 7.0 6.5 - 8.1 g/dL   Albumin 3.8 3.5 - 5.0 g/dL   AST 24 15 - 41 U/L   ALT 14 0 - 44 U/L   Alkaline Phosphatase 102 38 - 126 U/L   Total Bilirubin 0.5 0.3 - 1.2 mg/dL   GFR, Est Non Af Am 52 (L) >60 mL/min   GFR, Est AFR Am >60 >60 mL/min   Anion gap 10 5 - 15      Assessment & Plan:   1. Gastroesophageal reflux disease without esophagitis - DEXILANT 60 MG capsule; Take 1 capsule (60 mg total) by mouth daily.  Dispense: 30 capsule; Refill: 11  2. DDD (degenerative disc disease), lumbar - oxyCODONE (OXYCONTIN) 60 MG 12 hr tablet; Take 60 mg by mouth every 12 (twelve) hours.  Dispense: 60 each; Refill: 0  3. Osteoarthritis of spine with radiculopathy, lumbosacral region - oxyCODONE (OXYCONTIN) 60 MG 12 hr tablet; Take 60 mg by mouth every 12 (twelve) hours.  Dispense: 60 each; Refill: 0   Continue all other maintenance medications as listed above.  Follow up plan: Return in about 3 months (around 09/16/2018) for recheck.  Educational handout given for [place] PA-C [place] [address]  [place], [place] [address] [phone]   06/18/2018, 5:38 PM

## 2018-06-21 ENCOUNTER — Telehealth: Payer: Self-pay | Admitting: Physician Assistant

## 2018-06-21 ENCOUNTER — Other Ambulatory Visit: Payer: Self-pay | Admitting: Physician Assistant

## 2018-06-21 DIAGNOSIS — M5136 Other intervertebral disc degeneration, lumbar region: Secondary | ICD-10-CM

## 2018-06-21 DIAGNOSIS — M4727 Other spondylosis with radiculopathy, lumbosacral region: Secondary | ICD-10-CM

## 2018-06-21 MED ORDER — OXYCODONE HCL ER 60 MG PO T12A: 60.0000 mg | EXTENDED_RELEASE_TABLET | Freq: Two times a day (BID) | ORAL | 0 refills | Status: DC

## 2018-06-21 NOTE — Telephone Encounter (Signed)
Patient aware.

## 2018-06-25 ENCOUNTER — Other Ambulatory Visit: Payer: Self-pay | Admitting: *Deleted

## 2018-06-25 DIAGNOSIS — K219 Gastro-esophageal reflux disease without esophagitis: Secondary | ICD-10-CM

## 2018-06-25 MED ORDER — DEXILANT 60 MG PO CPDR: 1.0000 | DELAYED_RELEASE_CAPSULE | Freq: Every day | ORAL | 3 refills | Status: DC

## 2018-07-04 ENCOUNTER — Other Ambulatory Visit: Payer: Self-pay | Admitting: Physician Assistant

## 2018-07-04 ENCOUNTER — Telehealth: Payer: Self-pay

## 2018-07-04 DIAGNOSIS — I25118 Atherosclerotic heart disease of native coronary artery with other forms of angina pectoris: Secondary | ICD-10-CM

## 2018-07-04 DIAGNOSIS — K219 Gastro-esophageal reflux disease without esophagitis: Secondary | ICD-10-CM

## 2018-07-04 NOTE — Telephone Encounter (Signed)
Insurance denied prior auth for [employer] does not cover this medication no matter what the reason

## 2018-07-04 NOTE — Telephone Encounter (Signed)
I will set up a referral to gastroenterology then, nothing else has helped.

## 2018-07-08 ENCOUNTER — Ambulatory Visit
Admission: RE | Admit: 2018-07-08 | Discharge: 2018-07-08 | Disposition: A | Discharge status: Home / Self Care | Payer: Managed Care, Other (non HMO) | Source: Ambulatory Visit | Attending: Orthopedic Surgery | Admitting: Orthopedic Surgery

## 2018-07-08 ENCOUNTER — Other Ambulatory Visit: Payer: Self-pay | Admitting: Orthopedic Surgery

## 2018-07-08 DIAGNOSIS — S22080D Wedge compression fracture of T11-T12 vertebra, subsequent encounter for fracture with routine healing: Secondary | ICD-10-CM

## 2018-07-26 ENCOUNTER — Encounter: Payer: Self-pay | Admitting: Physician Assistant

## 2018-07-26 ENCOUNTER — Ambulatory Visit (INDEPENDENT_AMBULATORY_CARE_PROVIDER_SITE_OTHER): Payer: Managed Care, Other (non HMO) | Admitting: Physician Assistant

## 2018-07-26 VITALS — BP 111/80 | HR 105 | Temp 98.2°F | Ht 66.0 in | Wt 160.2 lb

## 2018-07-26 DIAGNOSIS — M898X9 Other specified disorders of bone, unspecified site: Secondary | ICD-10-CM | POA: Diagnosis not present

## 2018-07-26 DIAGNOSIS — M4727 Other spondylosis with radiculopathy, lumbosacral region: Secondary | ICD-10-CM

## 2018-07-26 DIAGNOSIS — L03116 Cellulitis of left lower limb: Secondary | ICD-10-CM

## 2018-07-26 DIAGNOSIS — G629 Polyneuropathy, unspecified: Secondary | ICD-10-CM

## 2018-07-26 DIAGNOSIS — M5136 Other intervertebral disc degeneration, lumbar region: Secondary | ICD-10-CM | POA: Diagnosis not present

## 2018-07-26 LAB — BAYER DCA HB A1C WAIVED: HB A1C (BAYER DCA - WAIVED): 6.3 % (ref <7.0)

## 2018-07-26 MED ORDER — CEPHALEXIN 500 MG PO CAPS: 500.0000 mg | ORAL_CAPSULE | Freq: Four times a day (QID) | ORAL | 1 refills | Status: DC

## 2018-07-27 ENCOUNTER — Other Ambulatory Visit: Payer: Self-pay | Admitting: Physician Assistant

## 2018-07-27 LAB — CMP14+EGFR
ALT: 20 IU/L (ref 0–32)
AST: 19 IU/L (ref 0–40)
Albumin/Globulin Ratio: 1.9 (ref 1.2–2.2)
Albumin: 4.4 g/dL (ref 3.8–4.8)
Alkaline Phosphatase: 113 IU/L (ref 39–117)
BUN / CREAT RATIO: 10 — AB (ref 12–28)
BUN: 10 mg/dL (ref 8–27)
Bilirubin Total: 0.4 mg/dL (ref 0.0–1.2)
CO2: 29 mmol/L (ref 20–29)
Calcium: 9.7 mg/dL (ref 8.7–10.3)
Chloride: 92 mmol/L — ABNORMAL LOW (ref 96–106)
Creatinine, Ser: 0.99 mg/dL (ref 0.57–1.00)
GFR calc non Af Amer: 61 mL/min/{1.73_m2} (ref >59)
GFR, EST AFRICAN AMERICAN: 70 mL/min/{1.73_m2} (ref >59)
Globulin, Total: 2.3 g/dL (ref 1.5–4.5)
Glucose: 134 mg/dL — ABNORMAL HIGH (ref 65–99)
Potassium: 3.1 mmol/L — ABNORMAL LOW (ref 3.5–5.2)
Sodium: 136 mmol/L (ref 134–144)
TOTAL PROTEIN: 6.7 g/dL (ref 6.0–8.5)

## 2018-07-27 LAB — CBC WITH DIFFERENTIAL/PLATELET
Basophils Absolute: 0.1 10*3/uL (ref 0.0–0.2)
Basos: 1 %
EOS (ABSOLUTE): 0.4 10*3/uL (ref 0.0–0.4)
Eos: 4 %
Hematocrit: 40.9 % (ref 34.0–46.6)
Hemoglobin: 14.1 g/dL (ref 11.1–15.9)
Immature Grans (Abs): 0 10*3/uL (ref 0.0–0.1)
Immature Granulocytes: 0 %
LYMPHS: 29 %
Lymphocytes Absolute: 2.7 10*3/uL (ref 0.7–3.1)
MCH: 31.8 pg (ref 26.6–33.0)
MCHC: 34.5 g/dL (ref 31.5–35.7)
MCV: 92 fL (ref 79–97)
Monocytes Absolute: 0.8 10*3/uL (ref 0.1–0.9)
Monocytes: 9 %
NEUTROS ABS: 5.2 10*3/uL (ref 1.4–7.0)
Neutrophils: 57 %
PLATELETS: 357 10*3/uL (ref 150–450)
RBC: 4.43 x10E6/uL (ref 3.77–5.28)
RDW: 13 % (ref 11.7–15.4)
WBC: 9.1 10*3/uL (ref 3.4–10.8)

## 2018-07-27 LAB — LIPID PANEL
CHOLESTEROL TOTAL: 196 mg/dL (ref 100–199)
Chol/HDL Ratio: 3.8 ratio (ref 0.0–4.4)
HDL: 52 mg/dL (ref >39)
LDL CALC: 101 mg/dL — AB (ref 0–99)
Triglycerides: 217 mg/dL — ABNORMAL HIGH (ref 0–149)
VLDL Cholesterol Cal: 43 mg/dL — ABNORMAL HIGH (ref 5–40)

## 2018-07-27 LAB — TSH: TSH: 4.31 u[IU]/mL (ref 0.450–4.500)

## 2018-07-27 MED ORDER — POTASSIUM CHLORIDE ER 10 MEQ PO TBCR: 10.0000 meq | EXTENDED_RELEASE_TABLET | Freq: Every day | ORAL | 1 refills | Status: DC

## 2018-07-28 DIAGNOSIS — M898X9 Other specified disorders of bone, unspecified site: Secondary | ICD-10-CM | POA: Insufficient documentation

## 2018-07-28 DIAGNOSIS — L03116 Cellulitis of left lower limb: Secondary | ICD-10-CM | POA: Insufficient documentation

## 2018-07-28 NOTE — Progress Notes (Signed)
BP 111/80   Pulse (!) 105   Temp 98.2 F (36.8 C) (Oral)   Ht _0  (1.676 m)   Wt 160 lb 3.2 oz (72.7 kg)   SpO2 99%   BMI 25.86 kg/m    Subjective:    Patient ID: [name], female    DOB: 1955-10-12, 63 y.o.   MRN: [id number]  HPI: [name] is a 63 y.o. female presenting on 07/26/2018 for Generalized Body Aches and Fatigue  She comes in with multiple complaints involving her severe DDD with chronic neuropathy. She is seeing dermatology at [facility] for severe cancers on her skin. She has MOHS surgery and some very deep treatment.  She has an area that is draining where the stitches were taken out.  Recommend that she let the surgeon know, but we will treat for infection.  She complains of fatigue, weakness and overall feeling bad. She needs labs to be performed.  She is having very bad pain in the back where she had kyphoplasty performed 2 weeks ago.  She has heard from the pharmacy that her oxycontin is going to need prior authorization again.  She has CAD and cannot take any NSAIDS.  Her spine is severely degenerated and now she is even having compression fractures.  She will always be needful of narcotic treatment.  This patient returns for a 6 month recheck on narcotic use for DDD and medication refills  Patient currently taking oxycontin. Behavior- normal Medication side effects- none Any concerns- no  PMP AWARE website reviewed: Yes Any suspicious activity on PMP Aware: No MME daily dose: 180 MME   Past Medical History:  Diagnosis Date  . Anxiety   . Arthritis   . GERD (gastroesophageal reflux disease)   . Hyperlipidemia   . Hypertension    Relevant past medical, surgical, family and social history reviewed and updated as indicated. Interim medical history since our last visit reviewed. Allergies and medications reviewed and updated. DATA REVIEWED: CHART IN EPIC  Family History reviewed for pertinent findings.  Review of Systems  Constitutional:  Positive for fatigue.  HENT: Negative.   Eyes: Negative.   Respiratory: Negative.   Gastrointestinal: Negative.   Genitourinary: Negative.   Musculoskeletal: Positive for arthralgias, back pain and joint swelling.  Skin: Positive for color change and wound.  Neurological: Positive for weakness.    Allergies as of 07/26/2018   No Known Allergies     Medication List       Accurate as of July 26, 2018 11:59 PM. Always use your most recent med list.        amitriptyline 100 MG tablet Commonly known as:  ELAVIL Take 1 tablet (100 mg total) by mouth at bedtime.   aspirin 81 MG chewable tablet Chew 81 mg by mouth daily.   atenolol 25 MG tablet Commonly known as:  TENORMIN Take 25 mg by mouth 2 (two) times daily.   cephALEXin 500 MG capsule Commonly known as:  KEFLEX Take 1 capsule (500 mg total) by mouth 4 (four) times daily.   hydrochlorothiazide 25 MG tablet Commonly known as:  HYDRODIURIL Take 25 mg by mouth daily.   isosorbide mononitrate 60 MG 24 hr tablet Commonly known as:  IMDUR Take 60 mg by mouth daily.   oxyCODONE 60 MG 12 hr tablet Commonly known as:  OXYCONTIN Take 60 mg by mouth every 12 (twelve) hours.   oxyCODONE 60 MG 12 hr tablet Commonly known as:  OXYCONTIN Take 60  mg by mouth 2 (two) times daily.   oxyCODONE 60 MG 12 hr tablet Commonly known as:  OXYCONTIN Take 60 mg by mouth 2 (two) times daily.   rosuvastatin 5 MG tablet Commonly known as:  CRESTOR Take 5 mg by mouth daily.   topiramate 100 MG tablet Commonly known as:  TOPAMAX Take 1 tablet (100 mg total) by mouth 2 (two) times daily.   vitamin C 500 MG tablet Commonly known as:  ASCORBIC ACID Take 500 mg by mouth daily.   zolpidem 5 MG tablet Commonly known as:  AMBIEN Take 1 tablet (5 mg total) by mouth at bedtime as needed. for sleep          Objective:    BP 111/80   Pulse (!) 105   Temp 98.2 F (36.8 C) (Oral)   Ht _0  (1.676 m)   Wt 160 lb 3.2 oz (72.7 kg)    SpO2 99%   BMI 25.86 kg/m   No Known Allergies  Wt Readings from Last 3 Encounters:  07/26/18 160 lb 3.2 oz (72.7 kg)  06/17/18 163 lb 6.4 oz (74.1 kg)  04/12/18 172 lb 6.4 oz (78.2 kg)    Physical Exam Constitutional:      Appearance: She is well-developed.  HENT:     Head: Normocephalic and atraumatic.  Eyes:     Conjunctiva/sclera: Conjunctivae normal.     Pupils: Pupils are equal, round, and reactive to light.  Cardiovascular:     Rate and Rhythm: Normal rate and regular rhythm.     Heart sounds: Normal heart sounds.  Pulmonary:     Effort: Pulmonary effort is normal.     Breath sounds: Normal breath sounds.  Abdominal:     General: Bowel sounds are normal.     Palpations: Abdomen is soft.  Skin:    General: Skin is warm and dry.     Findings: Abscess, erythema, lesion and wound present. No rash.       Neurological:     Mental Status: She is alert and oriented to person, place, and time.     Deep Tendon Reflexes: Reflexes are normal and symmetric.  Psychiatric:        Behavior: Behavior normal.        Thought Content: Thought content normal.        Judgment: Judgment normal.     Results for orders placed or performed in visit on 07/26/18  CBC with Differential/Platelet  Result Value Ref Range   WBC 9.1 3.4 - 10.8 x10E3/uL   RBC 4.43 3.77 - 5.28 x10E6/uL   Hemoglobin 14.1 11.1 - 15.9 g/dL   Hematocrit 40.9 34.0 - 46.6 %   MCV 92 79 - 97 fL   MCH 31.8 26.6 - 33.0 pg   MCHC 34.5 31.5 - 35.7 g/dL   RDW 13.0 11.7 - 15.4 %   Platelets 357 150 - 450 x10E3/uL   Neutrophils 57 Not Estab. %   Lymphs 29 Not Estab. %   Monocytes 9 Not Estab. %   Eos 4 Not Estab. %   Basos 1 Not Estab. %   Neutrophils Absolute 5.2 1.4 - 7.0 x10E3/uL   Lymphocytes Absolute 2.7 0.7 - 3.1 x10E3/uL   Monocytes Absolute 0.8 0.1 - 0.9 x10E3/uL   EOS (ABSOLUTE) 0.4 0.0 - 0.4 x10E3/uL   Basophils Absolute 0.1 0.0 - 0.2 x10E3/uL   Immature Granulocytes 0 Not Estab. %   Immature Grans  (Abs) 0.0 0.0 - 0.1 x10E3/uL  CMP14+EGFR  Result Value Ref Range   Glucose 134 (H) 65 - 99 mg/dL   BUN 10 8 - 27 mg/dL   Creatinine, Ser 0.99 0.57 - 1.00 mg/dL   GFR calc non Af Amer 61 >59 mL/min/1.73   GFR calc Af Amer 70 >59 mL/min/1.73   BUN/Creatinine Ratio 10 (L) 12 - 28   Sodium 136 134 - 144 mmol/L   Potassium 3.1 (L) 3.5 - 5.2 mmol/L   Chloride 92 (L) 96 - 106 mmol/L   CO2 29 20 - 29 mmol/L   Calcium 9.7 8.7 - 10.3 mg/dL   Total Protein 6.7 6.0 - 8.5 g/dL   Albumin 4.4 3.8 - 4.8 g/dL   Globulin, Total 2.3 1.5 - 4.5 g/dL   Albumin/Globulin Ratio 1.9 1.2 - 2.2   Bilirubin Total 0.4 0.0 - 1.2 mg/dL   Alkaline Phosphatase 113 39 - 117 IU/L   AST 19 0 - 40 IU/L   ALT 20 0 - 32 IU/L  Lipid panel  Result Value Ref Range   Cholesterol, Total 196 100 - 199 mg/dL   Triglycerides 217 (H) 0 - 149 mg/dL   HDL 52 >39 mg/dL   VLDL Cholesterol Cal 43 (H) 5 - 40 mg/dL   LDL Calculated 101 (H) 0 - 99 mg/dL   Chol/HDL Ratio 3.8 0.0 - 4.4 ratio  TSH  Result Value Ref Range   TSH 4.310 0.450 - 4.500 uIU/mL  Bayer DCA Hb A1c Waived  Result Value Ref Range   HB A1C (BAYER DCA - WAIVED) 6.3 <7.0 %      Assessment & Plan:   1. Bone pain - CBC with Differential/Platelet - CMP14+EGFR - Lipid panel - TSH - Bayer DCA Hb A1c Waived - Microalbumin / creatinine urine ratio  2. Neuropathy - CBC with Differential/Platelet - CMP14+EGFR - Lipid panel - TSH - Bayer DCA Hb A1c Waived - Microalbumin / creatinine urine ratio - ToxASSURE Select 13 (MW), Urine  3. Cellulitis of left lower extremity - cephALEXin (KEFLEX) 500 MG capsule; Take 1 capsule (500 mg total) by mouth 4 (four) times daily.  Dispense: 40 capsule; Refill: 1  4. DDD (degenerative disc disease), lumbar - ToxASSURE Select 13 (MW), Urine  5. Osteoarthritis of spine with radiculopathy, lumbosacral region - ToxASSURE Select 13 (MW), Urine   Continue all other maintenance medications as listed above.  Follow up  plan: No follow-ups on file.  Educational handout given for [place] PA-C [facility] [address]  [place], [place] [address] [phone]   07/28/2018, 8:58 PM

## 2018-07-29 ENCOUNTER — Other Ambulatory Visit: Payer: Self-pay

## 2018-07-29 DIAGNOSIS — E876 Hypokalemia: Secondary | ICD-10-CM

## 2018-08-01 ENCOUNTER — Other Ambulatory Visit: Payer: Managed Care, Other (non HMO)

## 2018-08-01 ENCOUNTER — Telehealth: Payer: Self-pay

## 2018-08-02 LAB — MICROALBUMIN / CREATININE URINE RATIO
Creatinine, Urine: 42.8 mg/dL
Microalbumin, Urine: <3 ug/mL

## 2018-08-02 NOTE — Telephone Encounter (Signed)
x

## 2018-08-06 LAB — TOXASSURE SELECT 13 (MW), URINE

## 2018-08-13 ENCOUNTER — Encounter: Payer: Self-pay | Admitting: Physician Assistant

## 2018-09-16 ENCOUNTER — Ambulatory Visit (INDEPENDENT_AMBULATORY_CARE_PROVIDER_SITE_OTHER): Payer: Managed Care, Other (non HMO) | Admitting: Physician Assistant

## 2018-09-16 ENCOUNTER — Other Ambulatory Visit: Payer: Self-pay

## 2018-09-16 DIAGNOSIS — M4727 Other spondylosis with radiculopathy, lumbosacral region: Secondary | ICD-10-CM | POA: Diagnosis not present

## 2018-09-16 DIAGNOSIS — E538 Deficiency of other specified B group vitamins: Secondary | ICD-10-CM

## 2018-09-16 DIAGNOSIS — M5136 Other intervertebral disc degeneration, lumbar region: Secondary | ICD-10-CM

## 2018-09-16 DIAGNOSIS — E559 Vitamin D deficiency, unspecified: Secondary | ICD-10-CM

## 2018-09-16 DIAGNOSIS — I10 Essential (primary) hypertension: Secondary | ICD-10-CM

## 2018-09-16 MED ORDER — OXYCODONE HCL ER 40 MG PO T12A: 40.0000 mg | EXTENDED_RELEASE_TABLET | Freq: Two times a day (BID) | ORAL | 0 refills | Status: DC

## 2018-09-16 MED ORDER — GABAPENTIN 100 MG PO CAPS: 100.0000 mg | ORAL_CAPSULE | Freq: Three times a day (TID) | ORAL | 3 refills | Status: DC

## 2018-09-16 NOTE — Progress Notes (Signed)
Left sided decompression at L2-3 and L3-4. Wide patency of the canal and no evidence of significant foraminal compromise. Retrolisthesis of 2 mm at each level.  2. At L4-5, there are biforaminal annular rents and minimal facet hypertrophy. It is conceivable that the L4 nerve roots could be irritated as they exit but it does not appear that they are grossly compressed.   As far back in 2000 Dr. Joya Salm has been taking care of her degenerative disc disease throughout her spine.

## 2018-09-17 ENCOUNTER — Other Ambulatory Visit: Payer: Self-pay | Admitting: Physician Assistant

## 2018-09-17 ENCOUNTER — Encounter: Payer: Self-pay | Admitting: Physician Assistant

## 2018-09-17 MED ORDER — NALOXONE HCL 4 MG/0.1ML NA LIQD: 1.0000 | Freq: Once | NASAL | 1 refills | Status: AC

## 2018-09-17 MED ORDER — CALCITONIN (SALMON) 200 UNIT/ACT NA SOLN: 1.0000 | Freq: Every day | NASAL | 12 refills | Status: DC

## 2018-09-17 NOTE — Progress Notes (Signed)
Telephone visit  Subjective: CC: Chronic pain visit, patient is undergoing extensive treatment for skin lesions with advanced cancers with suspected melanoma. Gave her phone visit for safety  PCP: Terald Sleeper, PA-C [id number]:[id number] [name] is a 63 y.o. female calls for telephone consult today. Patient provides verbal consent for consult held via phone.  Patient is identified with 2 separate identifiers.  At this time the entire area is on COVID-19 social distancing and stay home orders are in place.  Patient is of higher risk and therefore we are performing this by a virtual method.  Location of patient: Home Location of provider: [facility] Others present for call: No  This is a 13-month recheck for the patient's chronic pain management control.  PAIN ASSESSMENT: Cause of pain-degenerative disc disease at all levels, osteoarthritis throughout spine and other joints.  Stable T11 superior endplate compression fracture since December with stable or decreased trace marrow edema. Mild retropulsion of bone contributing to mild right T10 foraminal stenosis, stable. 2. Edematous bilateral T10-T11 facets, and trace edema in the T10 spinous process probably related to the altered biomechanics at that level.Previous left hemilaminectomy at L2-3, L3-4 and L4-5. Wide patency of the central canal. At L4-5, there is mild bilateral foraminal narrowing right more than Left sided decompression at L2-3 and L3-4. Wide patency of the canal and no evidence of significant foraminal compromise. Retrolisthesis of 2 mm at each level.  At L4-5, there are biforaminal annular rents and minimal facet hypertrophy. It is conceivable that the L4 nerve roots could be irritated as they exit but it does not appear that they are grossly compressed.   In the past the patient had tried gabapentin when she was seen by Dr. Joya Salm.  She was also seen by Dr. Patrice Paradise in the past also.  She states that it did not help.  But is willing  to try it again.As far back in 2000 Dr. Joya Salm has been taking care of her degenerative disc disease throughout her spine.   Current medications-amitriptyline 100 mg at bedtime OxyContin 60  mg twice daily  Muscle relaxants make her too sleepy, cannot take NSAIDs due to coronary artery disease that is advanced. Medication side effects-no Any concerns-we have discussed that we need to lower her total quantity of medication.  We are going to work on the OxyContin going from 60 mg to 40 mg twice daily for the next 3 months.  You can add the gabapentin and try to try to titrated up as she tolerates.  Pain on scale of 1-10- 8-9 Frequency-Daily What increases pain-standing and walking What makes pain Better-rest Effects on ADL -moderate to severe Any change in general medical condition-getting worked up for abnormal skin lesions and monoclonal abnormality  Effectiveness of current meds-good Adverse reactions form pain meds-no PMP AWARE website reviewed: Yes Any suspicious activity on PMP Aware: No MME daily dose: 180 Contract on file Last UDS 08/01/2018, good results  History of overdose or risk of abuse no      New MME 120 for next 2 months while the gabapentin is titrated up as quickly as possible. ROS: Per HPI  No Known Allergies Past Medical History:  Diagnosis Date  . Anxiety   . Arthritis   . GERD (gastroesophageal reflux disease)   . Hyperlipidemia   . Hypertension     Current Outpatient Medications:  .  amitriptyline (ELAVIL) 100 MG tablet, Take 1 tablet (100 mg total) by mouth at bedtime., Disp: 60 tablet,  Rfl: 5 .  aspirin 81 MG chewable tablet, Chew 81 mg by mouth daily., Disp: , Rfl:  .  atenolol (TENORMIN) 25 MG tablet, Take 25 mg by mouth 2 (two) times daily. , Disp: , Rfl: 2 .  gabapentin (NEURONTIN) 100 MG capsule, Take 1-2 capsules (100-200 mg total) by mouth 3 (three) times daily., Disp: 180 capsule, Rfl: 3 .  hydrochlorothiazide (HYDRODIURIL) 25 MG tablet,  Take 25 mg by mouth daily., Disp: , Rfl:  .  isosorbide mononitrate (IMDUR) 60 MG 24 hr tablet, Take 60 mg by mouth daily., Disp: , Rfl:  .  oxyCODONE (OXYCONTIN) 40 mg 12 hr tablet, Take 1 tablet (40 mg total) by mouth every 12 (twelve) hours., Disp: 60 tablet, Rfl: 0 .  oxyCODONE (OXYCONTIN) 40 mg 12 hr tablet, Take 1 tablet (40 mg total) by mouth every 12 (twelve) hours., Disp: 60 tablet, Rfl: 0 .  potassium chloride (K-DUR) 10 MEQ tablet, Take 1 tablet (10 mEq total) by mouth daily., Disp: 30 tablet, Rfl: 1 .  rosuvastatin (CRESTOR) 5 MG tablet, Take 5 mg by mouth daily., Disp: , Rfl:  .  topiramate (TOPAMAX) 100 MG tablet, Take 1 tablet (100 mg total) by mouth 2 (two) times daily., Disp: 60 tablet, Rfl: 11 .  vitamin C (ASCORBIC ACID) 500 MG tablet, Take 500 mg by mouth daily., Disp: , Rfl:  .  zolpidem (AMBIEN) 5 MG tablet, Take 1 tablet (5 mg total) by mouth at bedtime as needed. for sleep, Disp: 30 tablet, Rfl: 5  Assessment/ Plan: 63 y.o. female   1. DDD (degenerative disc disease), lumbar - oxyCODONE (OXYCONTIN) 40 mg 12 hr tablet; Take 1 tablet (40 mg total) by mouth every 12 (twelve) hours.  Dispense: 60 tablet; Refill: 0 - gabapentin (NEURONTIN) 100 MG capsule; Take 1-2 capsules (100-200 mg total) by mouth 3 (three) times daily.  Dispense: 180 capsule; Refill: 3 - oxyCODONE (OXYCONTIN) 40 mg 12 hr tablet; Take 1 tablet (40 mg total) by mouth every 12 (twelve) hours.  Dispense: 60 tablet; Refill: 0  2. Osteoarthritis of spine with radiculopathy, lumbosacral region - oxyCODONE (OXYCONTIN) 40 mg 12 hr tablet; Take 1 tablet (40 mg total) by mouth every 12 (twelve) hours.  Dispense: 60 tablet; Refill: 0 - gabapentin (NEURONTIN) 100 MG capsule; Take 1-2 capsules (100-200 mg total) by mouth 3 (three) times daily.  Dispense: 180 capsule; Refill: 3 - oxyCODONE (OXYCONTIN) 40 mg 12 hr tablet; Take 1 tablet (40 mg total) by mouth every 12 (twelve) hours.  Dispense: 60 tablet; Refill: 0    Start time: 2:25 PM End time: 2:41 PM  Meds ordered this encounter  Medications  . oxyCODONE (OXYCONTIN) 40 mg 12 hr tablet    Sig: Take 1 tablet (40 mg total) by mouth every 12 (twelve) hours.    Dispense:  60 tablet    Refill:  0    Order Specific Question:   Supervising Provider    Answer:   Janora Norlander [[id number]]  . gabapentin (NEURONTIN) 100 MG capsule    Sig: Take 1-2 capsules (100-200 mg total) by mouth 3 (three) times daily.    Dispense:  180 capsule    Refill:  3    Order Specific Question:   Supervising Provider    Answer:   Janora Norlander [[id number]]  . oxyCODONE (OXYCONTIN) 40 mg 12 hr tablet    Sig: Take 1 tablet (40 mg total) by mouth every 12 (twelve) hours.    Dispense:  60  tablet    Refill:  0    Fill 30 days from original script date    Order Specific Question:   Supervising Provider    Answer:   Janora Norlander [[id number]]    Particia Nearing PA-C [place] [phone]

## 2018-09-18 ENCOUNTER — Other Ambulatory Visit: Payer: Self-pay | Admitting: Physician Assistant

## 2018-09-18 ENCOUNTER — Telehealth: Payer: Self-pay | Admitting: Physician Assistant

## 2018-09-18 DIAGNOSIS — M4727 Other spondylosis with radiculopathy, lumbosacral region: Secondary | ICD-10-CM

## 2018-09-18 DIAGNOSIS — M5136 Other intervertebral disc degeneration, lumbar region: Secondary | ICD-10-CM

## 2018-09-18 MED ORDER — OXYCODONE HCL ER 60 MG PO T12A: 40.0000 mg | EXTENDED_RELEASE_TABLET | Freq: Two times a day (BID) | ORAL | 0 refills | Status: DC

## 2018-09-18 NOTE — Telephone Encounter (Signed)
Yes that was going to be my recommendation to do.  Also I will go ahead and send in the OxyContin 60 mg twice daily to the pharmacy and a prior authorization will be obtained.  And we will know this in the future if we ever change her dosing again to give her a month supply and get the prior authorization done that month.

## 2018-09-18 NOTE — Telephone Encounter (Signed)
Oxycontin got sent to the wrong pharmacy [facility] Please advise.

## 2018-09-18 NOTE — Telephone Encounter (Signed)
Patient states that she needs a prior auth for the oxycodone 40.  Wanting to know if she can send her a one month supply of the 60 to [facility] [place]? Please advise

## 2018-09-18 NOTE — Telephone Encounter (Signed)
Called [facility] cancelled prescription.

## 2018-09-18 NOTE — Telephone Encounter (Signed)
Please call and cancel the prescription at [facility], a new prescription has been sent into [place]

## 2018-09-20 ENCOUNTER — Telehealth: Payer: Self-pay

## 2018-09-20 NOTE — Telephone Encounter (Signed)
Patient's insurance has issued a denial for her Oxycodone ER 40 mg tablet.

## 2018-09-20 NOTE — Telephone Encounter (Signed)
Why?  She had 60 approved. We are trying to taper her down.

## 2018-09-23 NOTE — Telephone Encounter (Signed)
Their reasons were there is no indication that either:  1.  The prescriber of this therapy is a board certified pain management specialist.  Or  2.  This therapy is being prescribed in coordination with a board certified pain management specialist

## 2018-09-23 NOTE — Progress Notes (Signed)
Agree with reduction.  Would continue taper off medication as able.  Agree with titration of gabapentin.    [name] M. Lajuana Ripple, [facility]

## 2018-09-24 ENCOUNTER — Encounter: Payer: Self-pay | Admitting: Physician Assistant

## 2018-09-24 NOTE — Telephone Encounter (Signed)
Yes, it is:  [place]  [address]

## 2018-09-24 NOTE — Telephone Encounter (Signed)
I have constructed a letter and obviously it is in epic.  I will also bring up the paper copy with my signature in late on your desk.

## 2018-09-24 NOTE — Telephone Encounter (Signed)
Is there an address or point of contact to specifically put on there?

## 2018-09-24 NOTE — Telephone Encounter (Signed)
Is there an appeal process for her? She is high risk to go out into other offices at this time.  She is being worked up for possible multiple myeloma.  And we are diligently working on reducing her medications.  If this information can sway them to approve the medication that would be very good.  If I need to write a letter please let me know.  Or speak with him on the phone?

## 2018-09-24 NOTE — Telephone Encounter (Signed)
If you could write a letter to [employer] we could fax it to them and see if it would help.

## 2018-09-25 NOTE — Telephone Encounter (Signed)
FYI, the appeal was sent to patient's insurance company this morning.

## 2018-10-18 ENCOUNTER — Other Ambulatory Visit: Payer: Self-pay | Admitting: Family Medicine

## 2018-10-18 ENCOUNTER — Telehealth: Payer: Self-pay | Admitting: Physician Assistant

## 2018-10-18 DIAGNOSIS — M5136 Other intervertebral disc degeneration, lumbar region: Secondary | ICD-10-CM

## 2018-10-18 DIAGNOSIS — M4727 Other spondylosis with radiculopathy, lumbosacral region: Secondary | ICD-10-CM

## 2018-10-18 MED ORDER — OXYCODONE HCL ER 60 MG PO T12A: 60.0000 mg | EXTENDED_RELEASE_TABLET | Freq: Two times a day (BID) | ORAL | 0 refills | Status: DC

## 2018-10-18 NOTE — Telephone Encounter (Signed)
[facility] [place], I though I had switched it before I routed the call last time. I switched the pharmacy this time.

## 2018-10-18 NOTE — Telephone Encounter (Signed)
Cc PCP 

## 2018-10-18 NOTE — Telephone Encounter (Signed)
[facility].  Please call [facility] and have other cancelled.

## 2018-10-18 NOTE — Progress Notes (Signed)
Bridge supply of Oxy 60 given until PA for 40mg  step down dose completed. The Narcotic Database has been reviewed.  There were no red flags.

## 2018-10-18 NOTE — Telephone Encounter (Signed)
1 week bridge supply sent of 60mg  while PA is pending.  Further management per PCP.

## 2018-10-18 NOTE — Telephone Encounter (Signed)
Spoke to [employer] pharmacist at [place] and she states the new dose of oxycodone (OXYCONTIN) 40mg  doesn't come in generic form, only comes in [place] brand and that the pt needs PA for that. They are sending the PA over by fax and pt is aware. Will forward to [name] to help with PA for pt.   Pt called back stating her insurance will still cover the oxycodone 60mg  if you could send in enough to get her through till [facility] gets back in the office 10/22/18. Pt only has 1 pain pill left. Pt aware of office policy with narcotics but insisted on me sending it to another provider.

## 2018-10-18 NOTE — Telephone Encounter (Signed)
Pt aware [facility] and rx at [place] cancelled.

## 2018-10-18 NOTE — Telephone Encounter (Signed)
Pt aware.

## 2018-10-21 NOTE — Telephone Encounter (Signed)
FYI, patient's prior authorization was approved today and faxed to pharmacy.

## 2018-10-21 NOTE — Telephone Encounter (Signed)
Do I need to send the 40mg  dose now? Or are there scripts on file?

## 2018-10-22 ENCOUNTER — Other Ambulatory Visit: Payer: Self-pay | Admitting: Physician Assistant

## 2018-10-22 DIAGNOSIS — M4727 Other spondylosis with radiculopathy, lumbosacral region: Secondary | ICD-10-CM

## 2018-10-22 DIAGNOSIS — M5136 Other intervertebral disc degeneration, lumbar region: Secondary | ICD-10-CM

## 2018-10-22 MED ORDER — OXYCODONE HCL ER 40 MG PO T12A: 40.0000 mg | EXTENDED_RELEASE_TABLET | Freq: Two times a day (BID) | ORAL | 0 refills | Status: DC

## 2018-10-22 NOTE — Telephone Encounter (Signed)
I have sent one prescription for OxyContin 40 mg twice daily to [facility] in [place].  She has appointment on June 15.  I will see her at that visit.

## 2018-10-22 NOTE — Telephone Encounter (Signed)
Patient aware and verbalizes understanding. 

## 2018-10-22 NOTE — Telephone Encounter (Signed)
You did one prescription on 09/16/18 that the pharmacy should be doing now since we got the prior authorization.  There are no other refills.

## 2018-11-18 ENCOUNTER — Ambulatory Visit: Payer: Managed Care, Other (non HMO) | Admitting: Physician Assistant

## 2018-11-20 ENCOUNTER — Other Ambulatory Visit: Payer: Self-pay

## 2018-11-20 ENCOUNTER — Encounter: Payer: Self-pay | Admitting: Physician Assistant

## 2018-11-20 ENCOUNTER — Ambulatory Visit (INDEPENDENT_AMBULATORY_CARE_PROVIDER_SITE_OTHER): Payer: Managed Care, Other (non HMO) | Admitting: Physician Assistant

## 2018-11-20 VITALS — BP 101/67 | HR 89 | Temp 98.1°F | Ht 66.0 in | Wt 150.8 lb

## 2018-11-20 DIAGNOSIS — S22000A Wedge compression fracture of unspecified thoracic vertebra, initial encounter for closed fracture: Secondary | ICD-10-CM

## 2018-11-20 DIAGNOSIS — R300 Dysuria: Secondary | ICD-10-CM

## 2018-11-20 DIAGNOSIS — M5136 Other intervertebral disc degeneration, lumbar region: Secondary | ICD-10-CM | POA: Diagnosis not present

## 2018-11-20 DIAGNOSIS — M4727 Other spondylosis with radiculopathy, lumbosacral region: Secondary | ICD-10-CM

## 2018-11-20 DIAGNOSIS — N3000 Acute cystitis without hematuria: Secondary | ICD-10-CM

## 2018-11-20 DIAGNOSIS — M0609 Rheumatoid arthritis without rheumatoid factor, multiple sites: Secondary | ICD-10-CM

## 2018-11-20 DIAGNOSIS — M7071 Other bursitis of hip, right hip: Secondary | ICD-10-CM

## 2018-11-20 MED ORDER — OXYCODONE HCL ER 40 MG PO T12A: 40.0000 mg | EXTENDED_RELEASE_TABLET | Freq: Two times a day (BID) | ORAL | 0 refills | Status: DC

## 2018-11-20 MED ORDER — SULFAMETHOXAZOLE-TRIMETHOPRIM 800-160 MG PO TABS: 1.0000 | ORAL_TABLET | Freq: Two times a day (BID) | ORAL | 0 refills | Status: DC

## 2018-11-20 MED ORDER — OXYCODONE HCL ER 30 MG PO T12A: 30.0000 mg | EXTENDED_RELEASE_TABLET | Freq: Two times a day (BID) | ORAL | 0 refills | Status: DC

## 2018-11-24 ENCOUNTER — Encounter: Payer: Self-pay | Admitting: Physician Assistant

## 2018-11-24 NOTE — Progress Notes (Signed)
BP 101/67   Pulse 89   Temp 98.1 F (36.7 C) (Oral)   Ht 5' 6"  (1.676 m)   Wt 150 lb 12.8 oz (68.4 kg)   BMI 24.34 kg/m    Subjective:    Patient ID: [name], female    DOB: 1956/05/10, 63 y.o.   MRN: [id number]  HPI: [name] is a 63 y.o. female presenting on 11/20/2018 for Medical Management of Chronic Issues (2 month follow up ), Pain, Hypertension, and Dysuria  This patient has had several days of dysuria, frequency and nocturia. There is also pain over the bladder in the suprapubic region, no back pain. Denies leakage or hematuria.  Denies fever or chills. No pain in flank area.   PAIN ASSESSMENT: Cause of pain-degenerative disc disease at all levels, osteoarthritis throughout spine and other joints.  Stable T11 superior endplate compression fracture since December with stable or decreased trace marrow edema. Mild retropulsion of bone contributing to mild right T10 foraminal stenosis, stable. 2. Edematous bilateral T10-T11 facets, and trace edema in the T10 spinous process probably related to the altered biomechanics at that level.Previous left hemilaminectomy at L2-3, L3-4 and L4-5. Wide patency of the central canal. At L4-5, there is mild bilateral foraminal narrowing right more than Left sided decompression at L2-3 and L3-4. Wide patency of the canal and no evidence of significant foraminal compromise. Retrolisthesis of 2 mm at each level. At L4-5, there are biforaminal annular rents and minimal facet hypertrophy. It is conceivable that the L4 nerve roots could be irritated as they exit but it does not appear that they are grossly compressed.   In the past the patient had tried gabapentin when she was seen by Dr. Joya Salm.  She was also seen by Dr. Patrice Paradise in the past also.  She states that it did not help.  But is willing to try it again.As far back in 2000 Dr. Joya Salm has been taking care of her degenerative disc disease throughout her spine.  She did start gabapentin  170m at bedtime.  She states that she has tolerated it okay.  Our plan is for her to go up to 200 mg over the next couple of months.  Current medications- amitriptyline 100 mg at bedtime OxyContin 40 mg twice daily, next dosing reduction will be 30 mg BID Gabapentin 100 mg at bedtime  Muscle relaxants make her too sleepy, cannot take NSAIDs due to coronary artery disease that is advanced. Medication side effects-no  Pain on scale of 1-10- 8-9 Frequency-Daily What increases pain-standing and walking What makes pain Better-rest Effects on ADL -moderate to severe Any change in general medical condition-getting worked up for abnormal skin lesions and monoclonal abnormality  Effectiveness of current meds-good Adverse reactions form pain meds-no PMP AWARE website reviewed: Yes Any suspicious activity on PMP Aware: No  MME daily dose: 120, came down from 180, plan to lower to 90.  Prior authorization will be needed again.  Contract on file Last UDS 08/01/2018, good results  History of overdose or risk of abuse no   Past Medical History:  Diagnosis Date  . Anxiety   . Arthritis   . GERD (gastroesophageal reflux disease)   . Hyperlipidemia   . Hypertension    Relevant past medical, surgical, family and social history reviewed and updated as indicated. Interim medical history since our last visit reviewed. Allergies and medications reviewed and updated. DATA REVIEWED: CHART IN EPIC  Family History reviewed for pertinent findings.  Review  of Systems  Constitutional: Negative.   HENT: Negative.   Eyes: Negative.   Respiratory: Negative.   Gastrointestinal: Negative.   Genitourinary: Positive for dysuria and frequency.  Musculoskeletal: Positive for arthralgias, back pain, gait problem, joint swelling, myalgias, neck pain and neck stiffness.  Neurological: Positive for weakness.    Allergies as of 11/20/2018   No Known Allergies     Medication List       Accurate  as of November 20, 2018 11:59 PM. If you have any questions, ask your nurse or doctor.        amitriptyline 100 MG tablet Commonly known as: ELAVIL Take 1 tablet (100 mg total) by mouth at bedtime.   aspirin 81 MG chewable tablet Chew 81 mg by mouth daily.   atenolol 25 MG tablet Commonly known as: TENORMIN Take 25 mg by mouth 2 (two) times daily.   calcitonin (salmon) 200 UNIT/ACT nasal spray Commonly known as: MIACALCIN/FORTICAL Place 1 spray into alternate nostrils daily. TAKE for BONE PAIN   gabapentin 100 MG capsule Commonly known as: NEURONTIN Take 1-2 capsules (100-200 mg total) by mouth 3 (three) times daily.   hydrochlorothiazide 25 MG tablet Commonly known as: HYDRODIURIL Take 25 mg by mouth daily.   isosorbide mononitrate 60 MG 24 hr tablet Commonly known as: IMDUR Take 60 mg by mouth daily.   Narcan 4 MG/0.1ML Liqd nasal spray kit Generic drug: naloxone ADMINISTER A SINGLE SPRAY IN ONE NOSTRIL UPON SIGNS OF OPIOID OVERDOSE. CALL 911. REPEAT AFTER 3 MINUTES IF NO RESPONSE.   nitroGLYCERIN 0.4 MG SL tablet Commonly known as: NITROSTAT See admin instructions.   oxyCODONE 40 mg 12 hr tablet Commonly known as: OxyCONTIN Take 1 tablet (40 mg total) by mouth every 12 (twelve) hours. What changed: Another medication with the same name was changed. Make sure you understand how and when to take each. Changed by: Terald Sleeper, PA-C   oxyCODONE 30 MG 12 hr tablet Commonly known as: OxyCONTIN Take 1 tablet (30 mg total) by mouth 2 (two) times a day. What changed:   medication strength  how much to take  when to take this Changed by: Terald Sleeper, PA-C   potassium chloride 10 MEQ tablet Commonly known as: K-DUR Take 1 tablet (10 mEq total) by mouth daily.   rosuvastatin 5 MG tablet Commonly known as: CRESTOR Take 5 mg by mouth daily.   sulfamethoxazole-trimethoprim 800-160 MG tablet Commonly known as: Bactrim DS Take 1 tablet by mouth 2 (two) times  daily. Started by: Terald Sleeper, PA-C   topiramate 100 MG tablet Commonly known as: TOPAMAX Take 1 tablet (100 mg total) by mouth 2 (two) times daily.   vitamin C 500 MG tablet Commonly known as: ASCORBIC ACID Take 500 mg by mouth daily.   zolpidem 5 MG tablet Commonly known as: AMBIEN Take 1 tablet (5 mg total) by mouth at bedtime as needed. for sleep          Objective:    BP 101/67   Pulse 89   Temp 98.1 F (36.7 C) (Oral)   Ht 5' 6"  (1.676 m)   Wt 150 lb 12.8 oz (68.4 kg)   BMI 24.34 kg/m   No Known Allergies  Wt Readings from Last 3 Encounters:  11/20/18 150 lb 12.8 oz (68.4 kg)  07/26/18 160 lb 3.2 oz (72.7 kg)  06/17/18 163 lb 6.4 oz (74.1 kg)    Physical Exam Constitutional:      Appearance: She is  well-developed.  HENT:     Head: Normocephalic and atraumatic.  Eyes:     Conjunctiva/sclera: Conjunctivae normal.     Pupils: Pupils are equal, round, and reactive to light.  Cardiovascular:     Rate and Rhythm: Normal rate and regular rhythm.     Heart sounds: Normal heart sounds.  Pulmonary:     Effort: Pulmonary effort is normal.     Breath sounds: Normal breath sounds.  Abdominal:     General: Bowel sounds are normal. There is no distension.     Palpations: Abdomen is soft. There is no mass.     Tenderness: There is abdominal tenderness in the suprapubic area. There is no guarding or rebound.  Musculoskeletal:     Lumbar back: She exhibits decreased range of motion, tenderness, pain and spasm.       Back:  Skin:    General: Skin is warm and dry.     Findings: No rash.  Neurological:     Mental Status: She is alert and oriented to person, place, and time.     Deep Tendon Reflexes: Reflexes are normal and symmetric.  Psychiatric:        Behavior: Behavior normal.        Thought Content: Thought content normal.        Judgment: Judgment normal.         Assessment & Plan:   1. Dysuria - Urine Culture - Urinalysis, Complete  2. DDD  (degenerative disc disease), lumbar - NARCAN 4 MG/0.1ML LIQD nasal spray kit; ADMINISTER A SINGLE SPRAY IN ONE NOSTRIL UPON SIGNS OF OPIOID OVERDOSE. CALL 911. REPEAT AFTER 3 MINUTES IF NO RESPONSE. - oxyCODONE (OXYCONTIN) 40 mg 12 hr tablet; Take 1 tablet (40 mg total) by mouth every 12 (twelve) hours.  Dispense: 60 tablet; Refill: 0 - oxyCODONE (OXYCONTIN) 30 MG 12 hr tablet; Take 1 tablet (30 mg total) by mouth 2 (two) times a day.  Dispense: 60 each; Refill: 0   amitriptyline 100 mg at bedtime OxyContin 40 mg twice daily, next dosing reduction will be 30 mg BID Gabapentin 100 mg at bedtime   3. Osteoarthritis of spine with radiculopathy, lumbosacral region - NARCAN 4 MG/0.1ML LIQD nasal spray kit; ADMINISTER A SINGLE SPRAY IN ONE NOSTRIL UPON SIGNS OF OPIOID OVERDOSE. CALL 911. REPEAT AFTER 3 MINUTES IF NO RESPONSE. - oxyCODONE (OXYCONTIN) 40 mg 12 hr tablet; Take 1 tablet (40 mg total) by mouth every 12 (twelve) hours.  Dispense: 60 tablet; Refill: 0 - oxyCODONE (OXYCONTIN) 30 MG 12 hr tablet; Take 1 tablet (30 mg total) by mouth 2 (two) times a day.  Dispense: 60 each; Refill: 0  4. Rheumatoid arthritis of multiple sites with negative rheumatoid factor (HCC)  5. Bursitis of other bursa of right hip  6. Compression fracture of body of thoracic vertebra (HCC) - NARCAN 4 MG/0.1ML LIQD nasal spray kit; ADMINISTER A SINGLE SPRAY IN ONE NOSTRIL UPON SIGNS OF OPIOID OVERDOSE. CALL 911. REPEAT AFTER 3 MINUTES IF NO RESPONSE. - oxyCODONE (OXYCONTIN) 40 mg 12 hr tablet; Take 1 tablet (40 mg total) by mouth every 12 (twelve) hours.  Dispense: 60 tablet; Refill: 0 - oxyCODONE (OXYCONTIN) 30 MG 12 hr tablet; Take 1 tablet (30 mg total) by mouth 2 (two) times a day.  Dispense: 60 each; Refill: 0  7. Acute cystitis without hematuria - sulfamethoxazole-trimethoprim (BACTRIM DS) 800-160 MG tablet; Take 1 tablet by mouth 2 (two) times daily.  Dispense: 14 tablet; Refill: 0  Continue all other  maintenance medications as listed above.  Follow up plan: Return in about 2 months (around 01/20/2019).  Educational handout given for [place] PA-C [place] [address]  [place], [place] [address] [phone]   11/24/2018, 10:07 PM

## 2018-12-10 ENCOUNTER — Other Ambulatory Visit: Payer: Self-pay | Admitting: Physician Assistant

## 2018-12-10 DIAGNOSIS — M4727 Other spondylosis with radiculopathy, lumbosacral region: Secondary | ICD-10-CM

## 2018-12-10 DIAGNOSIS — M5136 Other intervertebral disc degeneration, lumbar region: Secondary | ICD-10-CM

## 2018-12-24 ENCOUNTER — Telehealth: Payer: Self-pay | Admitting: Physician Assistant

## 2018-12-24 ENCOUNTER — Other Ambulatory Visit: Payer: Self-pay | Admitting: Physician Assistant

## 2018-12-24 DIAGNOSIS — S22000A Wedge compression fracture of unspecified thoracic vertebra, initial encounter for closed fracture: Secondary | ICD-10-CM

## 2018-12-24 DIAGNOSIS — M5136 Other intervertebral disc degeneration, lumbar region: Secondary | ICD-10-CM

## 2018-12-24 DIAGNOSIS — M4727 Other spondylosis with radiculopathy, lumbosacral region: Secondary | ICD-10-CM

## 2018-12-24 MED ORDER — OXYCODONE HCL ER 40 MG PO T12A: 40.0000 mg | EXTENDED_RELEASE_TABLET | Freq: Two times a day (BID) | ORAL | 0 refills | Status: DC

## 2018-12-24 NOTE — Telephone Encounter (Signed)
PA for Oxycontin ER 30mg -In Process  Your information has been submitted to [facility] and is being reviewed. You may close this dialog, return to your dashboard, and perform other tasks. You will receive an electronic determination in CoverMyMeds within 72-120 hours; you'll also receive a faxed copy. You can see the latest determination by locating this request on your dashboard or reopening this request. If Christella Scheuermann has not responded in 120 hours, contact [employer] at [phone].

## 2018-12-24 NOTE — Telephone Encounter (Signed)
Pt says she is completely out of med. She took the new 30mg  to pharmacy this am and it needs a PA. I called [facility] in [place] and had them fax PA to [place]. Pt is worried the PA will take a few days and she will be out of meds. Can she have the 40mg  sent in for a few days while waiting on PA

## 2018-12-24 NOTE — Telephone Encounter (Signed)
Pt aware.

## 2018-12-24 NOTE — Telephone Encounter (Signed)
I have sent a 15-day supply the OxyContin 40 mg to [facility] in [facility]

## 2018-12-25 NOTE — Telephone Encounter (Signed)
Prior Auth for OxyCONTIN 30MG  er tablets-APPROVED   PA Case: [id number], Status: Approved, Coverage Starts on: 12/25/2018 12:00:00 AM, Coverage Ends on: 12/25/2019 12:00:00 AM.

## 2019-01-07 ENCOUNTER — Other Ambulatory Visit: Payer: Self-pay | Admitting: Physician Assistant

## 2019-01-08 ENCOUNTER — Other Ambulatory Visit: Payer: Self-pay | Admitting: Physician Assistant

## 2019-01-17 ENCOUNTER — Other Ambulatory Visit: Payer: Self-pay

## 2019-01-17 ENCOUNTER — Encounter: Payer: Self-pay | Admitting: Physician Assistant

## 2019-01-20 ENCOUNTER — Ambulatory Visit (INDEPENDENT_AMBULATORY_CARE_PROVIDER_SITE_OTHER): Payer: Managed Care, Other (non HMO) | Admitting: Physician Assistant

## 2019-01-20 ENCOUNTER — Other Ambulatory Visit: Payer: Self-pay

## 2019-01-20 ENCOUNTER — Encounter: Payer: Self-pay | Admitting: Physician Assistant

## 2019-01-20 VITALS — BP 109/74 | HR 91 | Temp 97.4°F | Ht 66.0 in | Wt 147.0 lb

## 2019-01-20 DIAGNOSIS — M4727 Other spondylosis with radiculopathy, lumbosacral region: Secondary | ICD-10-CM | POA: Diagnosis not present

## 2019-01-20 DIAGNOSIS — M2550 Pain in unspecified joint: Secondary | ICD-10-CM | POA: Diagnosis not present

## 2019-01-20 DIAGNOSIS — S22000A Wedge compression fracture of unspecified thoracic vertebra, initial encounter for closed fracture: Secondary | ICD-10-CM

## 2019-01-20 DIAGNOSIS — M791 Myalgia, unspecified site: Secondary | ICD-10-CM | POA: Diagnosis not present

## 2019-01-20 DIAGNOSIS — M5136 Other intervertebral disc degeneration, lumbar region: Secondary | ICD-10-CM | POA: Diagnosis not present

## 2019-01-20 MED ORDER — OXYCODONE HCL ER 30 MG PO T12A: 30.0000 mg | EXTENDED_RELEASE_TABLET | Freq: Two times a day (BID) | ORAL | 0 refills | Status: DC

## 2019-01-21 LAB — ANA W/REFLEX: Anti Nuclear Antibody (ANA): NEGATIVE

## 2019-01-23 ENCOUNTER — Telehealth: Payer: Self-pay | Admitting: Physician Assistant

## 2019-01-23 NOTE — Progress Notes (Signed)
BP 109/74   Pulse 91   Temp (!) 97.4 F (36.3 C) (Temporal)   Ht 5' 6"  (1.676 m)   Wt 147 lb (66.7 kg)   BMI 23.73 kg/m    Subjective:    Patient ID: [name], female    DOB: [birth year], 63 y.o.   MRN: [id number]  HPI: [name] is a 63 y.o. female presenting on 01/20/2019 for No chief complaint on file.  PAIN ASSESSMENT: Cause of pain-degenerative disc disease at all levels, osteoarthritis throughout spine and other joints. Compression fracture  Stable T11 superior endplate compression fracture since December with stable or decreased trace marrow edema. Mild retropulsion of bone contributing to mild right T10 foraminal stenosis, stable. 2. Edematous bilateral T10-T11 facets, and trace edema in the T10 spinous process probably related to the altered biomechanics at that level.Previous left hemilaminectomy at L2-3, L3-4 and L4-5. Wide patency of the central canal. At L4-5, there is mild bilateral foraminal narrowing right more than Left sided decompression at L2-3 and L3-4. Wide patency of the canal and no evidence of significant foraminal compromise. Retrolisthesis of 2 mm at each level. At L4-5, there are biforaminal annular rents and minimal facet hypertrophy. It is conceivable that the L4 nerve roots could be irritated as they exit but it does not appear that they are grossly compressed.   In the past the patient had tried gabapentin when she was seen by Dr. Joya Salm. She was also seen by Dr. Patrice Paradise in the past also. She states that it did not help. But is willing to try it again.As far back in 2000 Dr. Joya Salm has been taking care of her degenerative disc disease throughout her spine.    Current medications- amitriptyline 100 mg at bedtime Oxycontin reduction will be 30 mg BID, try for 2 months and then new visit to discuss the next reduction  She did start gabapentin 188m at bedtime.  She tried to get accustomed to it.  It caused her dizziness and blurred vision..  Once she stopped it the side effect went away. Muscle relaxants make her too sleepy, cannot take NSAIDs due to coronary artery disease that is advanced. Medication side effects-no  Pain on scale of 1-10- 8-9 Frequency-Daily What increases pain-standing and walking What makes pain Better-rest Effects on ADL -moderate to severe Any change in general medical condition-getting worked up for abnormal skin lesions and monoclonal abnormality  Effectiveness of current meds-good Adverse reactions form pain meds-no PMP AWARE website reviewed: Yes Any suspicious activity on PMP Aware: No  MME daily dose:started at 180 now down to 90.   Contract on file 08/01/2018 Last UDS 08/01/2018  History of overdose or risk of abuse no  Past Medical History:  Diagnosis Date  . Anxiety   . Arthritis   . GERD (gastroesophageal reflux disease)   . Hyperlipidemia   . Hypertension    Relevant past medical, surgical, family and social history reviewed and updated as indicated. Interim medical history since our last visit reviewed. Allergies and medications reviewed and updated. DATA REVIEWED: CHART IN EPIC  Family History reviewed for pertinent findings.  Review of Systems  Constitutional: Negative.   HENT: Negative.   Eyes: Negative.   Respiratory: Negative.   Gastrointestinal: Negative.   Genitourinary: Negative.   Musculoskeletal: Positive for arthralgias, back pain, joint swelling and myalgias.    Allergies as of 01/20/2019      Reactions   Gabapentin    Dizziness and blurred vision  Medication List       Accurate as of January 20, 2019 11:59 PM. If you have any questions, ask your nurse or doctor.        STOP taking these medications   calcitonin (salmon) 200 UNIT/ACT nasal spray Commonly known as: MIACALCIN/FORTICAL Stopped by: Terald Sleeper, PA-C   sulfamethoxazole-trimethoprim 800-160 MG tablet Commonly known as: Bactrim DS Stopped by: Terald Sleeper, PA-C     TAKE  these medications   amitriptyline 100 MG tablet Commonly known as: ELAVIL Take 1 tablet (100 mg total) by mouth at bedtime.   aspirin 81 MG chewable tablet Chew 81 mg by mouth daily.   atenolol 25 MG tablet Commonly known as: TENORMIN Take 25 mg by mouth 2 (two) times daily.   gabapentin 100 MG capsule Commonly known as: NEURONTIN TAKE 1-2 CAPSULES (100-200 MG TOTAL) BY MOUTH 3 (THREE) TIMES DAILY.   hydrochlorothiazide 25 MG tablet Commonly known as: HYDRODIURIL Take 25 mg by mouth daily.   isosorbide mononitrate 60 MG 24 hr tablet Commonly known as: IMDUR Take 60 mg by mouth daily.   Narcan 4 MG/0.1ML Liqd nasal spray kit Generic drug: naloxone ADMINISTER A SINGLE SPRAY IN ONE NOSTRIL UPON SIGNS OF OPIOID OVERDOSE. CALL 911. REPEAT AFTER 3 MINUTES IF NO RESPONSE.   nitroGLYCERIN 0.4 MG SL tablet Commonly known as: NITROSTAT See admin instructions.   oxyCODONE 30 MG 12 hr tablet Commonly known as: OxyCONTIN Take 1 tablet (30 mg total) by mouth 2 (two) times daily. What changed: when to take this Changed by: Terald Sleeper, PA-C   oxyCODONE 30 MG 12 hr tablet Commonly known as: OxyCONTIN Take 1 tablet (30 mg total) by mouth 2 (two) times daily. What changed:   medication strength  how much to take  when to take this Changed by: Terald Sleeper, PA-C   potassium chloride 10 MEQ tablet Commonly known as: K-DUR Take 1 tablet (10 mEq total) by mouth daily.   rosuvastatin 5 MG tablet Commonly known as: CRESTOR Take 5 mg by mouth daily.   topiramate 100 MG tablet Commonly known as: TOPAMAX Take 1 tablet (100 mg total) by mouth 2 (two) times daily.   vitamin C 500 MG tablet Commonly known as: ASCORBIC ACID Take 500 mg by mouth daily.   zolpidem 5 MG tablet Commonly known as: AMBIEN TAKE 1 TABLET BY MOUTH AT BEDTIME AS NEEDED FOR SLEEP          Objective:    BP 109/74   Pulse 91   Temp (!) 97.4 F (36.3 C) (Temporal)   Ht 5' 6"  (1.676 m)   Wt 147  lb (66.7 kg)   BMI 23.73 kg/m   Allergies  Allergen Reactions  . Gabapentin     Dizziness and blurred vision    Wt Readings from Last 3 Encounters:  01/20/19 147 lb (66.7 kg)  11/20/18 150 lb 12.8 oz (68.4 kg)  07/26/18 160 lb 3.2 oz (72.7 kg)    Physical Exam Constitutional:      General: She is not in acute distress.    Appearance: Normal appearance. She is well-developed.  HENT:     Head: Normocephalic and atraumatic.  Cardiovascular:     Rate and Rhythm: Normal rate.  Pulmonary:     Effort: Pulmonary effort is normal.  Musculoskeletal:     Thoracic back: She exhibits decreased range of motion, tenderness, pain and spasm.     Lumbar back: She exhibits decreased range of motion,  tenderness, pain and spasm.  Skin:    General: Skin is warm and dry.     Findings: No rash.  Neurological:     Mental Status: She is alert and oriented to person, place, and time.     Motor: Weakness present.     Gait: Gait abnormal.     Deep Tendon Reflexes: Reflexes are normal and symmetric.     Comments: Weak quadriceps     Results for orders placed or performed in visit on 01/20/19  ANA w/Reflex  Result Value Ref Range   Anti Nuclear Antibody (ANA) Negative Negative      Assessment & Plan:   1. Myalgia - ANA w/Reflex  2. Arthralgia, unspecified joint - ANA w/Reflex  3. DDD (degenerative disc disease), lumbar - oxyCODONE (OXYCONTIN) 30 MG 12 hr tablet; Take 1 tablet (30 mg total) by mouth 2 (two) times daily.  Dispense: 60 tablet; Refill: 0 - oxyCODONE (OXYCONTIN) 30 MG 12 hr tablet; Take 1 tablet (30 mg total) by mouth 2 (two) times daily.  Dispense: 60 tablet; Refill: 0  Oxycontin reduction will be 30 mg BID, try for 2 months and then new visit to discuss the next reduction  4. Osteoarthritis of spine with radiculopathy, lumbosacral region - oxyCODONE (OXYCONTIN) 30 MG 12 hr tablet; Take 1 tablet (30 mg total) by mouth 2 (two) times daily.  Dispense: 60 tablet; Refill: 0 -  oxyCODONE (OXYCONTIN) 30 MG 12 hr tablet; Take 1 tablet (30 mg total) by mouth 2 (two) times daily.  Dispense: 60 tablet; Refill: 0  Oxycontin reduction will be 30 mg BID, try for 2 months and then new visit to discuss the next reduction  5. Compression fracture of body of thoracic vertebra (HCC) - oxyCODONE (OXYCONTIN) 30 MG 12 hr tablet; Take 1 tablet (30 mg total) by mouth 2 (two) times daily.  Dispense: 60 tablet; Refill: 0 - oxyCODONE (OXYCONTIN) 30 MG 12 hr tablet; Take 1 tablet (30 mg total) by mouth 2 (two) times daily.  Dispense: 60 tablet; Refill: 0  Oxycontin reduction will be 30 mg BID, try for 2 months and then new visit to discuss the next reduction  Continue all other maintenance medications as listed above.  Follow up plan: Return in about 2 months (around 03/22/2019).  Educational handout given for [place] PA-C [place] [address]  [place], [place] [address] [phone]   01/23/2019, 12:53 PM

## 2019-01-23 NOTE — Telephone Encounter (Signed)
I will fix this.  

## 2019-01-23 NOTE — Telephone Encounter (Signed)
This patient came to out office on 8/11 for a mammogram on the bus.  However she got put on the office schedule, and now is credited with a NO SHOW. And it was not.  Can you look into and correct it?

## 2019-02-05 ENCOUNTER — Encounter: Payer: Self-pay | Admitting: Physician Assistant

## 2019-03-14 ENCOUNTER — Other Ambulatory Visit: Payer: Self-pay | Admitting: Physician Assistant

## 2019-03-14 DIAGNOSIS — M5136 Other intervertebral disc degeneration, lumbar region: Secondary | ICD-10-CM

## 2019-03-20 DIAGNOSIS — C44629 Squamous cell carcinoma of skin of left upper limb, including shoulder: Secondary | ICD-10-CM | POA: Diagnosis not present

## 2019-03-20 DIAGNOSIS — L905 Scar conditions and fibrosis of skin: Secondary | ICD-10-CM | POA: Diagnosis not present

## 2019-03-21 ENCOUNTER — Other Ambulatory Visit: Payer: Self-pay

## 2019-03-24 ENCOUNTER — Encounter: Payer: Self-pay | Admitting: Physician Assistant

## 2019-03-24 ENCOUNTER — Other Ambulatory Visit: Payer: Self-pay

## 2019-03-24 ENCOUNTER — Ambulatory Visit (INDEPENDENT_AMBULATORY_CARE_PROVIDER_SITE_OTHER): Payer: Medicare HMO | Admitting: Physician Assistant

## 2019-03-24 DIAGNOSIS — M5136 Other intervertebral disc degeneration, lumbar region: Secondary | ICD-10-CM

## 2019-03-24 DIAGNOSIS — S22000A Wedge compression fracture of unspecified thoracic vertebra, initial encounter for closed fracture: Secondary | ICD-10-CM

## 2019-03-24 DIAGNOSIS — M4727 Other spondylosis with radiculopathy, lumbosacral region: Secondary | ICD-10-CM

## 2019-03-24 MED ORDER — OXYCODONE-ACETAMINOPHEN 10-325 MG PO TABS: 1.0000 | ORAL_TABLET | Freq: Four times a day (QID) | ORAL | 0 refills | Status: DC | PRN

## 2019-03-27 NOTE — Progress Notes (Signed)
BP 119/76   Pulse 88   Temp 97.6 F (36.4 C) (Temporal)   Ht _0  (1.676 m)   Wt 148 lb 9.6 oz (67.4 kg)   SpO2 99%   BMI 23.98 kg/m    Subjective:    Patient ID: [name], female    DOB: 06/08/1955, 63 y.o.   MRN: [id number]  HPI: [name] is a 63 y.o. female presenting on 03/24/2019 for Pain  We are trying to titrate her medication down.  She has been on OxyContin 30 mg 1 twice daily for some time.  And there was a prior Auth obtained.  I was trying to work on lowering the medication to Percocet 1 every 6 hours will be the Percocet 10/325.  We may have to do a prior authorization on this.  If this does not go through immediately then we will plan to send in the OxyContin 30 mg because it has had a prior Auth.  She will be getting this filled somewhere 03/31/2019 or after.  PAIN ASSESSMENT: Cause of pain-degenerative disc disease at all levels, osteoarthritis throughout spine and other joints. Compression fracture  Stable T11 superior endplate compression fracture since December with stable or decreased trace marrow edema. Mild retropulsion of bone contributing to mild right T10 foraminal stenosis, stable. 2. Edematous bilateral T10-T11 facets, and trace edema in the T10 spinous process probably related to the altered biomechanics at that level.Previous left hemilaminectomy at L2-3, L3-4 and L4-5. Wide patency of the central canal. At L4-5, there is mild bilateral foraminal narrowing right more than Left sided decompression at L2-3 and L3-4. Wide patency of the canal and no evidence of significant foraminal compromise. Retrolisthesis of 2 mm at each level. At L4-5, there are biforaminal annular rents and minimal facet hypertrophy. It is conceivable that the L4 nerve roots could be irritated as they exit but it does not appear that they are grossly compressed.   In the past the patient had tried gabapentin when she was seen by Dr. Joya Salm. She was also seen by Dr. Patrice Paradise in  the past also. She states that it did not help. But is willing to try it again.As far back in 2000 Dr. Joya Salm has been taking care of her degenerative disc disease throughout her spine.    Current medications- amitriptyline 100 mg at bedtime Oxycontin 30 mg BID, Next fill will be for Percocet 10/320 514 times a day for pain.  She didstart gabapentin 174m at bedtime. She tried to get accustomed to it.  It caused her dizziness and blurred vision.. Once she stopped it the side effect went away. Muscle relaxants make her too sleepy, cannot take NSAIDs due to coronary artery disease that is advanced. Medication side effects-no  Pain on scale of 1-10- 8-9 Frequency-Daily What increases pain-standing and walking What makes pain Better-rest Effects on ADL -moderate to severe Any change in general medical condition-getting worked up for abnormal skin lesions and monoclonal abnormality  Effectiveness of current meds-good Adverse reactions form pain meds-no PMP AWARE website reviewed: Yes Any suspicious activity on PMP Aware: No  MME daily dose: 90, lowering to 60 next month Contract on file 08/01/2018 Last UDS 08/01/2018  History of overdose or risk of abuse no  Past Medical History:  Diagnosis Date  . Anxiety   . Arthritis   . GERD (gastroesophageal reflux disease)   . Hyperlipidemia   . Hypertension    Relevant past medical, surgical, family and social history reviewed and  updated as indicated. Interim medical history since our last visit reviewed. Allergies and medications reviewed and updated. DATA REVIEWED: CHART IN EPIC  Family History reviewed for pertinent findings.  Review of Systems  Constitutional: Negative.  Negative for activity change, fatigue and fever.  HENT: Negative.   Eyes: Negative.   Respiratory: Negative.  Negative for cough.   Cardiovascular: Negative.  Negative for chest pain.  Gastrointestinal: Negative.  Negative for abdominal pain.   Endocrine: Negative.   Genitourinary: Negative.  Negative for dysuria.  Musculoskeletal: Positive for arthralgias, back pain, joint swelling and myalgias.  Skin: Negative.   Neurological: Negative.     Allergies as of 03/24/2019      Reactions   Gabapentin    Dizziness and blurred vision      Medication List       Accurate as of March 24, 2019 11:59 PM. If you have any questions, ask your nurse or doctor.        STOP taking these medications   oxyCODONE 30 MG 12 hr tablet Commonly known as: OxyCONTIN Stopped by: Terald Sleeper, PA-C     TAKE these medications   amitriptyline 100 MG tablet Commonly known as: ELAVIL TAKE 1 TABLET BY MOUTH AT BEDTIME   aspirin 81 MG chewable tablet Chew 81 mg by mouth daily.   atenolol 25 MG tablet Commonly known as: TENORMIN Take 25 mg by mouth 2 (two) times daily.   cholecalciferol 25 MCG (1000 UT) tablet Commonly known as: VITAMIN D3 Take 1,000 Units by mouth daily.   gabapentin 100 MG capsule Commonly known as: NEURONTIN TAKE 1-2 CAPSULES (100-200 MG TOTAL) BY MOUTH 3 (THREE) TIMES DAILY.   hydrochlorothiazide 25 MG tablet Commonly known as: HYDRODIURIL Take 25 mg by mouth daily.   isosorbide mononitrate 60 MG 24 hr tablet Commonly known as: IMDUR Take 60 mg by mouth daily.   Narcan 4 MG/0.1ML Liqd nasal spray kit Generic drug: naloxone ADMINISTER A SINGLE SPRAY IN ONE NOSTRIL UPON SIGNS OF OPIOID OVERDOSE. CALL 911. REPEAT AFTER 3 MINUTES IF NO RESPONSE.   nitroGLYCERIN 0.4 MG SL tablet Commonly known as: NITROSTAT See admin instructions.   oxyCODONE-acetaminophen 10-325 MG tablet Commonly known as: PERCOCET Take 1 tablet by mouth every 6 (six) hours as needed for pain. Started by: Terald Sleeper, PA-C   potassium chloride 10 MEQ tablet Commonly known as: KLOR-CON Take 1 tablet (10 mEq total) by mouth daily.   rosuvastatin 5 MG tablet Commonly known as: CRESTOR Take 5 mg by mouth daily.   topiramate 100 MG  tablet Commonly known as: TOPAMAX Take 1 tablet (100 mg total) by mouth 2 (two) times daily.   vitamin B-12 100 MCG tablet Commonly known as: CYANOCOBALAMIN Take 100 mcg by mouth daily.   vitamin C 500 MG tablet Commonly known as: ASCORBIC ACID Take 500 mg by mouth daily.   zolpidem 5 MG tablet Commonly known as: AMBIEN TAKE 1 TABLET BY MOUTH AT BEDTIME AS NEEDED FOR SLEEP          Objective:    BP 119/76   Pulse 88   Temp 97.6 F (36.4 C) (Temporal)   Ht _0  (1.676 m)   Wt 148 lb 9.6 oz (67.4 kg)   SpO2 99%   BMI 23.98 kg/m   Allergies  Allergen Reactions  . Gabapentin     Dizziness and blurred vision    Wt Readings from Last 3 Encounters:  03/24/19 148 lb 9.6 oz (67.4 kg)  01/20/19 147 lb (66.7 kg)  11/20/18 150 lb 12.8 oz (68.4 kg)    Physical Exam Constitutional:      General: She is not in acute distress.    Appearance: Normal appearance. She is well-developed.  HENT:     Head: Normocephalic and atraumatic.  Cardiovascular:     Rate and Rhythm: Normal rate.  Pulmonary:     Effort: Pulmonary effort is normal.  Musculoskeletal:     Lumbar back: She exhibits decreased range of motion, tenderness, pain and spasm.  Skin:    General: Skin is warm and dry.     Findings: No rash.  Neurological:     Mental Status: She is alert and oriented to person, place, and time.     Deep Tendon Reflexes: Reflexes are normal and symmetric.     Results for orders placed or performed in visit on 01/20/19  ANA w/Reflex  Result Value Ref Range   Anti Nuclear Antibody (ANA) Negative Negative      Assessment & Plan:   1. DDD (degenerative disc disease), lumbar - oxyCODONE-acetaminophen (PERCOCET) 10-325 MG tablet; Take 1 tablet by mouth every 6 (six) hours as needed for pain.  Dispense: 120 tablet; Refill: 0  2. Osteoarthritis of spine with radiculopathy, lumbosacral region - oxyCODONE-acetaminophen (PERCOCET) 10-325 MG tablet; Take 1 tablet by mouth every 6  (six) hours as needed for pain.  Dispense: 120 tablet; Refill: 0  3. Compression fracture of body of thoracic vertebra (HCC) - cholecalciferol (VITAMIN D3) 25 MCG (1000 UT) tablet; Take 1,000 Units by mouth daily. - oxyCODONE-acetaminophen (PERCOCET) 10-325 MG tablet; Take 1 tablet by mouth every 6 (six) hours as needed for pain.  Dispense: 120 tablet; Refill: 0   Continue all other maintenance medications as listed above.  Follow up plan: Return in about 3 months (around 06/24/2019) for recheck medications.  Educational handout given for [place] PA-C [facility] [address]  [place], [place] [address] [phone]   03/27/2019, 9:25 PM

## 2019-04-15 ENCOUNTER — Other Ambulatory Visit: Payer: Self-pay | Admitting: Physician Assistant

## 2019-04-15 DIAGNOSIS — M5136 Other intervertebral disc degeneration, lumbar region: Secondary | ICD-10-CM

## 2019-04-20 ENCOUNTER — Other Ambulatory Visit: Payer: Self-pay | Admitting: Physician Assistant

## 2019-04-20 DIAGNOSIS — G43809 Other migraine, not intractable, without status migrainosus: Secondary | ICD-10-CM

## 2019-04-28 ENCOUNTER — Telehealth: Payer: Self-pay | Admitting: Physician Assistant

## 2019-04-28 NOTE — Telephone Encounter (Signed)
appt made

## 2019-04-29 ENCOUNTER — Ambulatory Visit (INDEPENDENT_AMBULATORY_CARE_PROVIDER_SITE_OTHER): Payer: Medicare HMO | Admitting: Physician Assistant

## 2019-04-29 DIAGNOSIS — M5136 Other intervertebral disc degeneration, lumbar region: Secondary | ICD-10-CM | POA: Diagnosis not present

## 2019-04-29 DIAGNOSIS — S22000D Wedge compression fracture of unspecified thoracic vertebra, subsequent encounter for fracture with routine healing: Secondary | ICD-10-CM

## 2019-04-29 DIAGNOSIS — M4727 Other spondylosis with radiculopathy, lumbosacral region: Secondary | ICD-10-CM | POA: Diagnosis not present

## 2019-04-29 DIAGNOSIS — S22000A Wedge compression fracture of unspecified thoracic vertebra, initial encounter for closed fracture: Secondary | ICD-10-CM

## 2019-04-29 MED ORDER — OXYCODONE-ACETAMINOPHEN 10-325 MG PO TABS: 1.0000 | ORAL_TABLET | Freq: Four times a day (QID) | ORAL | 0 refills | Status: DC | PRN

## 2019-04-29 MED ORDER — OXYCODONE-ACETAMINOPHEN 10-325 MG PO TABS: 1.0000 | ORAL_TABLET | Freq: Three times a day (TID) | ORAL | 0 refills | Status: DC | PRN

## 2019-05-04 ENCOUNTER — Encounter: Payer: Self-pay | Admitting: Physician Assistant

## 2019-05-04 NOTE — Progress Notes (Signed)
Telephone visit  Subjective: Kristin:IBBCWUG pain PCP: Terald Sleeper, PA-C QBV:[id number] B [place] is a 63 y.o. female calls for telephone consult today. Patient provides verbal consent for consult held via phone.  Patient is identified with 2 separate identifiers.  At this time the entire area is on COVID-19 social distancing and stay home orders are in place.  Patient is of higher risk and therefore we are performing this by a virtual method.  Location of patient: home Location of provider: HOME Others present for call: Kristin  PAIN ASSESSMENT: Cause of pain-degenerative disc disease at all levels, osteoarthritis throughout spine and other joints.Compression fracture  Stable T11 superior endplate compression fracture since December with stable or decreased trace marrow edema. Mild retropulsion of bone contributing to mild right T10 foraminal stenosis, stable. 2. Edematous bilateral T10-T11 facets, and trace edema in the T10 spinous process probably related to the altered biomechanics at that level.Previous left hemilaminectomy at L2-3, L3-4 and L4-5. Wide patency of the central canal. At L4-5, there is mild bilateral foraminal narrowing right more than Left sided decompression at L2-3 and L3-4. Wide patency of the canal and Kristin evidence of significant foraminal compromise. Retrolisthesis of 2 mm at each level. At L4-5, there are biforaminal annular rents and minimal facet hypertrophy. It is conceivable that the L4 nerve roots could be irritated as they exit but it does not appear that they are grossly compressed.   In the past the patient had tried gabapentin when she was seen by Dr. Joya Salm. She was also seen by Dr. Patrice Paradise in the past also. She states that it did not help. But is willing to try it again.As far back in 2000 Dr. Joya Salm has been taking care of her degenerative disc disease throughout her spine.    Current medications-  Percocet 10/325 mg 4 times a day for pain.      Patient  has lowered from 180 MME to 60.  She does not feel that she briefly can push it any further. Gabapentin 300 mg at bedtime amitriptyline 100 mg at bedtime  Medication side effects-Kristin  Pain on scale of 1-10- 8-9 Frequency-Daily What increases pain-standing and walking What makes pain Better-rest Effects on ADL -moderate to severe Any change in general medical condition-Kristin  Effectiveness of current meds-good Adverse reactions form pain meds-Kristin PMP AWARE website reviewed: Yes Any suspicious activity on PMP Aware: Kristin  MME daily dose: 60  Referring back to her neurosurgeon Contract on file2/27/2020 Last UDS 08/01/2018  History of overdose or risk of abuse Kristin   ROS: Per HPI  Allergies  Allergen Reactions  . Gabapentin     Dizziness and blurred vision   Past Medical History:  Diagnosis Date  . Anxiety   . Arthritis   . GERD (gastroesophageal reflux disease)   . Hyperlipidemia   . Hypertension     Current Outpatient Medications:  .  amitriptyline (ELAVIL) 100 MG tablet, TAKE 1 TABLET BY MOUTH AT BEDTIME, Disp: 90 tablet, Rfl: 0 .  aspirin 81 MG chewable tablet, Chew 81 mg by mouth daily., Disp: , Rfl:  .  atenolol (TENORMIN) 25 MG tablet, Take 25 mg by mouth 2 (two) times daily. , Disp: , Rfl: 2 .  cholecalciferol (VITAMIN D3) 25 MCG (1000 UT) tablet, Take 1,000 Units by mouth daily., Disp: , Rfl:  .  gabapentin (NEURONTIN) 100 MG capsule, TAKE 1-2 CAPSULES (100-200 MG TOTAL) BY MOUTH 3 (THREE) TIMES DAILY., Disp: 540 capsule, Rfl: 0 .  hydrochlorothiazide (HYDRODIURIL) 25 MG tablet, Take 25 mg by mouth daily., Disp: , Rfl:  .  isosorbide mononitrate (IMDUR) 60 MG 24 hr tablet, Take 60 mg by mouth daily., Disp: , Rfl:  .  NARCAN 4 MG/0.1ML LIQD nasal spray kit, ADMINISTER A SINGLE SPRAY IN ONE NOSTRIL UPON SIGNS OF OPIOID OVERDOSE. CALL 911. REPEAT AFTER 3 MINUTES IF Kristin RESPONSE., Disp: , Rfl:  .  nitroGLYCERIN (NITROSTAT) 0.4 MG SL tablet, See admin instructions., Disp:  , Rfl:  .  oxyCODONE-acetaminophen (PERCOCET) 10-325 MG tablet, Take 1 tablet by mouth every 6 (six) hours as needed for pain., Disp: 120 tablet, Rfl: 0 .  oxyCODONE-acetaminophen (PERCOCET) 10-325 MG tablet, Take 1 tablet by mouth every 8 (eight) hours as needed for pain., Disp: 120 tablet, Rfl: 0 .  oxyCODONE-acetaminophen (PERCOCET) 10-325 MG tablet, Take 1 tablet by mouth every 6 (six) hours as needed for pain., Disp: 120 tablet, Rfl: 0 .  potassium chloride (K-DUR) 10 MEQ tablet, Take 1 tablet (10 mEq total) by mouth daily., Disp: 30 tablet, Rfl: 1 .  rosuvastatin (CRESTOR) 5 MG tablet, Take 5 mg by mouth daily., Disp: , Rfl:  .  topiramate (TOPAMAX) 100 MG tablet, Take 1 tablet by mouth twice daily, Disp: 60 tablet, Rfl: 0 .  vitamin B-12 (CYANOCOBALAMIN) 100 MCG tablet, Take 100 mcg by mouth daily., Disp: , Rfl:  .  vitamin C (ASCORBIC ACID) 500 MG tablet, Take 500 mg by mouth daily., Disp: , Rfl:  .  zolpidem (AMBIEN) 5 MG tablet, TAKE 1 TABLET BY MOUTH AT BEDTIME AS NEEDED FOR SLEEP, Disp: 30 tablet, Rfl: 5  Assessment/ Plan: 63 y.o. female   1. DDD (degenerative disc disease), lumbar - oxyCODONE-acetaminophen (PERCOCET) 10-325 MG tablet; Take 1 tablet by mouth every 6 (six) hours as needed for pain.  Dispense: 120 tablet; Refill: 0 - oxyCODONE-acetaminophen (PERCOCET) 10-325 MG tablet; Take 1 tablet by mouth every 8 (eight) hours as needed for pain.  Dispense: 120 tablet; Refill: 0 - oxyCODONE-acetaminophen (PERCOCET) 10-325 MG tablet; Take 1 tablet by mouth every 6 (six) hours as needed for pain.  Dispense: 120 tablet; Refill: 0  2. Osteoarthritis of spine with radiculopathy, lumbosacral region - oxyCODONE-acetaminophen (PERCOCET) 10-325 MG tablet; Take 1 tablet by mouth every 6 (six) hours as needed for pain.  Dispense: 120 tablet; Refill: 0 - oxyCODONE-acetaminophen (PERCOCET) 10-325 MG tablet; Take 1 tablet by mouth every 8 (eight) hours as needed for pain.  Dispense: 120 tablet;  Refill: 0 - oxyCODONE-acetaminophen (PERCOCET) 10-325 MG tablet; Take 1 tablet by mouth every 6 (six) hours as needed for pain.  Dispense: 120 tablet; Refill: 0  3. Compression fracture of body of thoracic vertebra (HCC) - oxyCODONE-acetaminophen (PERCOCET) 10-325 MG tablet; Take 1 tablet by mouth every 6 (six) hours as needed for pain.  Dispense: 120 tablet; Refill: 0 - oxyCODONE-acetaminophen (PERCOCET) 10-325 MG tablet; Take 1 tablet by mouth every 8 (eight) hours as needed for pain.  Dispense: 120 tablet; Refill: 0 - oxyCODONE-acetaminophen (PERCOCET) 10-325 MG tablet; Take 1 tablet by mouth every 6 (six) hours as needed for pain.  Dispense: 120 tablet; Refill: 0   Kristin follow-ups on file.  Continue all other maintenance medications as listed above.  Start time: 8:12 AM End time: 8:23 AM  Meds ordered this encounter  Medications  . oxyCODONE-acetaminophen (PERCOCET) 10-325 MG tablet    Sig: Take 1 tablet by mouth every 6 (six) hours as needed for pain.    Dispense:  120 tablet  Refill:  0    Fill 06/27/2019    Order Specific Question:   Supervising Provider    Answer:   Janora Norlander [[id number]]  . oxyCODONE-acetaminophen (PERCOCET) 10-325 MG tablet    Sig: Take 1 tablet by mouth every 8 (eight) hours as needed for pain.    Dispense:  120 tablet    Refill:  0    Fill 05/28/2019    Order Specific Question:   Supervising Provider    Answer:   Janora Norlander [[id number]]  . oxyCODONE-acetaminophen (PERCOCET) 10-325 MG tablet    Sig: Take 1 tablet by mouth every 6 (six) hours as needed for pain.    Dispense:  120 tablet    Refill:  0    Fill 04/29/19    Order Specific Question:   Supervising Provider    Answer:   Janora Norlander [[id number]]    Particia Nearing PA-C [place] [phone]

## 2019-05-07 NOTE — Progress Notes (Signed)
Patient has successfully reduced her dose down to 60 MME's.  I agree with specialist evaluation.  We will try and limit controlled substances in this patient but current use seems appropriate.  Her UDS was appropriate and she is up-to-date on her controlled substance contract.  Continue interval follow-up.  Would recommend at minimum every other appointment to be face-to-face given class II controlled substance needs.  Ashly M. Lajuana Ripple, [facility]

## 2019-06-13 DIAGNOSIS — D472 Monoclonal gammopathy: Secondary | ICD-10-CM | POA: Diagnosis not present

## 2019-06-13 DIAGNOSIS — R7303 Prediabetes: Secondary | ICD-10-CM | POA: Diagnosis not present

## 2019-06-13 DIAGNOSIS — E785 Hyperlipidemia, unspecified: Secondary | ICD-10-CM | POA: Diagnosis not present

## 2019-06-13 DIAGNOSIS — M1990 Unspecified osteoarthritis, unspecified site: Secondary | ICD-10-CM | POA: Diagnosis not present

## 2019-06-13 DIAGNOSIS — I25111 Atherosclerotic heart disease of native coronary artery with angina pectoris with documented spasm: Secondary | ICD-10-CM | POA: Diagnosis not present

## 2019-06-13 DIAGNOSIS — I10 Essential (primary) hypertension: Secondary | ICD-10-CM | POA: Diagnosis not present

## 2019-06-24 DIAGNOSIS — Z23 Encounter for immunization: Secondary | ICD-10-CM | POA: Diagnosis not present

## 2019-06-26 ENCOUNTER — Other Ambulatory Visit: Payer: Self-pay | Admitting: Physician Assistant

## 2019-06-26 DIAGNOSIS — G43809 Other migraine, not intractable, without status migrainosus: Secondary | ICD-10-CM

## 2019-06-30 ENCOUNTER — Other Ambulatory Visit: Payer: Self-pay

## 2019-07-01 ENCOUNTER — Ambulatory Visit (INDEPENDENT_AMBULATORY_CARE_PROVIDER_SITE_OTHER): Payer: Medicare HMO | Admitting: Physician Assistant

## 2019-07-01 ENCOUNTER — Encounter: Payer: Self-pay | Admitting: Physician Assistant

## 2019-07-01 DIAGNOSIS — M4727 Other spondylosis with radiculopathy, lumbosacral region: Secondary | ICD-10-CM | POA: Diagnosis not present

## 2019-07-01 DIAGNOSIS — S22000D Wedge compression fracture of unspecified thoracic vertebra, subsequent encounter for fracture with routine healing: Secondary | ICD-10-CM

## 2019-07-01 DIAGNOSIS — G43809 Other migraine, not intractable, without status migrainosus: Secondary | ICD-10-CM

## 2019-07-01 DIAGNOSIS — S22000A Wedge compression fracture of unspecified thoracic vertebra, initial encounter for closed fracture: Secondary | ICD-10-CM

## 2019-07-01 DIAGNOSIS — M5136 Other intervertebral disc degeneration, lumbar region: Secondary | ICD-10-CM

## 2019-07-01 MED ORDER — OXYCODONE-ACETAMINOPHEN 10-325 MG PO TABS: 1.0000 | ORAL_TABLET | Freq: Three times a day (TID) | ORAL | 0 refills | Status: DC | PRN

## 2019-07-01 MED ORDER — OXYCODONE-ACETAMINOPHEN 10-325 MG PO TABS: 1.0000 | ORAL_TABLET | Freq: Four times a day (QID) | ORAL | 0 refills | Status: DC | PRN

## 2019-07-01 MED ORDER — ZOLPIDEM TARTRATE 5 MG PO TABS: 5.0000 mg | ORAL_TABLET | Freq: Every evening | ORAL | 5 refills | Status: DC | PRN

## 2019-07-01 MED ORDER — TOPIRAMATE 100 MG PO TABS: 100.0000 mg | ORAL_TABLET | Freq: Two times a day (BID) | ORAL | 11 refills | Status: DC

## 2019-07-01 MED ORDER — AMITRIPTYLINE HCL 100 MG PO TABS: 100.0000 mg | ORAL_TABLET | Freq: Every day | ORAL | 3 refills | Status: DC

## 2019-07-03 LAB — TOXASSURE SELECT 13 (MW), URINE

## 2019-07-06 NOTE — Progress Notes (Signed)
Acute Office Visit  Subjective:    Patient ID: [name], female    DOB: [birth year], 64 y.o.   MRN: [id number]  Chief Complaint  Patient presents with  . Pain    3 month     Back Pain The current episode started more than 1 year ago. The problem occurs constantly. The problem has been gradually worsening since onset. The pain is present in the lumbar spine, sacro-iliac and thoracic spine. The quality of the pain is described as aching and shooting. The pain is at a severity of 7/10. The pain is moderate. The pain is the same all the time. The symptoms are aggravated by bending, standing and twisting. Stiffness is present all day. She has tried analgesics, muscle relaxant, NSAIDs and heat for the symptoms. The treatment provided mild relief.  Arthritis Presents for follow-up visit. She complains of pain and stiffness. The symptoms have been worsening. Affected locations include the right shoulder, left shoulder, right hip, left hip, right knee and left knee. Her pain is at a severity of 5/10.   PAIN ASSESSMENT: Cause of pain-degenerative disc disease at all levels, osteoarthritis throughout spine and other joints.Compression fracture  Stable T11 superior endplate compression fracture since December with stable or decreased trace marrow edema. Mild retropulsion of bone contributing to mild right T10 foraminal stenosis, stable. 2. Edematous bilateral T10-T11 facets, and trace edema in the T10 spinous process probably related to the altered biomechanics at that level.Previous left hemilaminectomy at L2-3, L3-4 and L4-5. Wide patency of the central canal. At L4-5, there is mild bilateral foraminal narrowing right more than Left sided decompression at L2-3 and L3-4. Wide patency of the canal and no evidence of significant foraminal compromise. Retrolisthesis of 2 mm at each level. At L4-5, there are biforaminal annular rents and minimal facet hypertrophy. It is conceivable that the L4 nerve  roots could be irritated as they exit but it does not appear that they are grossly compressed.   In the past the patient had tried gabapentin when she was seen by Dr. Joya Salm. She was also seen by Dr. Patrice Paradise in the past also. She states that it did not help. But is willing to try it again.As far back in 2000 Dr. Joya Salm has been taking care of her degenerative disc disease throughout her spine.    Current medications-  Percocet 10/325 mg 4 times a day for pain.      Patient has lowered from 180 MME to 60.  She does not feel that she briefly can push it any further. Gabapentin 300 mg at bedtime amitriptyline 100 mg at bedtime  Medication side effects-no  Pain on scale of 1-10- 8-9 Frequency-Daily What increases pain-standing and walking What makes pain Better-rest Effects on ADL -moderate to severe Any change in general medical condition-no  Effectiveness of current meds-good Adverse reactions form pain meds-no PMP AWARE website reviewed: Yes Any suspicious activity on PMP Aware: No  MME daily dose:45 Referring back to her neurosurgeon Contract on file1/26/21 Last UDS 07/01/19  History of overdose or risk of abuse no   Past Medical History:  Diagnosis Date  . Anxiety   . Arthritis   . GERD (gastroesophageal reflux disease)   . Hyperlipidemia   . Hypertension     Past Surgical History:  Procedure Laterality Date  . ABDOMINAL HYSTERECTOMY    . CERVICAL FUSION    . KNEE ARTHROSCOPY Left   . LUMBAR FUSION      Family History  Problem Relation Age of Onset  . Congestive Heart Failure Mother   . Arthritis Mother   . Cancer Father        lung  . Arthritis/Rheumatoid Maternal Grandmother   . Cancer Paternal Grandfather        skin    Social History   Socioeconomic History  . Marital status: Married    Spouse name: Not on file  . Number of children: Not on file  . Years of education: Not on file  . Highest education level: Not on file    Occupational History  . Not on file  Tobacco Use  . Smoking status: Never Smoker  . Smokeless tobacco: Never Used  Substance and Sexual Activity  . Alcohol use: No  . Drug use: No  . Sexual activity: Not on file  Other Topics Concern  . Not on file  Social History Narrative  . Not on file   Social Determinants of Health   Financial Resource Strain:   . Difficulty of Paying Living Expenses: Not on file  Food Insecurity:   . Worried About Charity fundraiser in the Last Year: Not on file  . Ran Out of Food in the Last Year: Not on file  Transportation Needs:   . Lack of Transportation (Medical): Not on file  . Lack of Transportation (Non-Medical): Not on file  Physical Activity:   . Days of Exercise per Week: Not on file  . Minutes of Exercise per Session: Not on file  Stress:   . Feeling of Stress : Not on file  Social Connections:   . Frequency of Communication with Friends and Family: Not on file  . Frequency of Social Gatherings with Friends and Family: Not on file  . Attends Religious Services: Not on file  . Active Member of Clubs or Organizations: Not on file  . Attends Archivist Meetings: Not on file  . Marital Status: Not on file  Intimate Partner Violence:   . Fear of Current or Ex-Partner: Not on file  . Emotionally Abused: Not on file  . Physically Abused: Not on file  . Sexually Abused: Not on file    Outpatient Medications Prior to Visit  Medication Sig Dispense Refill  . aspirin 81 MG chewable tablet Chew 81 mg by mouth daily.    Marland Kitchen atenolol (TENORMIN) 25 MG tablet Take 25 mg by mouth daily.   2  . cholecalciferol (VITAMIN D3) 25 MCG (1000 UT) tablet Take 1,000 Units by mouth daily.    . isosorbide mononitrate (IMDUR) 60 MG 24 hr tablet Take 60 mg by mouth daily.    Marland Kitchen NARCAN 4 MG/0.1ML LIQD nasal spray kit ADMINISTER A SINGLE SPRAY IN ONE NOSTRIL UPON SIGNS OF OPIOID OVERDOSE. CALL 911. REPEAT AFTER 3 MINUTES IF NO RESPONSE.    .  nitroGLYCERIN (NITROSTAT) 0.4 MG SL tablet See admin instructions.    . rosuvastatin (CRESTOR) 5 MG tablet Take 5 mg by mouth daily.    . vitamin B-12 (CYANOCOBALAMIN) 100 MCG tablet Take 100 mcg by mouth daily.    . vitamin C (ASCORBIC ACID) 500 MG tablet Take 500 mg by mouth daily.    Marland Kitchen amitriptyline (ELAVIL) 100 MG tablet TAKE 1 TABLET BY MOUTH AT BEDTIME 90 tablet 0  . gabapentin (NEURONTIN) 100 MG capsule TAKE 1-2 CAPSULES (100-200 MG TOTAL) BY MOUTH 3 (THREE) TIMES DAILY. 540 capsule 0  . oxyCODONE-acetaminophen (PERCOCET) 10-325 MG tablet Take 1 tablet by mouth every 6 (six)  hours as needed for pain. 120 tablet 0  . oxyCODONE-acetaminophen (PERCOCET) 10-325 MG tablet Take 1 tablet by mouth every 8 (eight) hours as needed for pain. 120 tablet 0  . oxyCODONE-acetaminophen (PERCOCET) 10-325 MG tablet Take 1 tablet by mouth every 6 (six) hours as needed for pain. 120 tablet 0  . potassium chloride (K-DUR) 10 MEQ tablet Take 1 tablet (10 mEq total) by mouth daily. 30 tablet 1  . topiramate (TOPAMAX) 100 MG tablet Take 1 tablet by mouth twice daily 60 tablet 0  . zolpidem (AMBIEN) 5 MG tablet TAKE 1 TABLET BY MOUTH AT BEDTIME AS NEEDED FOR SLEEP 30 tablet 5  . hydrochlorothiazide (HYDRODIURIL) 25 MG tablet Take 25 mg by mouth daily.     No facility-administered medications prior to visit.    Allergies  Allergen Reactions  . Gabapentin     Dizziness and blurred vision    Review of Systems  Constitutional: Negative.   HENT: Negative.   Eyes: Negative.   Respiratory: Negative.   Gastrointestinal: Negative.   Genitourinary: Negative.   Musculoskeletal: Positive for arthralgias, arthritis, back pain, gait problem, myalgias and stiffness.       Objective:    Physical Exam Constitutional:      General: She is not in acute distress.    Appearance: Normal appearance. She is well-developed.  HENT:     Head: Normocephalic and atraumatic.  Cardiovascular:     Rate and Rhythm: Normal  rate.  Pulmonary:     Effort: Pulmonary effort is normal.  Musculoskeletal:     Lumbar back: Deformity, spasms and tenderness present. Decreased range of motion.       Back:  Skin:    General: Skin is warm and dry.     Findings: No rash.  Neurological:     Mental Status: She is alert and oriented to person, place, and time.     Deep Tendon Reflexes: Reflexes are normal and symmetric.     BP 130/79   Pulse 80   Temp 98.4 F (36.9 C) (Temporal)   Ht 5' 6"  (1.676 m)   Wt 157 lb 6.4 oz (71.4 kg)   SpO2 97%   BMI 25.41 kg/m  Wt Readings from Last 3 Encounters:  07/01/19 157 lb 6.4 oz (71.4 kg)  03/24/19 148 lb 9.6 oz (67.4 kg)  01/20/19 147 lb (66.7 kg)    Health Maintenance Due  Topic Date Due  . Hepatitis C Screening  [birth year], 1954  . HIV Screening  11/19/1969  . PAP SMEAR-Modifier  11/20/1975  . COLONOSCOPY  11/19/2004    There are no preventive care reminders to display for this patient.   Lab Results  Component Value Date   TSH 4.310 07/26/2018   Lab Results  Component Value Date   WBC 9.1 07/26/2018   HGB 14.1 07/26/2018   HCT 40.9 07/26/2018   MCV 92 07/26/2018   PLT 357 07/26/2018   Lab Results  Component Value Date   NA 136 07/26/2018   K 3.1 (L) 07/26/2018   CO2 29 07/26/2018   GLUCOSE 134 (H) 07/26/2018   BUN 10 07/26/2018   CREATININE 0.99 07/26/2018   BILITOT 0.4 07/26/2018   ALKPHOS 113 07/26/2018   AST 19 07/26/2018   ALT 20 07/26/2018   PROT 6.7 07/26/2018   ALBUMIN 4.4 07/26/2018   CALCIUM 9.7 07/26/2018   ANIONGAP 10 04/12/2018   Lab Results  Component Value Date   CHOL 196 07/26/2018   Lab Results  Component  Value Date   HDL 52 07/26/2018   Lab Results  Component Value Date   LDLCALC 101 (H) 07/26/2018   Lab Results  Component Value Date   TRIG 217 (H) 07/26/2018   Lab Results  Component Value Date   CHOLHDL 3.8 07/26/2018   Lab Results  Component Value Date   HGBA1C 6.3 07/26/2018       Assessment & Plan:    Problem List Items Addressed This Visit      Cardiovascular and Mediastinum   Migraine   Relevant Medications   oxyCODONE-acetaminophen (PERCOCET) 10-325 MG tablet   oxyCODONE-acetaminophen (PERCOCET) 10-325 MG tablet   oxyCODONE-acetaminophen (PERCOCET) 10-325 MG tablet   amitriptyline (ELAVIL) 100 MG tablet   topiramate (TOPAMAX) 100 MG tablet     Nervous and Auditory   Osteoarthritis of spine with radiculopathy, lumbosacral region   Relevant Medications   oxyCODONE-acetaminophen (PERCOCET) 10-325 MG tablet   oxyCODONE-acetaminophen (PERCOCET) 10-325 MG tablet   oxyCODONE-acetaminophen (PERCOCET) 10-325 MG tablet   amitriptyline (ELAVIL) 100 MG tablet   topiramate (TOPAMAX) 100 MG tablet   zolpidem (AMBIEN) 5 MG tablet   Other Relevant Orders   ToxASSURE Select 13 (MW), Urine (Completed)     Musculoskeletal and Integument   DDD (degenerative disc disease), lumbar   Relevant Medications   oxyCODONE-acetaminophen (PERCOCET) 10-325 MG tablet   oxyCODONE-acetaminophen (PERCOCET) 10-325 MG tablet   oxyCODONE-acetaminophen (PERCOCET) 10-325 MG tablet   amitriptyline (ELAVIL) 100 MG tablet   Other Relevant Orders   ToxASSURE Select 13 (MW), Urine (Completed)   Compression fracture of body of thoracic vertebra (HCC)   Relevant Medications   oxyCODONE-acetaminophen (PERCOCET) 10-325 MG tablet   oxyCODONE-acetaminophen (PERCOCET) 10-325 MG tablet   oxyCODONE-acetaminophen (PERCOCET) 10-325 MG tablet   Other Relevant Orders   ToxASSURE Select 13 (MW), Urine (Completed)       Meds ordered this encounter  Medications  . oxyCODONE-acetaminophen (PERCOCET) 10-325 MG tablet    Sig: Take 1 tablet by mouth every 6 (six) hours as needed for pain.    Dispense:  120 tablet    Refill:  0    Fill 06/27/2019    Order Specific Question:   Supervising Provider    Answer:   Janora Norlander [[id number]]  . oxyCODONE-acetaminophen (PERCOCET) 10-325 MG tablet    Sig: Take 1 tablet by  mouth every 8 (eight) hours as needed for pain.    Dispense:  120 tablet    Refill:  0    Fill 05/28/2019    Order Specific Question:   Supervising Provider    Answer:   Janora Norlander [[id number]]  . oxyCODONE-acetaminophen (PERCOCET) 10-325 MG tablet    Sig: Take 1 tablet by mouth every 6 (six) hours as needed for pain.    Dispense:  120 tablet    Refill:  0    Fill 04/29/19    Order Specific Question:   Supervising Provider    Answer:   Janora Norlander [[id number]]  . amitriptyline (ELAVIL) 100 MG tablet    Sig: Take 1 tablet (100 mg total) by mouth at bedtime.    Dispense:  90 tablet    Refill:  3    Order Specific Question:   Supervising Provider    Answer:   Janora Norlander [[id number]]  . topiramate (TOPAMAX) 100 MG tablet    Sig: Take 1 tablet (100 mg total) by mouth 2 (two) times daily.    Dispense:  60 tablet    Refill:  11  Order Specific Question:   Supervising Provider    Answer:   Janora Norlander [[id number]]  . zolpidem (AMBIEN) 5 MG tablet    Sig: Take 1 tablet (5 mg total) by mouth at bedtime as needed. for sleep    Dispense:  30 tablet    Refill:  5    Order Specific Question:   Supervising Provider    Answer:   Janora Norlander [[id number]]     Terald Sleeper, PA-C

## 2019-07-22 DIAGNOSIS — Z23 Encounter for immunization: Secondary | ICD-10-CM | POA: Diagnosis not present

## 2019-08-20 DIAGNOSIS — L821 Other seborrheic keratosis: Secondary | ICD-10-CM | POA: Diagnosis not present

## 2019-08-20 DIAGNOSIS — L814 Other melanin hyperpigmentation: Secondary | ICD-10-CM | POA: Diagnosis not present

## 2019-08-20 DIAGNOSIS — Z85828 Personal history of other malignant neoplasm of skin: Secondary | ICD-10-CM | POA: Diagnosis not present

## 2019-08-20 DIAGNOSIS — L570 Actinic keratosis: Secondary | ICD-10-CM | POA: Diagnosis not present

## 2019-08-20 DIAGNOSIS — Z86007 Personal history of in-situ neoplasm of skin: Secondary | ICD-10-CM | POA: Diagnosis not present

## 2019-08-20 DIAGNOSIS — L578 Other skin changes due to chronic exposure to nonionizing radiation: Secondary | ICD-10-CM | POA: Diagnosis not present

## 2019-08-20 DIAGNOSIS — D0461 Carcinoma in situ of skin of right upper limb, including shoulder: Secondary | ICD-10-CM | POA: Diagnosis not present

## 2019-08-20 DIAGNOSIS — D045 Carcinoma in situ of skin of trunk: Secondary | ICD-10-CM | POA: Diagnosis not present

## 2019-08-20 DIAGNOSIS — I872 Venous insufficiency (chronic) (peripheral): Secondary | ICD-10-CM | POA: Diagnosis not present

## 2019-08-20 DIAGNOSIS — D485 Neoplasm of uncertain behavior of skin: Secondary | ICD-10-CM | POA: Diagnosis not present

## 2019-08-20 DIAGNOSIS — C44722 Squamous cell carcinoma of skin of right lower limb, including hip: Secondary | ICD-10-CM | POA: Diagnosis not present

## 2019-09-12 DIAGNOSIS — R7303 Prediabetes: Secondary | ICD-10-CM | POA: Diagnosis not present

## 2019-09-12 DIAGNOSIS — E785 Hyperlipidemia, unspecified: Secondary | ICD-10-CM | POA: Diagnosis not present

## 2019-09-12 DIAGNOSIS — I25111 Atherosclerotic heart disease of native coronary artery with angina pectoris with documented spasm: Secondary | ICD-10-CM | POA: Diagnosis not present

## 2019-09-12 DIAGNOSIS — D472 Monoclonal gammopathy: Secondary | ICD-10-CM | POA: Diagnosis not present

## 2019-09-12 DIAGNOSIS — M1990 Unspecified osteoarthritis, unspecified site: Secondary | ICD-10-CM | POA: Diagnosis not present

## 2019-09-12 DIAGNOSIS — I10 Essential (primary) hypertension: Secondary | ICD-10-CM | POA: Diagnosis not present

## 2019-09-18 DIAGNOSIS — D0461 Carcinoma in situ of skin of right upper limb, including shoulder: Secondary | ICD-10-CM | POA: Diagnosis not present

## 2019-09-18 DIAGNOSIS — D045 Carcinoma in situ of skin of trunk: Secondary | ICD-10-CM | POA: Diagnosis not present

## 2019-09-18 DIAGNOSIS — D485 Neoplasm of uncertain behavior of skin: Secondary | ICD-10-CM | POA: Diagnosis not present

## 2019-09-18 DIAGNOSIS — L570 Actinic keratosis: Secondary | ICD-10-CM | POA: Diagnosis not present

## 2019-09-25 DIAGNOSIS — C44722 Squamous cell carcinoma of skin of right lower limb, including hip: Secondary | ICD-10-CM | POA: Diagnosis not present

## 2019-09-25 HISTORY — PX: OTHER SURGICAL HISTORY: SHX169

## 2019-09-26 ENCOUNTER — Other Ambulatory Visit: Payer: Self-pay

## 2019-09-26 ENCOUNTER — Encounter: Payer: Self-pay | Admitting: Family Medicine

## 2019-09-26 ENCOUNTER — Ambulatory Visit (INDEPENDENT_AMBULATORY_CARE_PROVIDER_SITE_OTHER): Payer: Medicare HMO | Admitting: Family Medicine

## 2019-09-26 VITALS — BP 119/67 | HR 91 | Temp 97.9°F | Ht 66.0 in | Wt 164.2 lb

## 2019-09-26 DIAGNOSIS — S22000D Wedge compression fracture of unspecified thoracic vertebra, subsequent encounter for fracture with routine healing: Secondary | ICD-10-CM | POA: Diagnosis not present

## 2019-09-26 DIAGNOSIS — Z114 Encounter for screening for human immunodeficiency virus [HIV]: Secondary | ICD-10-CM

## 2019-09-26 DIAGNOSIS — M5136 Other intervertebral disc degeneration, lumbar region: Secondary | ICD-10-CM | POA: Diagnosis not present

## 2019-09-26 DIAGNOSIS — Z1159 Encounter for screening for other viral diseases: Secondary | ICD-10-CM | POA: Diagnosis not present

## 2019-09-26 DIAGNOSIS — E876 Hypokalemia: Secondary | ICD-10-CM

## 2019-09-26 DIAGNOSIS — Z13820 Encounter for screening for osteoporosis: Secondary | ICD-10-CM | POA: Diagnosis not present

## 2019-09-26 DIAGNOSIS — S22000A Wedge compression fracture of unspecified thoracic vertebra, initial encounter for closed fracture: Secondary | ICD-10-CM

## 2019-09-26 DIAGNOSIS — F5101 Primary insomnia: Secondary | ICD-10-CM

## 2019-09-26 DIAGNOSIS — Z7689 Persons encountering health services in other specified circumstances: Secondary | ICD-10-CM

## 2019-09-26 DIAGNOSIS — Z79899 Other long term (current) drug therapy: Secondary | ICD-10-CM

## 2019-09-26 DIAGNOSIS — M4727 Other spondylosis with radiculopathy, lumbosacral region: Secondary | ICD-10-CM

## 2019-09-26 DIAGNOSIS — R69 Illness, unspecified: Secondary | ICD-10-CM | POA: Diagnosis not present

## 2019-09-26 MED ORDER — OXYCODONE-ACETAMINOPHEN 10-325 MG PO TABS: 1.0000 | ORAL_TABLET | Freq: Three times a day (TID) | ORAL | 0 refills | Status: DC | PRN

## 2019-09-26 MED ORDER — TOPIRAMATE 50 MG PO TABS: 50.0000 mg | ORAL_TABLET | Freq: Every day | ORAL | 5 refills | Status: DC

## 2019-09-26 MED ORDER — ZOLPIDEM TARTRATE 5 MG PO TABS: 5.0000 mg | ORAL_TABLET | Freq: Every evening | ORAL | 5 refills | Status: DC | PRN

## 2019-09-26 NOTE — Patient Instructions (Signed)
   Controlled Substance Guidelines:  1. You cannot get an early refill, even it is lost.  2. You cannot get controlled medications from any other doctor, unless it is the emergency department and related to a new problem or injury.  3. You cannot use alcohol, marijuana, cocaine or any other recreational drugs while using this medication. This is very dangerous.  4. You are willing to have your urine drug tested at each visit.  5. You will not drive while using this medication, because that can put yourself and others in serious danger of an accident. 6. If any medication is stolen, then there must be a police report to verify it, or it cannot be refilled.  7. I will not prescribe these medications for longer than 3 months.  8. You must bring your pill bottle to each visit.  9. You must use the same pharmacy for all refills for the medication, unless you clear it with me beforehand.  10. You cannot share or sell this medication.   

## 2019-09-26 NOTE — Progress Notes (Signed)
Subjective: CC: Establish care, chronic pain, DDD, insomnia PCP: Janora Norlander, DO GYF:VCBSW B Rodgers is a 64 y.o. female presenting to clinic today for:  1.  Chronic pain Patient reports chronic pain in the low back.  She has history of fusion of the lumbar spine as well as fusion of the C-spine in 2 segments.  These were done by Dr. Joya Salm.  She continues to follow-up with the spine and scoliosis clinic and had gone through a series of corticosteroid injections to the back which helped for about 1 to 2 weeks but then pain returned.  Pain typically radiates down the left lower extremity.  She does sometimes have instability of the left lower extremity.  She was determined not to be a good surgical candidate for rod placement in the back which would help straighten her back.  She recently sustained a compression fracture to the thoracic vertebra.  Uncertain etiology as she did not have any preceding injury or fall.  She has not had a bone density scan but would be very interested in getting 1 of these done.  She takes chronic Percocet 10-'3 25 3 ' times daily.  This was been a wean from OxyContin 60 mg twice daily.  She has tolerated the wean quite well and has felt that the pain has been stable on the decreased dose of oxycodone.  Denies any constipation with reduced dose.  She has had no excessive daytime sedation, falls, mental status changes.  Last dose was around 2 PM.  She also takes Topamax for some radicular symptoms.  She is not even sure if this helps I would be interested in tapering off.  She has tried gabapentin in the past but this resulted in blurred vision so it was discontinued.  Not a good candidate for NSAIDs given cardiac history.  2.  Insomnia Patient reports chronic insomnia such that she takes Ambien 5 mg nightly with good control of sleep.  Most of her sleep issues use is from the above.  Denies any hallucinations, sleepwalking, daytime sleepiness, falls or mental status  changes.   ROS: Per HPI  Allergies  Allergen Reactions  . Gabapentin     Dizziness and blurred vision   Past Medical History:  Diagnosis Date  . Anxiety   . Arthritis   . GERD (gastroesophageal reflux disease)   . Hyperlipidemia   . Hypertension     Current Outpatient Medications:  .  amitriptyline (ELAVIL) 100 MG tablet, Take 1 tablet (100 mg total) by mouth at bedtime., Disp: 90 tablet, Rfl: 3 .  aspirin 81 MG chewable tablet, Chew 81 mg by mouth daily., Disp: , Rfl:  .  atenolol (TENORMIN) 25 MG tablet, Take 25 mg by mouth daily. , Disp: , Rfl: 2 .  cholecalciferol (VITAMIN D3) 25 MCG (1000 UT) tablet, Take 1,000 Units by mouth daily., Disp: , Rfl:  .  isosorbide mononitrate (IMDUR) 60 MG 24 hr tablet, Take 60 mg by mouth daily., Disp: , Rfl:  .  NARCAN 4 MG/0.1ML LIQD nasal spray kit, ADMINISTER A SINGLE SPRAY IN ONE NOSTRIL UPON SIGNS OF OPIOID OVERDOSE. CALL 911. REPEAT AFTER 3 MINUTES IF NO RESPONSE., Disp: , Rfl:  .  nitroGLYCERIN (NITROSTAT) 0.4 MG SL tablet, See admin instructions., Disp: , Rfl:  .  oxyCODONE-acetaminophen (PERCOCET) 10-325 MG tablet, Take 1 tablet by mouth every 6 (six) hours as needed for pain., Disp: 120 tablet, Rfl: 0 .  oxyCODONE-acetaminophen (PERCOCET) 10-325 MG tablet, Take 1 tablet by mouth  every 8 (eight) hours as needed for pain., Disp: 120 tablet, Rfl: 0 .  oxyCODONE-acetaminophen (PERCOCET) 10-325 MG tablet, Take 1 tablet by mouth every 6 (six) hours as needed for pain., Disp: 120 tablet, Rfl: 0 .  rosuvastatin (CRESTOR) 5 MG tablet, Take 5 mg by mouth daily., Disp: , Rfl:  .  topiramate (TOPAMAX) 100 MG tablet, Take 1 tablet (100 mg total) by mouth 2 (two) times daily., Disp: 60 tablet, Rfl: 11 .  vitamin B-12 (CYANOCOBALAMIN) 100 MCG tablet, Take 100 mcg by mouth daily., Disp: , Rfl:  .  vitamin C (ASCORBIC ACID) 500 MG tablet, Take 500 mg by mouth daily., Disp: , Rfl:  .  zolpidem (AMBIEN) 5 MG tablet, Take 1 tablet (5 mg total) by mouth at  bedtime as needed. for sleep, Disp: 30 tablet, Rfl: 5 Social History   Socioeconomic History  . Marital status: Married    Spouse name: Not on file  . Number of children: Not on file  . Years of education: Not on file  . Highest education level: Not on file  Occupational History  . Not on file  Tobacco Use  . Smoking status: Never Smoker  . Smokeless tobacco: Never Used  Substance and Sexual Activity  . Alcohol use: No  . Drug use: No  . Sexual activity: Not on file  Other Topics Concern  . Not on file  Social History Narrative  . Not on file   Social Determinants of Health   Financial Resource Strain:   . Difficulty of Paying Living Expenses:   Food Insecurity:   . Worried About Charity fundraiser in the Last Year:   . Arboriculturist in the Last Year:   Transportation Needs:   . Film/video editor (Medical):   Marland Kitchen Lack of Transportation (Non-Medical):   Physical Activity:   . Days of Exercise per Week:   . Minutes of Exercise per Session:   Stress:   . Feeling of Stress :   Social Connections:   . Frequency of Communication with Friends and Family:   . Frequency of Social Gatherings with Friends and Family:   . Attends Religious Services:   . Active Member of Clubs or Organizations:   . Attends Archivist Meetings:   Marland Kitchen Marital Status:   Intimate Partner Violence:   . Fear of Current or Ex-Partner:   . Emotionally Abused:   Marland Kitchen Physically Abused:   . Sexually Abused:    Family History  Problem Relation Age of Onset  . Congestive Heart Failure Mother   . Arthritis Mother   . Cancer Father        lung  . Arthritis/Rheumatoid Maternal Grandmother   . Cancer Paternal Grandfather        skin    Objective: Office vital signs reviewed. BP 119/67   Pulse 91   Temp 97.9 F (36.6 C)   Ht '5\' 6"'  (1.676 m)   Wt 164 lb 3.2 oz (74.5 kg)   SpO2 99%   BMI 26.50 kg/m   Physical Examination:  General: Awake, alert, well nourished, No acute  distress HEENT: Normal, sclera white, MMM Cardio: regular rate and rhythm, S1S2 heard, no murmurs appreciated Pulm: clear to auscultation bilaterally, no wheezes, rhonchi or rales; normal work of breathing on room air Extremities: warm, well perfused, No edema, cyanosis or clubbing; +2 pulses bilaterally MSK: antlagic gait and abnormal station; right hip higher than left. ?scoliotic curve. Ambulates with cane.  Thoracic: pronounced kyphotic curve  Lumbar: flattening of lumbar spine. Post surgical changes noted.   Skin: dry; intact; no rashes or lesions Psych: mood stable. Speech normal.  Affect appropriate Depression screen [facility] 2/9 09/26/2019 07/01/2019 03/24/2019  Decreased Interest 0 0 0  Down, Depressed, Hopeless 0 0 0  PHQ - 2 Score 0 0 0  Altered sleeping - 1 -  Tired, decreased energy - 1 -  Change in appetite - 0 -  Feeling bad or failure about yourself  - 0 -  Trouble concentrating - 0 -  Moving slowly or fidgety/restless - 0 -  Suicidal thoughts - 0 -  PHQ-9 Score - 2 -   Assessment/ Plan: 64 y.o. female   1. DDD (degenerative disc disease), lumbar I reviewed her chart.  It sounds like she really has exhausted all options.  She has done really well with weaning down to 70 MME's.  I am willing to continue this medication but would like her to keep her spine doctor on hand if needed going forward.  Medications have been renewed and asked to place on file since she recently got these within the last week.  She will follow-up in 3 months, sooner if needed.  UDS and controlled substance contract updated. - ToxASSURE Select 13 (MW), Urine - oxyCODONE-acetaminophen (PERCOCET) 10-325 MG tablet; Take 1 tablet by mouth every 8 (eight) hours as needed for pain.  Dispense: 90 tablet; Refill: 0 - oxyCODONE-acetaminophen (PERCOCET) 10-325 MG tablet; Take 1 tablet by mouth every 8 (eight) hours as needed for pain.  Dispense: 90 tablet; Refill: 0 - oxyCODONE-acetaminophen (PERCOCET) 10-325 MG  tablet; Take 1 tablet by mouth every 8 (eight) hours as needed for pain.  Dispense: 90 tablet; Refill: 0  2. Osteoarthritis of spine with radiculopathy, lumbosacral region - ToxASSURE Select 13 (MW), Urine - oxyCODONE-acetaminophen (PERCOCET) 10-325 MG tablet; Take 1 tablet by mouth every 8 (eight) hours as needed for pain.  Dispense: 90 tablet; Refill: 0 - oxyCODONE-acetaminophen (PERCOCET) 10-325 MG tablet; Take 1 tablet by mouth every 8 (eight) hours as needed for pain.  Dispense: 90 tablet; Refill: 0 - oxyCODONE-acetaminophen (PERCOCET) 10-325 MG tablet; Take 1 tablet by mouth every 8 (eight) hours as needed for pain.  Dispense: 90 tablet; Refill: 0  3. Compression fracture of body of thoracic vertebra (HCC) I question a possible osteoporotic fracture.  It does not appear that she is had a DEXA scan but she is very willing to pursue this.  I will see if Lenna Sciara can arrange for further evaluation. - ToxASSURE Select 13 (MW), Urine - oxyCODONE-acetaminophen (PERCOCET) 10-325 MG tablet; Take 1 tablet by mouth every 8 (eight) hours as needed for pain.  Dispense: 90 tablet; Refill: 0 - oxyCODONE-acetaminophen (PERCOCET) 10-325 MG tablet; Take 1 tablet by mouth every 8 (eight) hours as needed for pain.  Dispense: 90 tablet; Refill: 0 - oxyCODONE-acetaminophen (PERCOCET) 10-325 MG tablet; Take 1 tablet by mouth every 8 (eight) hours as needed for pain.  Dispense: 90 tablet; Refill: 0  4. Controlled substance agreement signed - ToxASSURE Select 13 (MW), Urine  5. Hypokalemia - Basic Metabolic Panel - Magnesium  6. Establishing care with new doctor, encounter for  7. Need for hepatitis C screening test - Hepatitis C antibody  8. Screening for HIV (human immunodeficiency virus) - HIV Antibody (routine testing w rflx)  9. Primary insomnia Stable.  National narcotic database was reviewed no red flags.  Cancel previous Rx by previous PCP.  Ambien sent in. -  zolpidem (AMBIEN) 5 MG tablet; Take  1 tablet (5 mg total) by mouth at bedtime as needed for sleep.  Dispense: 30 tablet; Refill: 5   No orders of the defined types were placed in this encounter.  No orders of the defined types were placed in this encounter.    Janora Norlander, DO [place] [phone]

## 2019-09-27 LAB — HIV ANTIBODY (ROUTINE TESTING W REFLEX): HIV Screen 4th Generation wRfx: NONREACTIVE

## 2019-09-27 LAB — BASIC METABOLIC PANEL
BUN/Creatinine Ratio: 13 (ref 12–28)
BUN: 12 mg/dL (ref 8–27)
CO2: 24 mmol/L (ref 20–29)
Calcium: 9.2 mg/dL (ref 8.7–10.3)
Chloride: 104 mmol/L (ref 96–106)
Creatinine, Ser: 0.92 mg/dL (ref 0.57–1.00)
GFR calc Af Amer: 76 mL/min/{1.73_m2} (ref >59)
GFR calc non Af Amer: 66 mL/min/{1.73_m2} (ref >59)
Glucose: 110 mg/dL — ABNORMAL HIGH (ref 65–99)
Potassium: 4.5 mmol/L (ref 3.5–5.2)
Sodium: 141 mmol/L (ref 134–144)

## 2019-09-27 LAB — HEPATITIS C ANTIBODY: Hep C Virus Ab: <0.1 s/co ratio (ref 0.0–0.9)

## 2019-09-27 LAB — MAGNESIUM: Magnesium: 2.1 mg/dL (ref 1.6–2.3)

## 2019-09-29 NOTE — Progress Notes (Signed)
Appt made for 10/01/19

## 2019-09-30 ENCOUNTER — Ambulatory Visit: Payer: Medicare HMO | Admitting: Physician Assistant

## 2019-09-30 LAB — TOXASSURE SELECT 13 (MW), URINE

## 2019-10-01 ENCOUNTER — Other Ambulatory Visit: Payer: Medicare HMO

## 2019-10-05 IMAGING — DX DG CHEST 2V
2 series · 2 of 2 positions shown · non-contrast
Comparison: 01/10/2018.

CLINICAL DATA: Community acquired pneumonia.

EXAM:
CHEST - 2 VIEW

[chest pa]
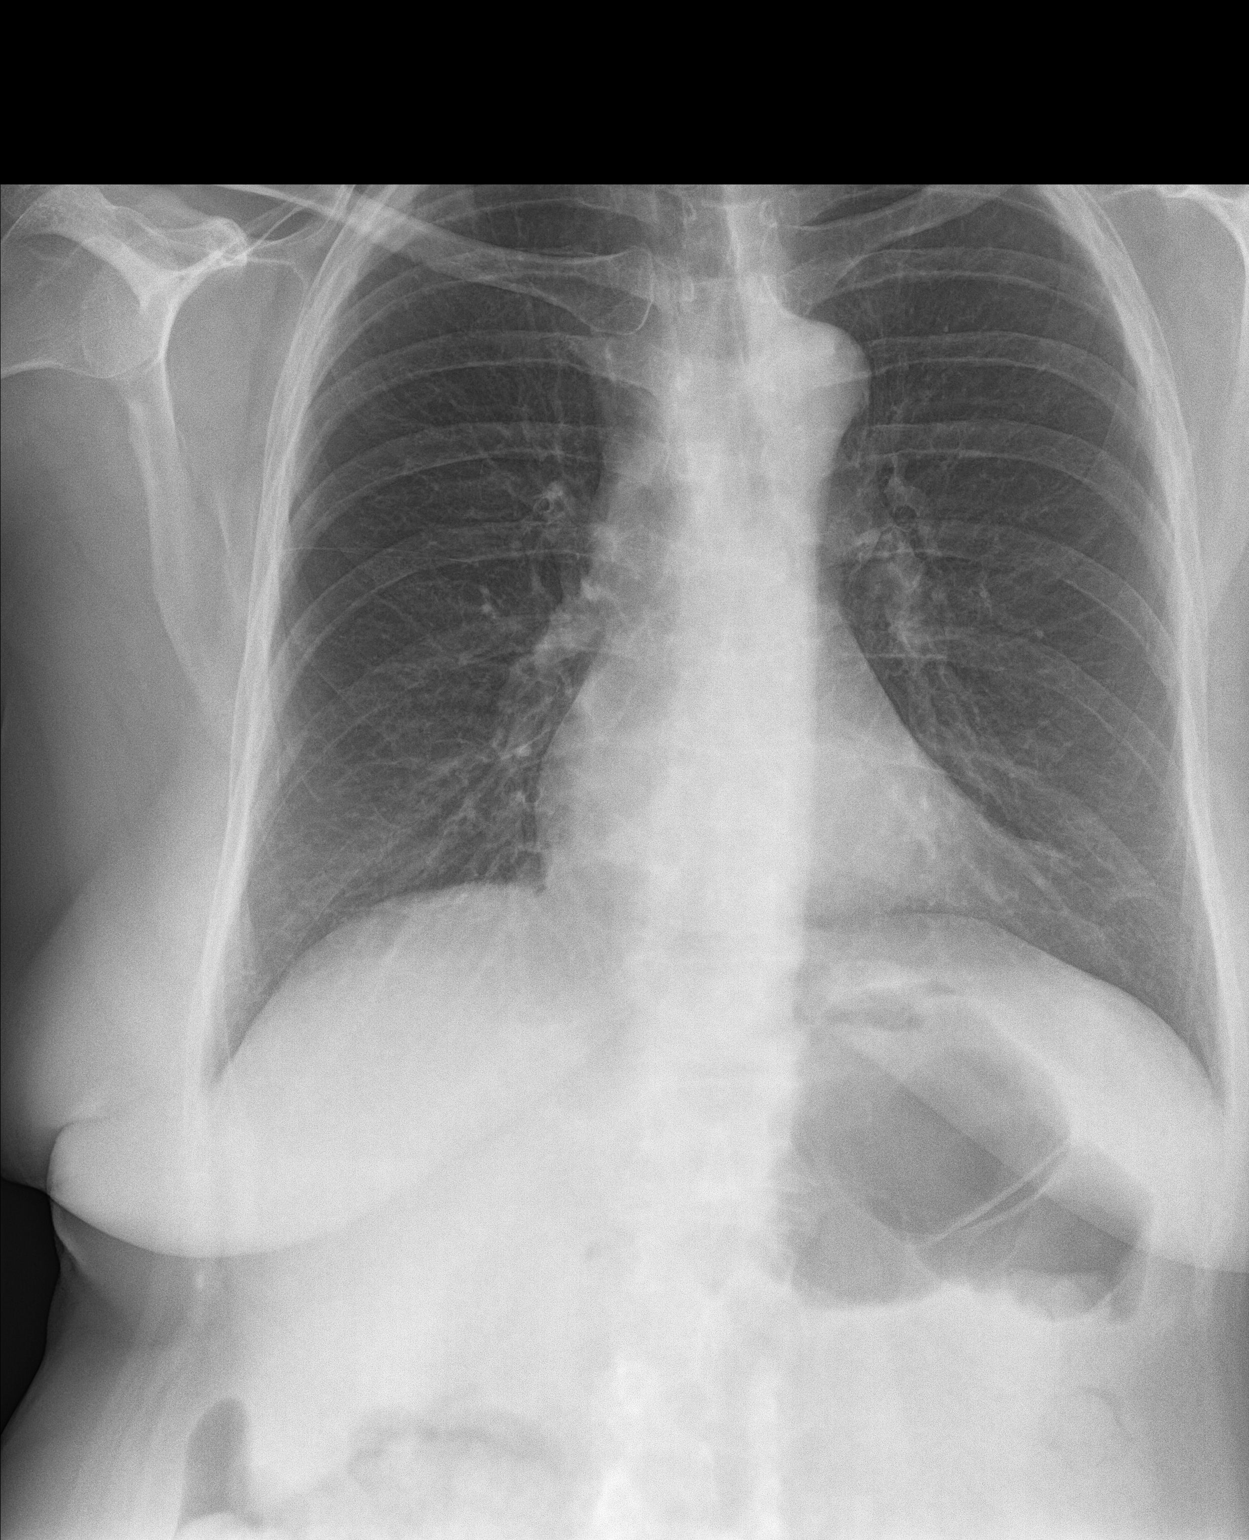

[chest lat]
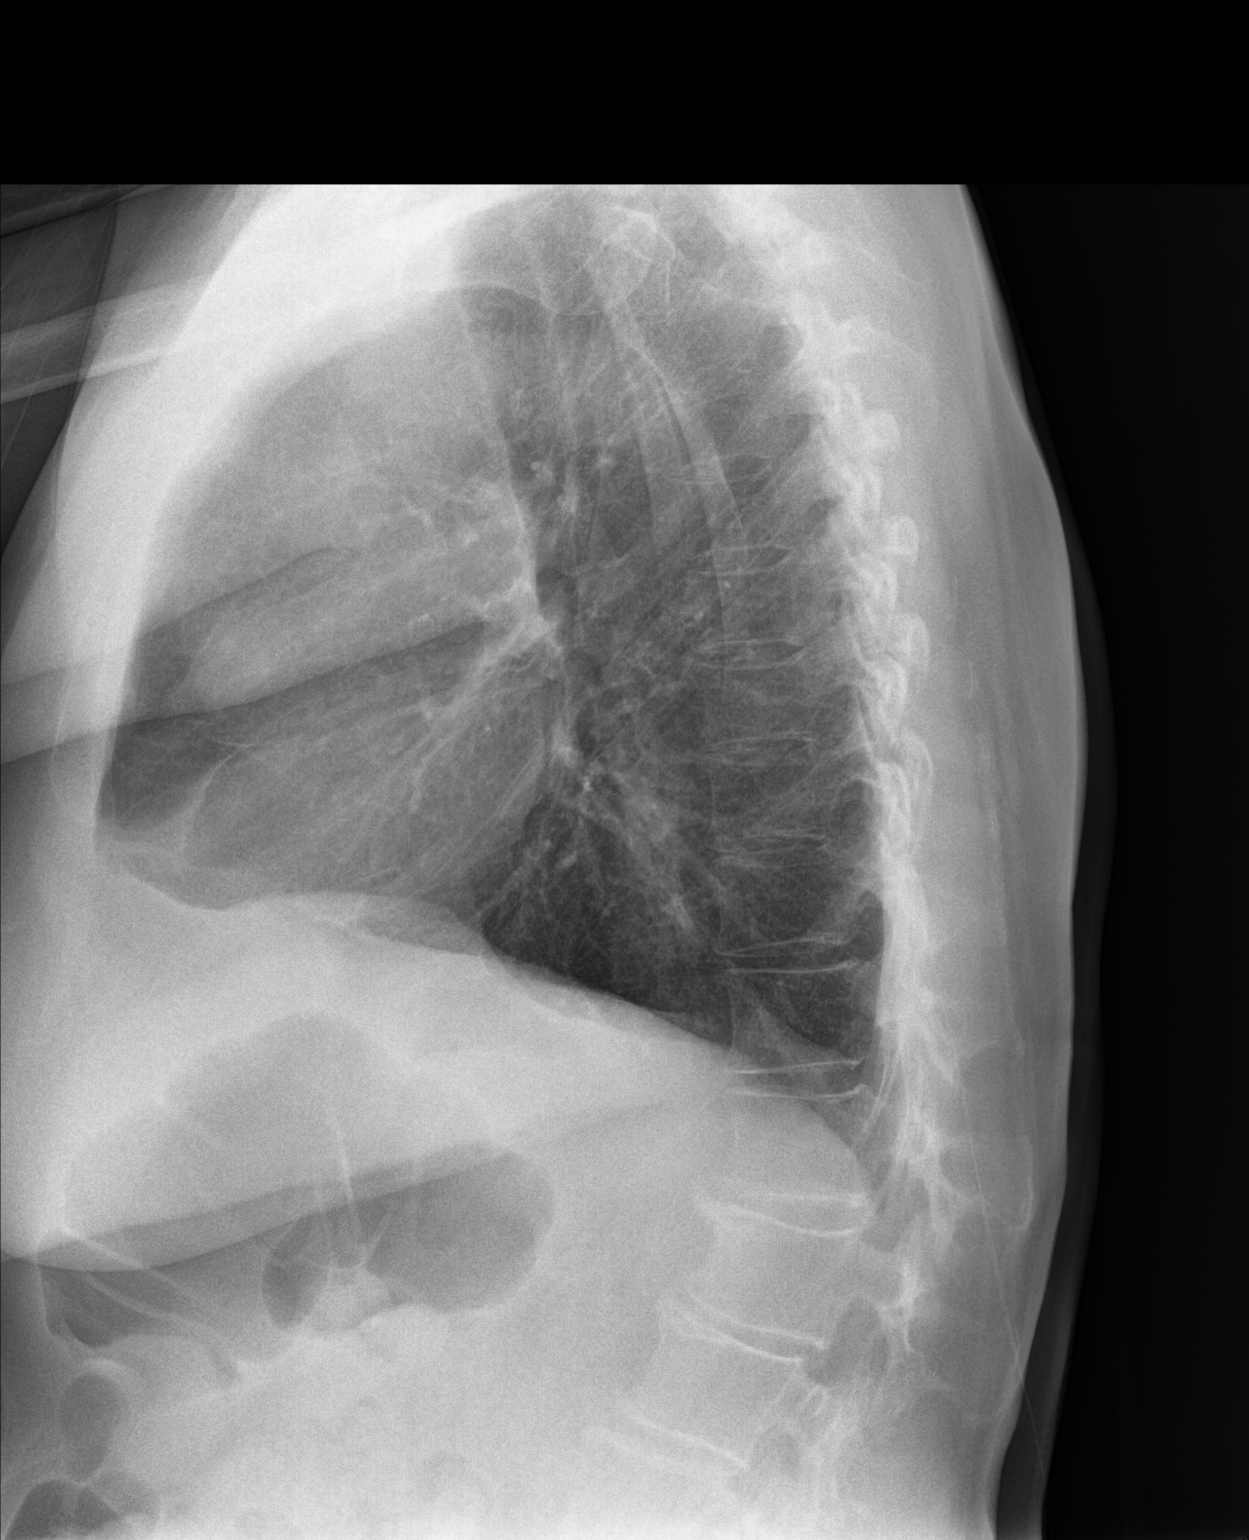

[2 of 2 positions shown; findings below may reference images not displayed]

FINDINGS: Mediastinum and hilar structures normal. Interim near complete
clearing of left base atelectasis. No pleural effusion or
pneumothorax. Heart size normal. No acute bony abnormality. Diffuse
osteopenia degenerative change thoracolumbar spine.
IMPRESSION: Interim near complete clearing of left base atelectasis.

## 2019-10-09 DIAGNOSIS — D099 Carcinoma in situ, unspecified: Secondary | ICD-10-CM | POA: Diagnosis not present

## 2019-10-09 DIAGNOSIS — D0461 Carcinoma in situ of skin of right upper limb, including shoulder: Secondary | ICD-10-CM | POA: Diagnosis not present

## 2019-10-09 DIAGNOSIS — D485 Neoplasm of uncertain behavior of skin: Secondary | ICD-10-CM | POA: Diagnosis not present

## 2019-12-12 DIAGNOSIS — I25111 Atherosclerotic heart disease of native coronary artery with angina pectoris with documented spasm: Secondary | ICD-10-CM | POA: Diagnosis not present

## 2019-12-12 DIAGNOSIS — M1990 Unspecified osteoarthritis, unspecified site: Secondary | ICD-10-CM | POA: Diagnosis not present

## 2019-12-12 DIAGNOSIS — R7303 Prediabetes: Secondary | ICD-10-CM | POA: Diagnosis not present

## 2019-12-12 DIAGNOSIS — E785 Hyperlipidemia, unspecified: Secondary | ICD-10-CM | POA: Diagnosis not present

## 2019-12-12 DIAGNOSIS — I10 Essential (primary) hypertension: Secondary | ICD-10-CM | POA: Diagnosis not present

## 2019-12-29 ENCOUNTER — Other Ambulatory Visit: Payer: Self-pay

## 2019-12-29 ENCOUNTER — Encounter: Payer: Self-pay | Admitting: Family Medicine

## 2019-12-29 ENCOUNTER — Ambulatory Visit (INDEPENDENT_AMBULATORY_CARE_PROVIDER_SITE_OTHER): Payer: Medicare HMO | Admitting: Family Medicine

## 2019-12-29 VITALS — BP 125/78 | HR 89 | Temp 97.9°F | Ht 66.0 in | Wt 168.0 lb

## 2019-12-29 DIAGNOSIS — R69 Illness, unspecified: Secondary | ICD-10-CM | POA: Diagnosis not present

## 2019-12-29 DIAGNOSIS — S22000A Wedge compression fracture of unspecified thoracic vertebra, initial encounter for closed fracture: Secondary | ICD-10-CM

## 2019-12-29 DIAGNOSIS — S22000D Wedge compression fracture of unspecified thoracic vertebra, subsequent encounter for fracture with routine healing: Secondary | ICD-10-CM | POA: Diagnosis not present

## 2019-12-29 DIAGNOSIS — M5136 Other intervertebral disc degeneration, lumbar region: Secondary | ICD-10-CM | POA: Diagnosis not present

## 2019-12-29 DIAGNOSIS — F1190 Opioid use, unspecified, uncomplicated: Secondary | ICD-10-CM

## 2019-12-29 DIAGNOSIS — M4727 Other spondylosis with radiculopathy, lumbosacral region: Secondary | ICD-10-CM

## 2019-12-29 MED ORDER — OXYCODONE-ACETAMINOPHEN 10-325 MG PO TABS: 1.0000 | ORAL_TABLET | Freq: Three times a day (TID) | ORAL | 0 refills | Status: DC | PRN

## 2019-12-29 NOTE — Progress Notes (Signed)
Subjective: CC: Establish care, chronic pain, DDD, insomnia PCP: Janora Norlander, DO QMV:HQION [name] is a 64 y.o. female presenting to clinic today for:  1.  Chronic pain History: She has history of fusion of the lumbar spine as well as fusion of the C-spine in 2 segments.  These were done by Dr. Joya Salm. Previous failed therapies/intolerances: Gabapentin (blurred vision); NSAIDS (cardiac history); corticosteroid injections to back (lasted only 2 weeks).  She is here for interval follow up on chronic pain.  She is prescribed percocet 10/353m TID (wean from Oxycontin 687mBID).   Pain is fairly well controlled.  Today pain level is 5/10.  Pain maximum tends to be around 8/10 if she is not taking her medication regularly.  Denies any constipation, falls.  She continues to stay mobile with this regimen.  She is going to schedule follow-up with her back doctor soon.   ROS: Per HPI  Allergies  Allergen Reactions  . Gabapentin     Dizziness and blurred vision   Past Medical History:  Diagnosis Date  . Anxiety   . Arthritis   . GERD (gastroesophageal reflux disease)   . Hyperlipidemia   . Hypertension     Current Outpatient Medications:  .  amitriptyline (ELAVIL) 100 MG tablet, Take 1 tablet (100 mg total) by mouth at bedtime., Disp: 90 tablet, Rfl: 3 .  aspirin 81 MG chewable tablet, Chew 81 mg by mouth daily., Disp: , Rfl:  .  atenolol (TENORMIN) 25 MG tablet, Take 25 mg by mouth daily. , Disp: , Rfl: 2 .  cholecalciferol (VITAMIN D3) 25 MCG (1000 UT) tablet, Take 1,000 Units by mouth daily., Disp: , Rfl:  .  hydrochlorothiazide (HYDRODIURIL) 25 MG tablet, Take 12.5 mg by mouth every morning., Disp: , Rfl:  .  isosorbide mononitrate (IMDUR) 60 MG 24 hr tablet, Take 60 mg by mouth daily., Disp: , Rfl:  .  NARCAN 4 MG/0.1ML LIQD nasal spray kit, ADMINISTER A SINGLE SPRAY IN ONE NOSTRIL UPON SIGNS OF OPIOID OVERDOSE. CALL 911. REPEAT AFTER 3 MINUTES IF NO RESPONSE., Disp: , Rfl:   .  nitroGLYCERIN (NITROSTAT) 0.4 MG SL tablet, See admin instructions., Disp: , Rfl:  .  oxyCODONE-acetaminophen (PERCOCET) 10-325 MG tablet, Take 1 tablet by mouth every 8 (eight) hours as needed for pain., Disp: 90 tablet, Rfl: 0 .  oxyCODONE-acetaminophen (PERCOCET) 10-325 MG tablet, Take 1 tablet by mouth every 8 (eight) hours as needed for pain., Disp: 90 tablet, Rfl: 0 .  oxyCODONE-acetaminophen (PERCOCET) 10-325 MG tablet, Take 1 tablet by mouth every 8 (eight) hours as needed for pain., Disp: 90 tablet, Rfl: 0 .  rosuvastatin (CRESTOR) 5 MG tablet, Take 5 mg by mouth daily., Disp: , Rfl:  .  topiramate (TOPAMAX) 50 MG tablet, Take 1 tablet (50 mg total) by mouth daily., Disp: 30 tablet, Rfl: 5 .  vitamin Kristin-12 (CYANOCOBALAMIN) 100 MCG tablet, Take 100 mcg by mouth daily., Disp: , Rfl:  .  vitamin C (ASCORBIC ACID) 500 MG tablet, Take 500 mg by mouth daily., Disp: , Rfl:  .  zolpidem (AMBIEN) 5 MG tablet, Take 1 tablet (5 mg total) by mouth at bedtime as needed for sleep., Disp: 30 tablet, Rfl: 5 Social History   Socioeconomic History  . Marital status: Married    Spouse name: Not on file  . Number of children: Not on file  . Years of education: Not on file  . Highest education level: Not on file  Occupational History  .  Not on file  Tobacco Use  . Smoking status: Never Smoker  . Smokeless tobacco: Never Used  Vaping Use  . Vaping Use: Never used  Substance and Sexual Activity  . Alcohol use: No  . Drug use: No  . Sexual activity: Not on file  Other Topics Concern  . Not on file  Social History Narrative  . Not on file   Social Determinants of Health   Financial Resource Strain:   . Difficulty of Paying Living Expenses:   Food Insecurity:   . Worried About Charity fundraiser in the Last Year:   . Arboriculturist in the Last Year:   Transportation Needs:   . Film/video editor (Medical):   Marland Kitchen Lack of Transportation (Non-Medical):   Physical Activity:   . Days of  Exercise per Week:   . Minutes of Exercise per Session:   Stress:   . Feeling of Stress :   Social Connections:   . Frequency of Communication with Friends and Family:   . Frequency of Social Gatherings with Friends and Family:   . Attends Religious Services:   . Active Member of Clubs or Organizations:   . Attends Archivist Meetings:   Marland Kitchen Marital Status:   Intimate Partner Violence:   . Fear of Current or Ex-Partner:   . Emotionally Abused:   Marland Kitchen Physically Abused:   . Sexually Abused:    Family History  Problem Relation Age of Onset  . Congestive Heart Failure Mother   . Arthritis Mother   . Cancer Father        lung  . Arthritis/Rheumatoid Maternal Grandmother   . Cancer Paternal Grandfather        skin    Objective: Office vital signs reviewed. BP 125/78   Pulse 89   Temp 97.9 F (36.6 C) (Temporal)   Ht _0  (1.676 m)   Wt 168 lb (76.2 kg)   SpO2 98%   BMI 27.12 kg/m   Physical Examination:  General: Awake, alert, well nourished, No acute distress HEENT: Normal, sclera white, MMM Cardio: regular rate and rhythm, S1S2 heard, no murmurs appreciated Pulm: clear to auscultation bilaterally, no wheezes, rhonchi or rales; normal work of breathing on room air Extremities: warm, well perfused, No edema, cyanosis or clubbing; +2 pulses bilaterally MSK: antlagic gait and abnormal station; ambulates with cane Psych: mood stable.   Depression screen [facility] 2/9 09/26/2019 07/01/2019 03/24/2019  Decreased Interest 0 0 0  Down, Depressed, Hopeless 0 0 0  PHQ - 2 Score 0 0 0  Altered sleeping - 1 -  Tired, decreased energy - 1 -  Change in appetite - 0 -  Feeling bad or failure about yourself  - 0 -  Trouble concentrating - 0 -  Moving slowly or fidgety/restless - 0 -  Suicidal thoughts - 0 -  PHQ-9 Score - 2 -   Assessment/ Plan: 64 y.o. female   1. DDD (degenerative disc disease), lumbar Stable.  The national narcotic database was reviewed no red flags.   She is up-to-date controlled substance contract and UDS.  3 months of Percocet has been postdated. - oxyCODONE-acetaminophen (PERCOCET) 10-325 MG tablet; Take 1 tablet by mouth every 8 (eight) hours as needed for pain.  Dispense: 90 tablet; Refill: 0 - oxyCODONE-acetaminophen (PERCOCET) 10-325 MG tablet; Take 1 tablet by mouth every 8 (eight) hours as needed for pain.  Dispense: 90 tablet; Refill: 0 - oxyCODONE-acetaminophen (PERCOCET) 10-325 MG tablet; Take  1 tablet by mouth every 8 (eight) hours as needed for pain.  Dispense: 90 tablet; Refill: 0  2. Osteoarthritis of spine with radiculopathy, lumbosacral region - oxyCODONE-acetaminophen (PERCOCET) 10-325 MG tablet; Take 1 tablet by mouth every 8 (eight) hours as needed for pain.  Dispense: 90 tablet; Refill: 0 - oxyCODONE-acetaminophen (PERCOCET) 10-325 MG tablet; Take 1 tablet by mouth every 8 (eight) hours as needed for pain.  Dispense: 90 tablet; Refill: 0 - oxyCODONE-acetaminophen (PERCOCET) 10-325 MG tablet; Take 1 tablet by mouth every 8 (eight) hours as needed for pain.  Dispense: 90 tablet; Refill: 0  3. Chronic, continuous use of opioids  4. Compression fracture of body of thoracic vertebra (HCC) - oxyCODONE-acetaminophen (PERCOCET) 10-325 MG tablet; Take 1 tablet by mouth every 8 (eight) hours as needed for pain.  Dispense: 90 tablet; Refill: 0 - oxyCODONE-acetaminophen (PERCOCET) 10-325 MG tablet; Take 1 tablet by mouth every 8 (eight) hours as needed for pain.  Dispense: 90 tablet; Refill: 0 - oxyCODONE-acetaminophen (PERCOCET) 10-325 MG tablet; Take 1 tablet by mouth every 8 (eight) hours as needed for pain.  Dispense: 90 tablet; Refill: 0   No orders of the defined types were placed in this encounter.  No orders of the defined types were placed in this encounter.    Janora Norlander, DO [facility] [phone]

## 2019-12-30 DIAGNOSIS — D1723 Benign lipomatous neoplasm of skin and subcutaneous tissue of right leg: Secondary | ICD-10-CM | POA: Diagnosis not present

## 2019-12-30 DIAGNOSIS — C44729 Squamous cell carcinoma of skin of left lower limb, including hip: Secondary | ICD-10-CM | POA: Diagnosis not present

## 2019-12-30 DIAGNOSIS — D0472 Carcinoma in situ of skin of left lower limb, including hip: Secondary | ICD-10-CM | POA: Diagnosis not present

## 2019-12-30 DIAGNOSIS — D485 Neoplasm of uncertain behavior of skin: Secondary | ICD-10-CM | POA: Diagnosis not present

## 2020-02-02 DIAGNOSIS — C44729 Squamous cell carcinoma of skin of left lower limb, including hip: Secondary | ICD-10-CM | POA: Diagnosis not present

## 2020-02-02 DIAGNOSIS — L988 Other specified disorders of the skin and subcutaneous tissue: Secondary | ICD-10-CM | POA: Diagnosis not present

## 2020-02-02 DIAGNOSIS — D0472 Carcinoma in situ of skin of left lower limb, including hip: Secondary | ICD-10-CM | POA: Diagnosis not present

## 2020-02-05 DIAGNOSIS — C44729 Squamous cell carcinoma of skin of left lower limb, including hip: Secondary | ICD-10-CM | POA: Diagnosis not present

## 2020-02-28 DIAGNOSIS — R69 Illness, unspecified: Secondary | ICD-10-CM | POA: Diagnosis not present

## 2020-03-11 DIAGNOSIS — E785 Hyperlipidemia, unspecified: Secondary | ICD-10-CM | POA: Diagnosis not present

## 2020-03-11 DIAGNOSIS — R7303 Prediabetes: Secondary | ICD-10-CM | POA: Diagnosis not present

## 2020-03-11 DIAGNOSIS — I10 Essential (primary) hypertension: Secondary | ICD-10-CM | POA: Diagnosis not present

## 2020-03-12 DIAGNOSIS — E785 Hyperlipidemia, unspecified: Secondary | ICD-10-CM | POA: Diagnosis not present

## 2020-03-12 DIAGNOSIS — M1990 Unspecified osteoarthritis, unspecified site: Secondary | ICD-10-CM | POA: Diagnosis not present

## 2020-03-12 DIAGNOSIS — I25111 Atherosclerotic heart disease of native coronary artery with angina pectoris with documented spasm: Secondary | ICD-10-CM | POA: Diagnosis not present

## 2020-03-12 DIAGNOSIS — I10 Essential (primary) hypertension: Secondary | ICD-10-CM | POA: Diagnosis not present

## 2020-03-12 DIAGNOSIS — R7303 Prediabetes: Secondary | ICD-10-CM | POA: Diagnosis not present

## 2020-03-15 ENCOUNTER — Other Ambulatory Visit: Payer: Self-pay

## 2020-03-15 ENCOUNTER — Encounter: Payer: Self-pay | Admitting: Family Medicine

## 2020-03-15 ENCOUNTER — Ambulatory Visit (INDEPENDENT_AMBULATORY_CARE_PROVIDER_SITE_OTHER): Payer: Medicare HMO | Admitting: Family Medicine

## 2020-03-15 ENCOUNTER — Ambulatory Visit (INDEPENDENT_AMBULATORY_CARE_PROVIDER_SITE_OTHER): Payer: Medicare HMO

## 2020-03-15 VITALS — BP 129/72 | HR 87 | Temp 97.2°F | Ht 66.0 in | Wt 172.6 lb

## 2020-03-15 DIAGNOSIS — Z13820 Encounter for screening for osteoporosis: Secondary | ICD-10-CM

## 2020-03-15 DIAGNOSIS — C4492 Squamous cell carcinoma of skin, unspecified: Secondary | ICD-10-CM | POA: Diagnosis not present

## 2020-03-15 DIAGNOSIS — M5136 Other intervertebral disc degeneration, lumbar region: Secondary | ICD-10-CM | POA: Diagnosis not present

## 2020-03-15 DIAGNOSIS — R69 Illness, unspecified: Secondary | ICD-10-CM | POA: Diagnosis not present

## 2020-03-15 DIAGNOSIS — Z780 Asymptomatic menopausal state: Secondary | ICD-10-CM | POA: Diagnosis not present

## 2020-03-15 DIAGNOSIS — F1190 Opioid use, unspecified, uncomplicated: Secondary | ICD-10-CM

## 2020-03-15 DIAGNOSIS — S22000A Wedge compression fracture of unspecified thoracic vertebra, initial encounter for closed fracture: Secondary | ICD-10-CM

## 2020-03-15 DIAGNOSIS — M4727 Other spondylosis with radiculopathy, lumbosacral region: Secondary | ICD-10-CM

## 2020-03-15 DIAGNOSIS — M8589 Other specified disorders of bone density and structure, multiple sites: Secondary | ICD-10-CM | POA: Diagnosis not present

## 2020-03-15 DIAGNOSIS — F5101 Primary insomnia: Secondary | ICD-10-CM

## 2020-03-15 DIAGNOSIS — S22000S Wedge compression fracture of unspecified thoracic vertebra, sequela: Secondary | ICD-10-CM

## 2020-03-15 DIAGNOSIS — Z23 Encounter for immunization: Secondary | ICD-10-CM

## 2020-03-15 DIAGNOSIS — C44729 Squamous cell carcinoma of skin of left lower limb, including hip: Secondary | ICD-10-CM | POA: Insufficient documentation

## 2020-03-15 MED ORDER — FUROSEMIDE 20 MG PO TABS: 10.0000 mg | ORAL_TABLET | Freq: Every day | ORAL | 3 refills | Status: DC | PRN

## 2020-03-15 MED ORDER — ZOLPIDEM TARTRATE 5 MG PO TABS: 5.0000 mg | ORAL_TABLET | Freq: Every evening | ORAL | 5 refills | Status: DC | PRN

## 2020-03-15 MED ORDER — OXYCODONE-ACETAMINOPHEN 10-325 MG PO TABS: 1.0000 | ORAL_TABLET | Freq: Three times a day (TID) | ORAL | 0 refills | Status: DC | PRN

## 2020-03-15 MED ORDER — AMITRIPTYLINE HCL 100 MG PO TABS: 50.0000 mg | ORAL_TABLET | Freq: Every day | ORAL | 3 refills | Status: DC

## 2020-03-15 NOTE — Progress Notes (Signed)
Subjective: CC: f/u chronic pain, DDD, insomnia PCP: Janora Norlander, DO INO:MVEHM B Badalamenti is a 64 y.o. female presenting to clinic today for:  1.  Chronic pain History: She has history of fusion of the lumbar spine as well as fusion of the C-spine in 2 segments.  These were done by Dr. Joya Salm. Previous failed therapies/intolerances: Gabapentin (blurred vision); NSAIDS (cardiac history); corticosteroid injections to back (lasted only 2 weeks).  She is here for interval follow up on chronic pain.  She is prescribed percocet 10/347m TID (wean from Oxycontin 646mBID).   Today pain level is moderately controlled, with maximum tends to be around 8/10 when off meds.  Denies any constipation, falls, respiratory depression, visual or auditory hallucinations.  She continues to ambulate with a cane.  She does admit that she has been having some redness of her skin, tinnitus and pins and needle sensation in both feet and hands after taking her amitriptyline 100 mg, which she takes for neuropathy associated with her DDD.  She has been previously treated with gabapentin but this caused dizziness and blurred vision.  She has never been treated with Lyrica or Cymbalta.  She wants to reduce the amitriptyline but wanted to talk to me first.  2.  Insomnia Patient reports nightly use of Ambien 5 mg.  Again denies any visual or auditory hallucinations.  No excessive daytime sleepiness.  No falls.  3.  Squamous cell Skin cancer Patient with ongoing squamous cell skin cancers.  She is status post resection of several on the left lower extremity.  She has follow-up with dermatology again soon.  She reports very rare use of the hydrochlorothiazide, which was added on by cardiology, Dr. HaTerrence Dupont Edema is stable.   ROS: Per HPI  Allergies  Allergen Reactions   Gabapentin     Dizziness and blurred vision   Past Medical History:  Diagnosis Date   Anxiety    Arthritis    GERD (gastroesophageal reflux  disease)    Hyperlipidemia    Hypertension     Current Outpatient Medications:    amitriptyline (ELAVIL) 100 MG tablet, Take 1 tablet (100 mg total) by mouth at bedtime., Disp: 90 tablet, Rfl: 3   aspirin 81 MG chewable tablet, Chew 81 mg by mouth daily., Disp: , Rfl:    atenolol (TENORMIN) 25 MG tablet, Take 25 mg by mouth daily. , Disp: , Rfl: 2   cholecalciferol (VITAMIN D3) 25 MCG (1000 UT) tablet, Take 1,000 Units by mouth daily., Disp: , Rfl:    hydrochlorothiazide (HYDRODIURIL) 25 MG tablet, Take 12.5 mg by mouth every morning., Disp: , Rfl:    isosorbide mononitrate (IMDUR) 60 MG 24 hr tablet, Take 60 mg by mouth daily., Disp: , Rfl:    NARCAN 4 MG/0.1ML LIQD nasal spray kit, ADMINISTER A SINGLE SPRAY IN ONE NOSTRIL UPON SIGNS OF OPIOID OVERDOSE. CALL 911. REPEAT AFTER 3 MINUTES IF NO RESPONSE., Disp: , Rfl:    nitroGLYCERIN (NITROSTAT) 0.4 MG SL tablet, See admin instructions., Disp: , Rfl:    [START ON 03/24/2020] oxyCODONE-acetaminophen (PERCOCET) 10-325 MG tablet, Take 1 tablet by mouth every 8 (eight) hours as needed for pain., Disp: 90 tablet, Rfl: 0   oxyCODONE-acetaminophen (PERCOCET) 10-325 MG tablet, Take 1 tablet by mouth every 8 (eight) hours as needed for pain., Disp: 90 tablet, Rfl: 0   oxyCODONE-acetaminophen (PERCOCET) 10-325 MG tablet, Take 1 tablet by mouth every 8 (eight) hours as needed for pain., Disp: 90 tablet, Rfl: 0  rosuvastatin (CRESTOR) 5 MG tablet, Take 5 mg by mouth daily., Disp: , Rfl:    topiramate (TOPAMAX) 50 MG tablet, Take 1 tablet (50 mg total) by mouth daily., Disp: 30 tablet, Rfl: 5   vitamin B-12 (CYANOCOBALAMIN) 100 MCG tablet, Take 100 mcg by mouth daily., Disp: , Rfl:    vitamin C (ASCORBIC ACID) 500 MG tablet, Take 500 mg by mouth daily., Disp: , Rfl:    zolpidem (AMBIEN) 5 MG tablet, Take 1 tablet (5 mg total) by mouth at bedtime as needed for sleep., Disp: 30 tablet, Rfl: 5 Social History   Socioeconomic History    Marital status: Married    Spouse name: Not on file   Number of children: Not on file   Years of education: Not on file   Highest education level: Not on file  Occupational History   Not on file  Tobacco Use   Smoking status: Never Smoker   Smokeless tobacco: Never Used  Vaping Use   Vaping Use: Never used  Substance and Sexual Activity   Alcohol use: No   Drug use: No   Sexual activity: Not on file  Other Topics Concern   Not on file  Social History Narrative   Not on file   Social Determinants of Health   Financial Resource Strain:    Difficulty of Paying Living Expenses: Not on file  Food Insecurity:    Worried About [place] in the Last Year: Not on file   Ran Out of Food in the Last Year: Not on file  Transportation Needs:    Lack of Transportation (Medical): Not on file   Lack of Transportation (Non-Medical): Not on file  Physical Activity:    Days of Exercise per Week: Not on file   Minutes of Exercise per Session: Not on file  Stress:    Feeling of Stress : Not on file  Social Connections:    Frequency of Communication with Friends and Family: Not on file   Frequency of Social Gatherings with Friends and Family: Not on file   Attends Religious Services: Not on file   Active Member of Clubs or Organizations: Not on file   Attends Archivist Meetings: Not on file   Marital Status: Not on file  Intimate Partner Violence:    Fear of Current or Ex-Partner: Not on file   Emotionally Abused: Not on file   Physically Abused: Not on file   Sexually Abused: Not on file   Family History  Problem Relation Age of Onset   Congestive Heart Failure Mother    Arthritis Mother    Cancer Father        lung   Arthritis/Rheumatoid Maternal Grandmother    Cancer Paternal Grandfather        skin    Objective: Office vital signs reviewed. BP 129/72    Pulse 87    Temp (!) 97.2 F (36.2 C)    Ht _0  (1.676 m)     Wt 172 lb 9.6 oz (78.3 kg)    SpO2 99%    BMI 27.86 kg/m   Physical Examination:  General: Awake, alert, well nourished, No acute distress HEENT: Normal, sclera white, MMM Cardio: regular rate and rhythm, S1S2 heard, no murmurs appreciated Pulm: clear to auscultation bilaterally, no wheezes, rhonchi or rales; normal work of breathing on room air Extremities: warm, well perfused, No edema, cyanosis or clubbing; +2 pulses bilaterally MSK: antlagic gait and abnormal station; ambulates with  cane Psych: mood stable.  Speech normal, pleasant and interactive.  Good eye contact.  Does not appear to be responding to internal stimuli Depression screen [facility] 2/9 03/15/2020 12/29/2019 09/26/2019  Decreased Interest 0 0 0  Down, Depressed, Hopeless 0 0 0  PHQ - 2 Score 0 0 0  Altered sleeping - 0 -  Tired, decreased energy - 0 -  Change in appetite - 0 -  Feeling bad or failure about yourself  - 0 -  Trouble concentrating - 0 -  Moving slowly or fidgety/restless - 0 -  Suicidal thoughts - 0 -  PHQ-9 Score - 0 -   Assessment/ Plan: 64 y.o. female   1. DDD (degenerative disc disease), lumbar National narcotic database was reviewed and there were no red flags.  Future orders for Percocet prescribed.  I have reduced her amitriptyline to 50 mg daily.  I think that now that she getting a little older, this medication does fall within the beers list.  Would avoid TCAs if possible for this reason.  We discussed consideration for Lyrica versus Cymbalta.  She will let me know if this is needed.  She will follow up in 3 months, sooner if needed - oxyCODONE-acetaminophen (PERCOCET) 10-325 MG tablet; Take 1 tablet by mouth every 8 (eight) hours as needed for pain.  Dispense: 90 tablet; Refill: 0 - oxyCODONE-acetaminophen (PERCOCET) 10-325 MG tablet; Take 1 tablet by mouth every 8 (eight) hours as needed for pain.  Dispense: 90 tablet; Refill: 0 - oxyCODONE-acetaminophen (PERCOCET) 10-325 MG tablet; Take 1 tablet by  mouth every 8 (eight) hours as needed for pain.  Dispense: 90 tablet; Refill: 0 - ToxASSURE Select 13 (MW), Urine  2. Osteoarthritis of spine with radiculopathy, lumbosacral region - oxyCODONE-acetaminophen (PERCOCET) 10-325 MG tablet; Take 1 tablet by mouth every 8 (eight) hours as needed for pain.  Dispense: 90 tablet; Refill: 0 - oxyCODONE-acetaminophen (PERCOCET) 10-325 MG tablet; Take 1 tablet by mouth every 8 (eight) hours as needed for pain.  Dispense: 90 tablet; Refill: 0 - oxyCODONE-acetaminophen (PERCOCET) 10-325 MG tablet; Take 1 tablet by mouth every 8 (eight) hours as needed for pain.  Dispense: 90 tablet; Refill: 0 - ToxASSURE Select 13 (MW), Urine  3. Compression fracture of body of thoracic vertebra (HCC) - oxyCODONE-acetaminophen (PERCOCET) 10-325 MG tablet; Take 1 tablet by mouth every 8 (eight) hours as needed for pain.  Dispense: 90 tablet; Refill: 0 - oxyCODONE-acetaminophen (PERCOCET) 10-325 MG tablet; Take 1 tablet by mouth every 8 (eight) hours as needed for pain.  Dispense: 90 tablet; Refill: 0 - oxyCODONE-acetaminophen (PERCOCET) 10-325 MG tablet; Take 1 tablet by mouth every 8 (eight) hours as needed for pain.  Dispense: 90 tablet; Refill: 0  4. Chronic, continuous use of opioids Random UDS obtained today - ToxASSURE Select 13 (MW), Urine  5. Primary insomnia Stable.  No red flag signs or symptoms. - zolpidem (AMBIEN) 5 MG tablet; Take 1 tablet (5 mg total) by mouth at bedtime as needed for sleep.  Dispense: 30 tablet; Refill: 5 - ToxASSURE Select 13 (MW), Urine  6. Squamous cell skin cancer I have discontinued hydrochlorothiazide given recent article that correlates HCTZ with squamous cell skin cancers.  I have given her Lasix for as needed use.  7. Need for pneumococcal vaccination Administered - Pneumococcal conjugate vaccine 13-valent   No orders of the defined types were placed in this encounter.  Meds ordered this encounter  Medications    oxyCODONE-acetaminophen (PERCOCET) 10-325 MG tablet  Sig: Take 1 tablet by mouth every 8 (eight) hours as needed for pain.    Dispense:  90 tablet    Refill:  0   oxyCODONE-acetaminophen (PERCOCET) 10-325 MG tablet    Sig: Take 1 tablet by mouth every 8 (eight) hours as needed for pain.    Dispense:  90 tablet    Refill:  0   oxyCODONE-acetaminophen (PERCOCET) 10-325 MG tablet    Sig: Take 1 tablet by mouth every 8 (eight) hours as needed for pain.    Dispense:  90 tablet    Refill:  0   zolpidem (AMBIEN) 5 MG tablet    Sig: Take 1 tablet (5 mg total) by mouth at bedtime as needed for sleep.    Dispense:  30 tablet    Refill:  5   furosemide (LASIX) 20 MG tablet    Sig: Take 0.5-1 tablets (10-20 mg total) by mouth daily as needed for fluid or edema.    Dispense:  30 tablet    Refill:  3   amitriptyline (ELAVIL) 100 MG tablet    Sig: Take 0.5 tablets (50 mg total) by mouth at bedtime.    Dispense:  90 tablet    Refill:  3   Medications Discontinued During This Encounter  Medication Reason   zolpidem (AMBIEN) 5 MG tablet Reorder   oxyCODONE-acetaminophen (PERCOCET) 10-325 MG tablet Reorder   oxyCODONE-acetaminophen (PERCOCET) 10-325 MG tablet Reorder   oxyCODONE-acetaminophen (PERCOCET) 10-325 MG tablet Reorder   hydrochlorothiazide (HYDRODIURIL) 25 MG tablet Change in therapy    Janora Norlander, [place] [phone]

## 2020-03-15 NOTE — Patient Instructions (Signed)
Plan for full physical with fasting labs at next visit.  Controlled Substance Guidelines:  1. You cannot get an early refill, even it is lost.  2. You cannot get controlled medications from any other doctor, unless it is the emergency department and related to a new problem or injury.  3. You cannot use alcohol, marijuana, cocaine or any other recreational drugs while using this medication. This is very dangerous.  4. You are willing to have your urine drug tested at each visit.  5. You will not drive while using this medication, because that can put yourself and others in serious danger of an accident. 6. If any medication is stolen, then there must be a police report to verify it, or it cannot be refilled.  7. I will not prescribe these medications for longer than 3 months.  8. You must bring your pill bottle to each visit.  9. You must use the same pharmacy for all refills for the medication, unless you clear it with me beforehand.  10. You cannot share or sell this medication.

## 2020-03-17 NOTE — Progress Notes (Signed)
Patient notified and appt made

## 2020-03-18 LAB — TOXASSURE SELECT 13 (MW), URINE

## 2020-03-23 ENCOUNTER — Other Ambulatory Visit: Payer: Self-pay | Admitting: Family Medicine

## 2020-03-23 ENCOUNTER — Ambulatory Visit (INDEPENDENT_AMBULATORY_CARE_PROVIDER_SITE_OTHER): Payer: Medicare HMO | Admitting: Pharmacist

## 2020-03-23 DIAGNOSIS — M810 Age-related osteoporosis without current pathological fracture: Secondary | ICD-10-CM

## 2020-03-23 DIAGNOSIS — I25118 Atherosclerotic heart disease of native coronary artery with other forms of angina pectoris: Secondary | ICD-10-CM

## 2020-03-23 DIAGNOSIS — S22000A Wedge compression fracture of unspecified thoracic vertebra, initial encounter for closed fracture: Secondary | ICD-10-CM

## 2020-03-23 DIAGNOSIS — M8589 Other specified disorders of bone density and structure, multiple sites: Secondary | ICD-10-CM | POA: Diagnosis not present

## 2020-03-23 MED ORDER — ALENDRONATE SODIUM 70 MG PO TABS: 70.0000 mg | ORAL_TABLET | ORAL | 11 refills | Status: DC

## 2020-03-23 NOTE — Progress Notes (Signed)
    03/23/2020 Name: [name] MRN: [id number] DOB: 04-Mar-1956  S:  64yoF presents for osteoporosis clinic as referred by PCP.    Current Height:    5'6"    Max Lifetime Height: 5'6" Current Weight:  172lbs       Ethnicity:Caucasian  BP:  120/80     HPI: Does pt already have a diagnosis of:  Osteopenia?  Yes Osteoporosis?  No  Back Pain?  Yes       Kyphosis?  No Prior fracture?  Yes stress fractures, neck, back and foot Med(s) for Osteoporosis/Osteopenia:  Vitamin D Med(s) previously tried for Osteoporosis/Osteopenia:  n/a                                                             PMH: Age at menopause:  Hysterectomy?  Yes, age 80 HRT? No Steroid Use?  No Thyroid med?  No History of cancer?  No History of digestive disorders (ie Crohn's)?  No Current or previous eating disorders?  No Last Vitamin D Result:  needs Last GFR Result:  66 (09/26/19)   FH/SH: Family history of osteoporosis?  Yes mother Parent with history of hip fracture?  No Family history of breast cancer?  No Exercise?  Yes, walks some Smoking?  No Alcohol?  No    Calcium Assessment Calcium Intake  # of servings/day  Calcium mg  Milk (8 oz) 1  x  300  = 300  Yogurt (4 oz) 1 x  200 = 200  Cheese (1 oz) 2 x  200 = 400  Other Calcium sources   250mg   Ca supplement 0 = 0   Estimated calcium intake per day 1150mg     DEXA Results FINDINGS:  AP LUMBAR SPINE L1 through L4 Bone Mineral Density (BMD):  0.982 g/cm2 Young Adult T-Score:  -1.7 Z-Score:  -0.6  LEFT FEMUR NECK Bone Mineral Density (BMD):  0.874 g/cm2 Young Adult T-Score: -1.2 Z-Score:  0.0  LEFT FOREARM (1/3 RADIUS) Bone Mineral Density (BMD):  0.726 Young Adult T Score:  -1.8 Z Score:  -0.4  FRAX 10 year estimate/Assessment: ASSESSMENT: Patient's diagnostic category is LOW BONE MASS/OSTEOPENIA by [facility] Criteria.  FRACTURE RISK: INCREASED  FRAX: Based on the [place] model, the 10 year probability  of a major osteoporotic fracture is 25.5%. The 10 year probability of a hip fracture is 1.3%.  Medication for her bone should be considered because her 10-year risk of a major osteoporotic fracture is greater than 20%.    Recommendations: 1.  Start  alendronate (FOSAMAX) GFR 66 2.  recommend calcium 1200mg  daily through supplementation or diet.   3.  recommend weight bearing exercise - 30 minutes at least 4 days per week.   4.  Counseled and educated about fall risk and prevention.  Recheck DEXA:  2 years  Time spent counseling patient:  20 minutes   Regina Eck, PharmD, BCPS Clinical Pharmacist, [place]  II Phone [phone]

## 2020-03-30 DIAGNOSIS — L570 Actinic keratosis: Secondary | ICD-10-CM | POA: Diagnosis not present

## 2020-03-30 DIAGNOSIS — L814 Other melanin hyperpigmentation: Secondary | ICD-10-CM | POA: Diagnosis not present

## 2020-03-30 DIAGNOSIS — L578 Other skin changes due to chronic exposure to nonionizing radiation: Secondary | ICD-10-CM | POA: Diagnosis not present

## 2020-03-30 DIAGNOSIS — D0461 Carcinoma in situ of skin of right upper limb, including shoulder: Secondary | ICD-10-CM | POA: Diagnosis not present

## 2020-03-30 DIAGNOSIS — L821 Other seborrheic keratosis: Secondary | ICD-10-CM | POA: Diagnosis not present

## 2020-03-30 DIAGNOSIS — Z8589 Personal history of malignant neoplasm of other organs and systems: Secondary | ICD-10-CM | POA: Diagnosis not present

## 2020-03-30 DIAGNOSIS — Z85828 Personal history of other malignant neoplasm of skin: Secondary | ICD-10-CM | POA: Diagnosis not present

## 2020-03-30 DIAGNOSIS — C44529 Squamous cell carcinoma of skin of other part of trunk: Secondary | ICD-10-CM | POA: Diagnosis not present

## 2020-03-30 DIAGNOSIS — Z86007 Personal history of in-situ neoplasm of skin: Secondary | ICD-10-CM | POA: Diagnosis not present

## 2020-03-30 DIAGNOSIS — D0471 Carcinoma in situ of skin of right lower limb, including hip: Secondary | ICD-10-CM | POA: Diagnosis not present

## 2020-03-30 DIAGNOSIS — D485 Neoplasm of uncertain behavior of skin: Secondary | ICD-10-CM | POA: Diagnosis not present

## 2020-04-09 DIAGNOSIS — D0471 Carcinoma in situ of skin of right lower limb, including hip: Secondary | ICD-10-CM | POA: Diagnosis not present

## 2020-04-09 DIAGNOSIS — L0889 Other specified local infections of the skin and subcutaneous tissue: Secondary | ICD-10-CM | POA: Diagnosis not present

## 2020-04-09 DIAGNOSIS — L905 Scar conditions and fibrosis of skin: Secondary | ICD-10-CM | POA: Diagnosis not present

## 2020-04-09 DIAGNOSIS — C44511 Basal cell carcinoma of skin of breast: Secondary | ICD-10-CM | POA: Diagnosis not present

## 2020-04-09 DIAGNOSIS — D0461 Carcinoma in situ of skin of right upper limb, including shoulder: Secondary | ICD-10-CM | POA: Diagnosis not present

## 2020-05-19 DIAGNOSIS — L905 Scar conditions and fibrosis of skin: Secondary | ICD-10-CM | POA: Diagnosis not present

## 2020-05-19 DIAGNOSIS — C44521 Squamous cell carcinoma of skin of breast: Secondary | ICD-10-CM | POA: Diagnosis not present

## 2020-05-25 DIAGNOSIS — M25551 Pain in right hip: Secondary | ICD-10-CM | POA: Insufficient documentation

## 2020-05-27 DIAGNOSIS — M25551 Pain in right hip: Secondary | ICD-10-CM | POA: Diagnosis not present

## 2020-05-27 DIAGNOSIS — M1611 Unilateral primary osteoarthritis, right hip: Secondary | ICD-10-CM | POA: Diagnosis not present

## 2020-06-08 ENCOUNTER — Telehealth: Payer: Self-pay | Admitting: *Deleted

## 2020-06-08 NOTE — Telephone Encounter (Signed)
PA started for [facility] generic 5 mg tabs Key: [id number]   Per Cover my meds - she may have to try and fail Doxepin 3 mg or 6 mg for her plan to cover    Sent to Plan today and will wait for final outcome

## 2020-06-09 NOTE — Telephone Encounter (Signed)
Appeal in process = called in appeal  Ph# [phone]  Questions answered APPROVED + but will go to clinical review  Case # [id number]  Will hear by fax within 72 hours.

## 2020-06-09 NOTE — Telephone Encounter (Signed)
Renewal created today - Key: [id number] Re-sent to plan

## 2020-06-09 NOTE — Telephone Encounter (Signed)
Pt is on Amitriptyline.  She cannot take Doxepin as this is the same drug class. Please resubmit PA

## 2020-06-09 NOTE — Telephone Encounter (Signed)
Denied on January 4 Your request has been denied   Why did we deny your request? We denied this request under Medicare Part D because: The information provided by your prescriber did not  meet the requirements for covering this medication (prior authorization).  Your plan does not allow coverage of this medication based on your prescriber answering No to the following  question(s): The American Geriatrics Society identifies the use of this medication as potentially inappropriate in older  adults, meaning it is best avoided, prescribed at reduced dosage, or used with caution or carefully monitored.  Has the patient tried the non-HRM (non-High Risk Medication) alternative drug doxepin (3 mg or 6 mg)?  Does the patient have a contraindication to the non-HRM (non-High Risk Medication) alternative drug doxepin  (3 mg or 6 mg)? You should share a copy of this decision with your prescriber so you and your prescriber can discuss next steps.  If your prescriber requested coverage on your behalf, we have shared this decision with your prescriber

## 2020-06-10 NOTE — Telephone Encounter (Signed)
PA approved from 06/05/20-06/04/21.  Pharmacy aware.

## 2020-06-11 DIAGNOSIS — I25111 Atherosclerotic heart disease of native coronary artery with angina pectoris with documented spasm: Secondary | ICD-10-CM | POA: Diagnosis not present

## 2020-06-11 DIAGNOSIS — E785 Hyperlipidemia, unspecified: Secondary | ICD-10-CM | POA: Diagnosis not present

## 2020-06-11 DIAGNOSIS — R7303 Prediabetes: Secondary | ICD-10-CM | POA: Diagnosis not present

## 2020-06-11 DIAGNOSIS — I10 Essential (primary) hypertension: Secondary | ICD-10-CM | POA: Diagnosis not present

## 2020-06-11 DIAGNOSIS — D472 Monoclonal gammopathy: Secondary | ICD-10-CM | POA: Diagnosis not present

## 2020-06-13 DIAGNOSIS — S80212A Abrasion, left knee, initial encounter: Secondary | ICD-10-CM | POA: Diagnosis not present

## 2020-06-13 DIAGNOSIS — S50311A Abrasion of right elbow, initial encounter: Secondary | ICD-10-CM | POA: Diagnosis not present

## 2020-06-13 DIAGNOSIS — S82035A Nondisplaced transverse fracture of left patella, initial encounter for closed fracture: Secondary | ICD-10-CM | POA: Diagnosis not present

## 2020-06-15 ENCOUNTER — Other Ambulatory Visit: Payer: Self-pay

## 2020-06-15 ENCOUNTER — Ambulatory Visit (INDEPENDENT_AMBULATORY_CARE_PROVIDER_SITE_OTHER): Payer: Medicare HMO | Admitting: Family Medicine

## 2020-06-15 ENCOUNTER — Encounter: Payer: Self-pay | Admitting: Family Medicine

## 2020-06-15 VITALS — BP 126/79 | HR 81 | Temp 97.4°F | Ht 66.0 in | Wt 177.0 lb

## 2020-06-15 DIAGNOSIS — S22000A Wedge compression fracture of unspecified thoracic vertebra, initial encounter for closed fracture: Secondary | ICD-10-CM

## 2020-06-15 DIAGNOSIS — M4727 Other spondylosis with radiculopathy, lumbosacral region: Secondary | ICD-10-CM

## 2020-06-15 DIAGNOSIS — S22000D Wedge compression fracture of unspecified thoracic vertebra, subsequent encounter for fracture with routine healing: Secondary | ICD-10-CM

## 2020-06-15 DIAGNOSIS — F1190 Opioid use, unspecified, uncomplicated: Secondary | ICD-10-CM | POA: Diagnosis not present

## 2020-06-15 DIAGNOSIS — M5136 Other intervertebral disc degeneration, lumbar region: Secondary | ICD-10-CM | POA: Diagnosis not present

## 2020-06-15 DIAGNOSIS — R69 Illness, unspecified: Secondary | ICD-10-CM | POA: Diagnosis not present

## 2020-06-15 MED ORDER — AMITRIPTYLINE HCL 100 MG PO TABS: 50.0000 mg | ORAL_TABLET | Freq: Every day | ORAL | 3 refills | Status: DC

## 2020-06-15 MED ORDER — OXYCODONE-ACETAMINOPHEN 10-325 MG PO TABS: 1.0000 | ORAL_TABLET | Freq: Three times a day (TID) | ORAL | 0 refills | Status: DC | PRN

## 2020-06-15 MED ORDER — TOPIRAMATE 25 MG PO TABS: ORAL_TABLET | ORAL | 0 refills | Status: DC

## 2020-06-15 MED ORDER — DICLOFENAC SODIUM 1 % EX GEL: 4.0000 g | Freq: Four times a day (QID) | CUTANEOUS | 0 refills | Status: DC | PRN

## 2020-06-15 MED ORDER — OXYCODONE-ACETAMINOPHEN 10-325 MG PO TABS
1.0000 | ORAL_TABLET | Freq: Three times a day (TID) | ORAL | 0 refills | Status: DC | PRN
Start: 2020-06-23 — End: 2020-09-13

## 2020-06-15 NOTE — Progress Notes (Signed)
Subjective: CC: Follow-up chronic pain PCP: Janora Norlander, DO [id number] [name] is a 65 y.o. female presenting to clinic today for:  1.  Chronic pain, lumbosacral Patient with known chronic lumbosacral pain.  She has been stable 3 times daily use of Percocet.  She is here for interval recheck and refills.  Denies any excessive daytime sedation, falls, respiratory depression, visual or auditory hallucinations.  She has switched pharmacies to [place].  She is motivated to get tapered off of amitriptyline as she would like to minimize polypharmacy.  She has started reducing her dose to 50 mg.  She also is planning on tapering off of Topamax.  She thinks this may have been started for neuropathic/musculoskeletal pain but is unsure.  No known migraine headaches   ROS: Per HPI  Allergies  Allergen Reactions  . Gabapentin     Dizziness and blurred vision   Past Medical History:  Diagnosis Date  . Anxiety   . Arthritis   . GERD (gastroesophageal reflux disease)   . Hyperlipidemia   . Hypertension     Current Outpatient Medications:  .  alendronate (FOSAMAX) 70 MG tablet, Take 1 tablet (70 mg total) by mouth every 7 (seven) days. Take with a full glass of water on an empty stomach., Disp: 4 tablet, Rfl: 11 .  amitriptyline (ELAVIL) 100 MG tablet, Take 0.5 tablets (50 mg total) by mouth at bedtime., Disp: 90 tablet, Rfl: 3 .  aspirin 81 MG chewable tablet, Chew 81 mg by mouth daily., Disp: , Rfl:  .  atenolol (TENORMIN) 25 MG tablet, Take 25 mg by mouth daily. , Disp: , Rfl: 2 .  cholecalciferol (VITAMIN D3) 25 MCG (1000 UT) tablet, Take 1,000 Units by mouth daily., Disp: , Rfl:  .  isosorbide mononitrate (IMDUR) 60 MG 24 hr tablet, Take 60 mg by mouth daily., Disp: , Rfl:  .  nitroGLYCERIN (NITROSTAT) 0.4 MG SL tablet, See admin instructions., Disp: , Rfl:  .  oxyCODONE-acetaminophen (PERCOCET) 10-325 MG tablet, Take 1 tablet by mouth every 8 (eight) hours as needed for  pain., Disp: 90 tablet, Rfl: 0 .  oxyCODONE-acetaminophen (PERCOCET) 10-325 MG tablet, Take 1 tablet by mouth every 8 (eight) hours as needed for pain., Disp: 90 tablet, Rfl: 0 .  oxyCODONE-acetaminophen (PERCOCET) 10-325 MG tablet, Take 1 tablet by mouth every 8 (eight) hours as needed for pain., Disp: 90 tablet, Rfl: 0 .  rosuvastatin (CRESTOR) 5 MG tablet, Take 5 mg by mouth daily., Disp: , Rfl:  .  topiramate (TOPAMAX) 50 MG tablet, Take 1 tablet by mouth once daily, Disp: 90 tablet, Rfl: 0 .  vitamin Kristin-12 (CYANOCOBALAMIN) 100 MCG tablet, Take 100 mcg by mouth daily., Disp: , Rfl:  .  vitamin C (ASCORBIC ACID) 500 MG tablet, Take 500 mg by mouth daily., Disp: , Rfl:  .  zolpidem (AMBIEN) 5 MG tablet, Take 1 tablet (5 mg total) by mouth at bedtime as needed for sleep., Disp: 30 tablet, Rfl: 5 .  furosemide (LASIX) 20 MG tablet, Take 0.5-1 tablets (10-20 mg total) by mouth daily as needed for fluid or edema. (Patient not taking: Reported on 06/15/2020), Disp: 30 tablet, Rfl: 3 .  NARCAN 4 MG/0.1ML LIQD nasal spray kit, ADMINISTER A SINGLE SPRAY IN ONE NOSTRIL UPON SIGNS OF OPIOID OVERDOSE. CALL 911. REPEAT AFTER 3 MINUTES IF NO RESPONSE. (Patient not taking: Reported on 06/15/2020), Disp: , Rfl:  Social History   Socioeconomic History  . Marital status: Married  Spouse name: Not on file  . Number of children: Not on file  . Years of education: Not on file  . Highest education level: Not on file  Occupational History  . Not on file  Tobacco Use  . Smoking status: Never Smoker  . Smokeless tobacco: Never Used  Vaping Use  . Vaping Use: Never used  Substance and Sexual Activity  . Alcohol use: No  . Drug use: No  . Sexual activity: Not on file  Other Topics Concern  . Not on file  Social History Narrative  . Not on file   Social Determinants of Health   Financial Resource Strain: Not on file  Food Insecurity: Not on file  Transportation Needs: Not on file  Physical Activity: Not  on file  Stress: Not on file  Social Connections: Not on file  Intimate Partner Violence: Not on file   Family History  Problem Relation Age of Onset  . Congestive Heart Failure Mother   . Arthritis Mother   . Cancer Father        lung  . Arthritis/Rheumatoid Maternal Grandmother   . Cancer Paternal Grandfather        skin    Objective: Office vital signs reviewed. BP 126/79   Pulse 81   Temp (!) 97.4 F (36.3 C) (Temporal)   Ht 5' 6"  (1.676 m)   Wt 177 lb (80.3 kg)   SpO2 98%   BMI 28.57 kg/m   Physical Examination:  General: Awake, alert, well appearing, No acute distress HEENT: Normal; sclera white.  Moist mucous membranes Cardio: regular rate and rhythm, S1S2 heard, no murmurs appreciated Pulm: clear to auscultation bilaterally, no wheezes, rhonchi or rales; normal work of breathing on room air MSK: Antalgic gait.  Utilizing cane for ambulation  Assessment/ Plan: 65 y.o. female   DDD (degenerative disc disease), lumbar - Plan: oxyCODONE-acetaminophen (PERCOCET) 10-325 MG tablet, oxyCODONE-acetaminophen (PERCOCET) 10-325 MG tablet, oxyCODONE-acetaminophen (PERCOCET) 10-325 MG tablet, amitriptyline (ELAVIL) 100 MG tablet, DISCONTINUED: amitriptyline (ELAVIL) 100 MG tablet  Osteoarthritis of spine with radiculopathy, lumbosacral region - Plan: oxyCODONE-acetaminophen (PERCOCET) 10-325 MG tablet, oxyCODONE-acetaminophen (PERCOCET) 10-325 MG tablet, oxyCODONE-acetaminophen (PERCOCET) 10-325 MG tablet  Chronic, continuous use of opioids  Compression fracture of body of thoracic vertebra (HCC) - Plan: oxyCODONE-acetaminophen (PERCOCET) 10-325 MG tablet, oxyCODONE-acetaminophen (PERCOCET) 10-325 MG tablet, oxyCODONE-acetaminophen (PERCOCET) 10-325 MG tablet  Pain is somewhat exacerbated but really in the right hip.  Keep appointment with orthopedics.  Hopefully she will respond to the corticosteroid injection.  If not, we will plan for our next visit to be a preop clearance.   There are no red flags.  The national narcotic database was reviewed.  She is up-to-date on CSA and UDS.  Voltaren gel sample also provided today.  She is attempting to wean off of some of her medicines.  Topamax has been reduced to 25 mg daily for the next month with half dose for a month and then every other day for 2 weeks then off.  Additionally, she is going to try and wean down on her amitriptyline to 50 mg.  New prescriptions have been sent to her pharmacy in [facility]  No orders of the defined types were placed in this encounter.  No orders of the defined types were placed in this encounter.    Janora Norlander, DO [place] [phone]

## 2020-06-23 DIAGNOSIS — D0471 Carcinoma in situ of skin of right lower limb, including hip: Secondary | ICD-10-CM | POA: Diagnosis not present

## 2020-06-23 DIAGNOSIS — D0461 Carcinoma in situ of skin of right upper limb, including shoulder: Secondary | ICD-10-CM | POA: Diagnosis not present

## 2020-07-15 DIAGNOSIS — L309 Dermatitis, unspecified: Secondary | ICD-10-CM | POA: Diagnosis not present

## 2020-07-17 ENCOUNTER — Other Ambulatory Visit: Payer: Self-pay | Admitting: Family Medicine

## 2020-07-28 ENCOUNTER — Ambulatory Visit (INDEPENDENT_AMBULATORY_CARE_PROVIDER_SITE_OTHER): Payer: Medicare HMO | Admitting: Nurse Practitioner

## 2020-07-28 ENCOUNTER — Encounter: Payer: Self-pay | Admitting: Nurse Practitioner

## 2020-07-28 ENCOUNTER — Other Ambulatory Visit: Payer: Self-pay

## 2020-07-28 VITALS — BP 139/88 | HR 98 | Temp 98.3°F | Ht 66.0 in | Wt 175.8 lb

## 2020-07-28 DIAGNOSIS — N12 Tubulo-interstitial nephritis, not specified as acute or chronic: Secondary | ICD-10-CM

## 2020-07-28 DIAGNOSIS — R300 Dysuria: Secondary | ICD-10-CM | POA: Diagnosis not present

## 2020-07-28 LAB — URINALYSIS, COMPLETE
Bilirubin, UA: NEGATIVE
Glucose, UA: NEGATIVE
Ketones, UA: NEGATIVE
Nitrite, UA: NEGATIVE
Specific Gravity, UA: 1.015 (ref 1.005–1.030)
Urobilinogen, Ur: 0.2 mg/dL (ref 0.2–1.0)
pH, UA: 5.5 (ref 5.0–7.5)

## 2020-07-28 LAB — MICROSCOPIC EXAMINATION: Epithelial Cells (non renal): NONE SEEN /hpf (ref 0–10)

## 2020-07-28 MED ORDER — CEFTRIAXONE SODIUM 1 G IJ SOLR
1.0000 g | Freq: Once | INTRAMUSCULAR | Status: AC
  Administered 2020-07-28: 1 g via INTRAMUSCULAR

## 2020-07-28 MED ORDER — SULFAMETHOXAZOLE-TRIMETHOPRIM 800-160 MG PO TABS: 1.0000 | ORAL_TABLET | Freq: Two times a day (BID) | ORAL | 0 refills | Status: DC

## 2020-07-28 NOTE — Patient Instructions (Signed)
Dysuria Pyelonephritis, Adult  Pyelonephritis is an infection that occurs in the kidney. The kidneys are organs that help clean the blood by moving waste out of the blood and into the pee (urine). This infection can happen quickly, or it can last for a long time. In most cases, it clears up with treatment and does not cause other problems. What are the causes? This condition may be caused by:  Germs (bacteria) going from the bladder up to the kidney. This may happen after having a bladder infection.  Germs going from the blood to the kidney. What increases the risk? This condition is more likely to develop in:  Pregnant women.  Older people.  People who have any of these conditions: ? Diabetes. ? Inflammation of the prostate gland (prostatitis), in males. ? Kidney stones or bladder stones. ? Other problems with the kidney or the parts of your body that carry pee from the kidneys to the bladder (ureters). ? Cancer.  People who have a small, thin tube (catheter) placed in the bladder.  People who are sexually active.  Women who use a medicine that kills sperm (spermicide) to prevent pregnancy.  People who have had a prior urinary tract infection (UTI). What are the signs or symptoms? Symptoms of this condition include:  Peeing often.  A strong urge to pee right away.  Burning or stinging when peeing.  Belly pain.  Back pain.  Pain in the side (flank area).  Fever or chills.  Blood in the pee, or dark pee.  Feeling sick to your stomach (nauseous) or throwing up (vomiting). How is this treated? This condition may be treated by:  Taking antibiotic medicines by mouth (orally).  Drinking enough fluids. If the infection is bad, you may need to stay in the hospital. You may be given antibiotics and fluids that are put directly into a vein through an IV tube. In some cases, other treatments may be needed. Follow these instructions at home: Medicines  Take your  antibiotic medicine as told by your doctor. Do not stop taking the antibiotic even if you start to feel better.  Take over-the-counter and prescription medicines only as told by your doctor. General instructions  Drink enough fluid to keep your pee pale yellow.  Avoid caffeine, tea, and carbonated drinks.  Pee (urinate) often. Avoid holding in pee for long periods of time.  Pee before and after sex.  After pooping (having a bowel movement), women should wipe from front to back. Use each tissue only once.  Keep all follow-up visits as told by your doctor. This is important.   Contact a doctor if:  You do not feel better after 2 days.  Your symptoms get worse.  You have a fever. Get help right away if:  You cannot take your medicine or drink fluids as told.  You have chills and shaking.  You throw up.  You have very bad pain in your side or back.  You feel very weak or you pass out (faint). Summary  Pyelonephritis is an infection that occurs in the kidney.  In most cases, this infection clears up with treatment and does not cause other problems.  Take your antibiotic medicine as told by your doctor. Do not stop taking the antibiotic even if you start to feel better.  Drink enough fluid to keep your pee pale yellow. This information is not intended to replace advice given to you by your health care provider. Make sure you discuss any questions you  have with your health care provider. Document Revised: 03/26/2018 Document Reviewed: 03/26/2018 Elsevier Patient Education  2021 [place].  Dysuria is pain or discomfort during urination. The pain or discomfort may be felt in the part of the body that drains urine from the bladder (urethra) or in the surrounding tissue of the genitals. The pain may also be felt in the groin area, lower abdomen, or lower back. You may have to urinate frequently or have the sudden feeling that you have to urinate (urgency). Dysuria can affect  anyone, but it is more common in females. Dysuria can be caused by many different things, including:  Urinary tract infection.  Kidney stones or bladder stones.  Certain STIs (sexually transmitted infections), such as chlamydia.  Dehydration.  Inflammation of the tissues of the vagina.  Use of certain medicines.  Use of certain soaps or scented products that cause irritation. Follow these instructions at home: Medicines  Take over-the-counter and prescription medicines only as told by your health care provider.  If you were prescribed an antibiotic medicine, take it as told by your health care provider. Do not stop taking the antibiotic even if you start to feel better. Eating and drinking  Drink enough fluid to keep your urine pale yellow.  Avoid caffeinated beverages, tea, and alcohol. These beverages can irritate the bladder and make dysuria worse. In males, alcohol may irritate the prostate.   General instructions  Watch your condition for any changes.  Urinate often. Avoid holding urine for long periods of time.  If you are female, you should wipe from front to back after urinating or having a bowel movement. Use each piece of toilet paper only once.  Empty your bladder after sex.  Keep all follow-up visits. This is important.  If you had any tests done to find the cause of dysuria, it is up to you to get your test results. Ask your health care provider, or the department that is doing the test, when your results will be ready. Contact a health care provider if:  You have a fever.  You develop pain in your back or sides.  You have nausea or vomiting.  You have blood in your urine.  You are not urinating as often as you usually do. Get help right away if:  Your pain is severe and not relieved with medicines.  You cannot eat or drink without vomiting.  You are confused.  You have a rapid heartbeat while resting.  You have shaking or chills.  You feel  extremely weak. Summary  Dysuria is pain or discomfort while urinating. Many different conditions can lead to dysuria.  If you have dysuria, you may have to urinate frequently or have the sudden feeling that you have to urinate (urgency).  Watch your condition for any changes. Keep all follow-up visits.  Make sure that you urinate often and drink enough fluid to keep your urine pale yellow. This information is not intended to replace advice given to you by your health care provider. Make sure you discuss any questions you have with your health care provider. Document Revised: 01/02/2020 Document Reviewed: 01/02/2020 Elsevier Patient Education  [place].

## 2020-07-28 NOTE — Progress Notes (Signed)
Acute Office Visit  Subjective:    Patient ID: [name], female    DOB: 09/23/1955, 65 y.o.   MRN: [id number]  Chief Complaint  Patient presents with  . Dysuria    Dysuria  This is a new problem. The current episode started 1 to 4 weeks ago. The problem occurs every urination. The problem has been gradually worsening. The quality of the pain is described as aching. The pain is moderate. Maximum temperature: 99.9  The fever has been present for less than 1 day. She is not sexually active. There is no history of pyelonephritis. Associated symptoms include chills, flank pain, frequency, nausea and urgency. Pertinent negatives include no vomiting. She has tried nothing for the symptoms. There is no history of catheterization or kidney stones.    Past Medical History:  Diagnosis Date  . Anxiety   . Arthritis   . GERD (gastroesophageal reflux disease)   . Hyperlipidemia   . Hypertension     Past Surgical History:  Procedure Laterality Date  . ABDOMINAL HYSTERECTOMY    . CERVICAL FUSION    . KNEE ARTHROSCOPY Left   . LUMBAR FUSION    . skin cancer removal  09/25/2019   right thigh    Family History  Problem Relation Age of Onset  . Congestive Heart Failure Mother   . Arthritis Mother   . Cancer Father        lung  . Arthritis/Rheumatoid Maternal Grandmother   . Cancer Paternal Grandfather        skin    Social History   Socioeconomic History  . Marital status: Married    Spouse name: Not on file  . Number of children: Not on file  . Years of education: Not on file  . Highest education level: Not on file  Occupational History  . Not on file  Tobacco Use  . Smoking status: Never Smoker  . Smokeless tobacco: Never Used  Vaping Use  . Vaping Use: Never used  Substance and Sexual Activity  . Alcohol use: No  . Drug use: No  . Sexual activity: Not on file  Other Topics Concern  . Not on file  Social History Narrative  . Not on file   Social Determinants  of Health   Financial Resource Strain: Not on file  Food Insecurity: Not on file  Transportation Needs: Not on file  Physical Activity: Not on file  Stress: Not on file  Social Connections: Not on file  Intimate Partner Violence: Not on file    Outpatient Medications Prior to Visit  Medication Sig Dispense Refill  . alendronate (FOSAMAX) 70 MG tablet Take 1 tablet (70 mg total) by mouth every 7 (seven) days. Take with a full glass of water on an empty stomach. 4 tablet 11  . amitriptyline (ELAVIL) 100 MG tablet Take 0.5-1 tablets (50-100 mg total) by mouth at bedtime. Place on hold 90 tablet 3  . aspirin 81 MG chewable tablet Chew 81 mg by mouth daily.    Marland Kitchen atenolol (TENORMIN) 25 MG tablet Take 25 mg by mouth daily.   2  . cholecalciferol (VITAMIN D3) 25 MCG (1000 UT) tablet Take 1,000 Units by mouth daily.    . diclofenac Sodium (VOLTAREN) 1 % GEL Apply 4 g topically 4 (four) times daily as needed. 20 g 0  . furosemide (LASIX) 20 MG tablet TAKE 0.5-1 TABLETS (10-20 MG TOTAL) BY MOUTH DAILY AS NEEDED FOR FLUID OR EDEMA. 30 tablet 1  .  isosorbide mononitrate (IMDUR) 60 MG 24 hr tablet Take 60 mg by mouth daily.    Marland Kitchen NARCAN 4 MG/0.1ML LIQD nasal spray kit     . nitroGLYCERIN (NITROSTAT) 0.4 MG SL tablet See admin instructions.    Derrill Memo ON 08/21/2020] oxyCODONE-acetaminophen (PERCOCET) 10-325 MG tablet Take 1 tablet by mouth every 8 (eight) hours as needed for pain. 90 tablet 0  . oxyCODONE-acetaminophen (PERCOCET) 10-325 MG tablet Take 1 tablet by mouth every 8 (eight) hours as needed for pain. 90 tablet 0  . oxyCODONE-acetaminophen (PERCOCET) 10-325 MG tablet Take 1 tablet by mouth every 8 (eight) hours as needed for pain. 90 tablet 0  . rosuvastatin (CRESTOR) 5 MG tablet Take 5 mg by mouth daily.    Marland Kitchen topiramate (TOPAMAX) 25 MG tablet Take 1 tablet (25 mg total) by mouth daily for 30 days, THEN 0.5 tablets (12.5 mg total) daily for 30 days, THEN 0.5 tablets (12.5 mg total) every other  day for 14 days. 50 tablet 0  . vitamin B-12 (CYANOCOBALAMIN) 100 MCG tablet Take 100 mcg by mouth daily.    . vitamin C (ASCORBIC ACID) 500 MG tablet Take 500 mg by mouth daily.    Marland Kitchen zolpidem (AMBIEN) 5 MG tablet Take 1 tablet (5 mg total) by mouth at bedtime as needed for sleep. 30 tablet 5   No facility-administered medications prior to visit.    Allergies  Allergen Reactions  . Gabapentin     Dizziness and blurred vision    Review of Systems  Constitutional: Positive for chills.  HENT: Negative.   Respiratory: Negative.   Gastrointestinal: Positive for nausea. Negative for vomiting.  Genitourinary: Positive for dysuria, flank pain, frequency and urgency.  Skin: Negative.   All other systems reviewed and are negative.      Objective:    Physical Exam Vitals reviewed.  HENT:     Head: Normocephalic.     Nose: Nose normal.  Eyes:     Conjunctiva/sclera: Conjunctivae normal.  Cardiovascular:     Rate and Rhythm: Normal rate and regular rhythm.     Heart sounds: Normal heart sounds.  Pulmonary:     Effort: Pulmonary effort is normal.     Breath sounds: Normal breath sounds.  Abdominal:     General: Bowel sounds are normal.     Tenderness: There is right CVA tenderness and left CVA tenderness.  Genitourinary:    Vagina: No vaginal discharge.  Musculoskeletal:        General: Tenderness present. Normal range of motion.  Neurological:     Mental Status: She is alert and oriented to person, place, and time.  Psychiatric:        Mood and Affect: Mood normal.        Behavior: Behavior normal.     BP 139/88   Pulse 98   Temp 98.3 F (36.8 C)   Ht 5' 6"  (1.676 m)   Wt 175 lb 12.8 oz (79.7 kg)   SpO2 99%   BMI 28.37 kg/m  Wt Readings from Last 3 Encounters:  07/28/20 175 lb 12.8 oz (79.7 kg)  06/15/20 177 lb (80.3 kg)  03/15/20 172 lb 9.6 oz (78.3 kg)    There are no preventive care reminders to display for this patient.  There are no preventive care  reminders to display for this patient.   Lab Results  Component Value Date   TSH 4.310 07/26/2018   Lab Results  Component Value Date   WBC 9.1  07/26/2018   HGB 14.1 07/26/2018   HCT 40.9 07/26/2018   MCV 92 07/26/2018   PLT 357 07/26/2018   Lab Results  Component Value Date   NA 141 09/26/2019   K 4.5 09/26/2019   CO2 24 09/26/2019   GLUCOSE 110 (H) 09/26/2019   BUN 12 09/26/2019   CREATININE 0.92 09/26/2019   BILITOT 0.4 07/26/2018   ALKPHOS 113 07/26/2018   AST 19 07/26/2018   ALT 20 07/26/2018   PROT 6.7 07/26/2018   ALBUMIN 4.4 07/26/2018   CALCIUM 9.2 09/26/2019   ANIONGAP 10 04/12/2018   Lab Results  Component Value Date   CHOL 196 07/26/2018   Lab Results  Component Value Date   HDL 52 07/26/2018   Lab Results  Component Value Date   LDLCALC 101 (H) 07/26/2018   Lab Results  Component Value Date   TRIG 217 (H) 07/26/2018   Lab Results  Component Value Date   CHOLHDL 3.8 07/26/2018   Lab Results  Component Value Date   HGBA1C 6.3 07/26/2018       Assessment & Plan:   Problem List Items Addressed This Visit      Genitourinary   Pyelonephritis - Primary    Patient is reporting symptoms of dysuria, frequency, flank pain, nausea, low-grade fever of 99.9 in 24 hours.  Symptoms have lasted for a week and a half.  Pelvic pressure and pain with pain rating of 8 out of 10 on a pain scale of 0-10.  Patient also reports having 13 urination in the last 24 hours. Patient has used nothing for pain or symptoms. Completed urinalysis with culture results pending.  Rocephin shot given in clinic, started patient on Bactrim DS.  Education provided to patient with printed handouts given. Follow-up for worsening unresolved symptoms.      Relevant Medications   sulfamethoxazole-trimethoprim (BACTRIM DS) 800-160 MG tablet   Other Relevant Orders   Urinalysis, Complete   Urine Culture       Meds ordered this encounter  Medications  .  sulfamethoxazole-trimethoprim (BACTRIM DS) 800-160 MG tablet    Sig: Take 1 tablet by mouth 2 (two) times daily.    Dispense:  10 tablet    Refill:  0    Order Specific Question:   Supervising Provider    Answer:   Janora Norlander [[id number]]  . cefTRIAXone (ROCEPHIN) injection 1 g     Ivy Lynn, NP

## 2020-07-28 NOTE — Assessment & Plan Note (Signed)
Patient is reporting symptoms of dysuria, frequency, flank pain, nausea, low-grade fever of 99.9 in 24 hours.  Symptoms have lasted for a week and a half.  Pelvic pressure and pain with pain rating of 8 out of 10 on a pain scale of 0-10.  Patient also reports having 13 urination in the last 24 hours. Patient has used nothing for pain or symptoms. Completed urinalysis with culture results pending.  Rocephin shot given in clinic, started patient on Bactrim DS.  Education provided to patient with printed handouts given. Follow-up for worsening unresolved symptoms.

## 2020-07-31 LAB — URINE CULTURE

## 2020-08-03 ENCOUNTER — Encounter: Payer: Self-pay | Admitting: Nurse Practitioner

## 2020-08-03 ENCOUNTER — Ambulatory Visit (INDEPENDENT_AMBULATORY_CARE_PROVIDER_SITE_OTHER): Payer: Medicare HMO | Admitting: Nurse Practitioner

## 2020-08-03 ENCOUNTER — Other Ambulatory Visit: Payer: Self-pay

## 2020-08-03 VITALS — BP 136/78 | HR 91 | Temp 97.4°F | Ht 66.0 in | Wt 176.2 lb

## 2020-08-03 DIAGNOSIS — R300 Dysuria: Secondary | ICD-10-CM | POA: Insufficient documentation

## 2020-08-03 LAB — MICROSCOPIC EXAMINATION: RBC, Urine: NONE SEEN /hpf (ref 0–2)

## 2020-08-03 LAB — URINALYSIS, COMPLETE
Bilirubin, UA: NEGATIVE
Glucose, UA: NEGATIVE
Ketones, UA: NEGATIVE
Nitrite, UA: NEGATIVE
Protein,UA: NEGATIVE
RBC, UA: NEGATIVE
Specific Gravity, UA: 1.01 (ref 1.005–1.030)
Urobilinogen, Ur: 0.2 mg/dL (ref 0.2–1.0)
pH, UA: 5 (ref 5.0–7.5)

## 2020-08-03 MED ORDER — CIPROFLOXACIN HCL 500 MG PO TABS
500.0000 mg | ORAL_TABLET | Freq: Two times a day (BID) | ORAL | 0 refills | Status: DC
Start: 2020-08-03 — End: 2020-09-13

## 2020-08-03 NOTE — Assessment & Plan Note (Signed)
Symptoms not well controlled. Patient was treated for UTI in the past 7 days. With Bactrim DS for 5 days. Patient is reporting better symptoms but continued flank pain. Urine cultures resulted with staph and sensitive to Cipro. Started patient on ciprofloxacin 500 mg twice daily for 7 days.  Follow-up with worsening unresolved symptoms  Education provided to patient with printed handouts given.  Rx sent to pharmacy.

## 2020-08-03 NOTE — Progress Notes (Signed)
Acute Office Visit  Subjective:    Patient ID: [name], female    DOB: 1955/08/29, 65 y.o.   MRN: [id number]  Chief Complaint  Patient presents with  . Dysuria    Urinary Tract Infection  This is a recurrent problem. The problem occurs intermittently. The problem has been unchanged. The quality of the pain is described as aching. The pain is moderate. There has been no fever. There is a history of pyelonephritis. Associated symptoms include flank pain. Pertinent negatives include no chills, hesitancy or nausea. She has tried antibiotics for the symptoms. The treatment provided mild relief.    Past Medical History:  Diagnosis Date  . Anxiety   . Arthritis   . GERD (gastroesophageal reflux disease)   . Hyperlipidemia   . Hypertension     Past Surgical History:  Procedure Laterality Date  . ABDOMINAL HYSTERECTOMY    . CERVICAL FUSION    . KNEE ARTHROSCOPY Left   . LUMBAR FUSION    . skin cancer removal  09/25/2019   right thigh    Family History  Problem Relation Age of Onset  . Congestive Heart Failure Mother   . Arthritis Mother   . Cancer Father        lung  . Arthritis/Rheumatoid Maternal Grandmother   . Cancer Paternal Grandfather        skin    Social History   Socioeconomic History  . Marital status: Married    Spouse name: Not on file  . Number of children: Not on file  . Years of education: Not on file  . Highest education level: Not on file  Occupational History  . Not on file  Tobacco Use  . Smoking status: Never Smoker  . Smokeless tobacco: Never Used  Vaping Use  . Vaping Use: Never used  Substance and Sexual Activity  . Alcohol use: No  . Drug use: No  . Sexual activity: Not on file  Other Topics Concern  . Not on file  Social History Narrative  . Not on file   Social Determinants of Health   Financial Resource Strain: Not on file  Food Insecurity: Not on file  Transportation Needs: Not on file  Physical Activity: Not on  file  Stress: Not on file  Social Connections: Not on file  Intimate Partner Violence: Not on file    Outpatient Medications Prior to Visit  Medication Sig Dispense Refill  . alendronate (FOSAMAX) 70 MG tablet Take 1 tablet (70 mg total) by mouth every 7 (seven) days. Take with a full glass of water on an empty stomach. 4 tablet 11  . amitriptyline (ELAVIL) 100 MG tablet Take 0.5-1 tablets (50-100 mg total) by mouth at bedtime. Place on hold 90 tablet 3  . aspirin 81 MG chewable tablet Chew 81 mg by mouth daily.    Marland Kitchen atenolol (TENORMIN) 25 MG tablet Take 25 mg by mouth daily.   2  . cholecalciferol (VITAMIN D3) 25 MCG (1000 UT) tablet Take 1,000 Units by mouth daily.    . diclofenac Sodium (VOLTAREN) 1 % GEL Apply 4 g topically 4 (four) times daily as needed. 20 g 0  . furosemide (LASIX) 20 MG tablet TAKE 0.5-1 TABLETS (10-20 MG TOTAL) BY MOUTH DAILY AS NEEDED FOR FLUID OR EDEMA. 30 tablet 1  . isosorbide mononitrate (IMDUR) 60 MG 24 hr tablet Take 60 mg by mouth daily.    Marland Kitchen NARCAN 4 MG/0.1ML LIQD nasal spray kit     .  nitroGLYCERIN (NITROSTAT) 0.4 MG SL tablet See admin instructions.    Derrill Memo ON 08/21/2020] oxyCODONE-acetaminophen (PERCOCET) 10-325 MG tablet Take 1 tablet by mouth every 8 (eight) hours as needed for pain. 90 tablet 0  . oxyCODONE-acetaminophen (PERCOCET) 10-325 MG tablet Take 1 tablet by mouth every 8 (eight) hours as needed for pain. 90 tablet 0  . oxyCODONE-acetaminophen (PERCOCET) 10-325 MG tablet Take 1 tablet by mouth every 8 (eight) hours as needed for pain. 90 tablet 0  . rosuvastatin (CRESTOR) 5 MG tablet Take 5 mg by mouth daily.    Marland Kitchen topiramate (TOPAMAX) 25 MG tablet Take 1 tablet (25 mg total) by mouth daily for 30 days, THEN 0.5 tablets (12.5 mg total) daily for 30 days, THEN 0.5 tablets (12.5 mg total) every other day for 14 days. 50 tablet 0  . vitamin B-12 (CYANOCOBALAMIN) 100 MCG tablet Take 100 mcg by mouth daily.    . vitamin C (ASCORBIC ACID) 500 MG  tablet Take 500 mg by mouth daily.    Marland Kitchen zolpidem (AMBIEN) 5 MG tablet Take 1 tablet (5 mg total) by mouth at bedtime as needed for sleep. 30 tablet 5  . sulfamethoxazole-trimethoprim (BACTRIM DS) 800-160 MG tablet Take 1 tablet by mouth 2 (two) times daily. 10 tablet 0   No facility-administered medications prior to visit.    Allergies  Allergen Reactions  . Gabapentin     Dizziness and blurred vision    Review of Systems  Constitutional: Negative for chills.  HENT: Negative.   Cardiovascular: Negative.   Gastrointestinal: Negative for nausea.  Genitourinary: Positive for flank pain. Negative for hesitancy.  Musculoskeletal:       Flank pain  Skin: Negative.   All other systems reviewed and are negative.      Objective:    Physical Exam Vitals reviewed.  Constitutional:      Appearance: Normal appearance.  HENT:     Head: Normocephalic.     Nose: Nose normal.  Eyes:     Conjunctiva/sclera: Conjunctivae normal.  Cardiovascular:     Rate and Rhythm: Normal rate and regular rhythm.     Pulses: Normal pulses.     Heart sounds: Normal heart sounds.  Pulmonary:     Effort: Pulmonary effort is normal.     Breath sounds: Normal breath sounds.  Abdominal:     General: Bowel sounds are normal.     Tenderness: There is abdominal tenderness. There is right CVA tenderness and left CVA tenderness.  Musculoskeletal:        General: Tenderness present.  Skin:    General: Skin is warm.  Neurological:     Mental Status: She is alert and oriented to person, place, and time.  Psychiatric:        Mood and Affect: Mood normal.        Behavior: Behavior normal.     BP 136/78   Pulse 91   Temp (!) 97.4 F (36.3 C)   Ht 5' 6"  (1.676 m)   Wt 176 lb 3.2 oz (79.9 kg)   SpO2 99%   BMI 28.44 kg/m  Wt Readings from Last 3 Encounters:  08/03/20 176 lb 3.2 oz (79.9 kg)  07/28/20 175 lb 12.8 oz (79.7 kg)  06/15/20 177 lb (80.3 kg)    There are no preventive care reminders to  display for this patient.  There are no preventive care reminders to display for this patient.   Lab Results  Component Value Date   TSH  4.310 07/26/2018   Lab Results  Component Value Date   WBC 9.1 07/26/2018   HGB 14.1 07/26/2018   HCT 40.9 07/26/2018   MCV 92 07/26/2018   PLT 357 07/26/2018   Lab Results  Component Value Date   NA 141 09/26/2019   K 4.5 09/26/2019   CO2 24 09/26/2019   GLUCOSE 110 (H) 09/26/2019   BUN 12 09/26/2019   CREATININE 0.92 09/26/2019   BILITOT 0.4 07/26/2018   ALKPHOS 113 07/26/2018   AST 19 07/26/2018   ALT 20 07/26/2018   PROT 6.7 07/26/2018   ALBUMIN 4.4 07/26/2018   CALCIUM 9.2 09/26/2019   ANIONGAP 10 04/12/2018   Lab Results  Component Value Date   CHOL 196 07/26/2018   Lab Results  Component Value Date   HDL 52 07/26/2018   Lab Results  Component Value Date   LDLCALC 101 (H) 07/26/2018   Lab Results  Component Value Date   TRIG 217 (H) 07/26/2018   Lab Results  Component Value Date   CHOLHDL 3.8 07/26/2018   Lab Results  Component Value Date   HGBA1C 6.3 07/26/2018       Assessment & Plan:   Problem List Items Addressed This Visit      Other   Dysuria - Primary    Symptoms not well controlled. Patient was treated for UTI in the past 7 days. With Bactrim DS for 5 days. Patient is reporting better symptoms but continued flank pain. Urine cultures resulted with staph and sensitive to Cipro. Started patient on ciprofloxacin 500 mg twice daily for 7 days.  Follow-up with worsening unresolved symptoms  Education provided to patient with printed handouts given.  Rx sent to pharmacy.      Relevant Medications   ciprofloxacin (CIPRO) 500 MG tablet   Other Relevant Orders   Urinalysis, Complete (Completed)       Meds ordered this encounter  Medications  . ciprofloxacin (CIPRO) 500 MG tablet    Sig: Take 1 tablet (500 mg total) by mouth 2 (two) times daily.    Dispense:  14 tablet    Refill:  0     Order Specific Question:   Supervising Provider    Answer:   Janora Norlander [[id number]]     Ivy Lynn, NP

## 2020-08-03 NOTE — Patient Instructions (Signed)
Pyelonephritis, Adult    Pyelonephritis is an infection that occurs in the kidney. The kidneys are organs that help clean the blood by moving waste out of the blood and into the pee (urine). This infection can happen quickly, or it can last for a long time. In most cases, it clears up with treatment and does not cause other problems.  What are the causes?  This condition may be caused by:  · Germs (bacteria) going from the bladder up to the kidney. This may happen after having a bladder infection.  · Germs going from the blood to the kidney.  What increases the risk?  This condition is more likely to develop in:  · Pregnant women.  · Older people.  · People who have any of these conditions:  ? Diabetes.  ? Inflammation of the prostate gland (prostatitis), in males.  ? Kidney stones or bladder stones.  ? Other problems with the kidney or the parts of your body that carry pee from the kidneys to the bladder (ureters).  ? Cancer.  · People who have a small, thin tube (catheter) placed in the bladder.  · People who are sexually active.  · Women who use a medicine that kills sperm (spermicide) to prevent pregnancy.  · People who have had a prior urinary tract infection (UTI).  What are the signs or symptoms?  Symptoms of this condition include:  · Peeing often.  · A strong urge to pee right away.  · Burning or stinging when peeing.  · Belly pain.  · Back pain.  · Pain in the side (flank area).  · Fever or chills.  · Blood in the pee, or dark pee.  · Feeling sick to your stomach (nauseous) or throwing up (vomiting).  How is this treated?  This condition may be treated by:  · Taking antibiotic medicines by mouth (orally).  · Drinking enough fluids.  If the infection is bad, you may need to stay in the hospital. You may be given antibiotics and fluids that are put directly into a vein through an IV tube.  In some cases, other treatments may be needed.  Follow these instructions at home:  Medicines  · Take your antibiotic  medicine as told by your doctor. Do not stop taking the antibiotic even if you start to feel better.  · Take over-the-counter and prescription medicines only as told by your doctor.  General instructions    · Drink enough fluid to keep your pee pale yellow.  · Avoid caffeine, tea, and carbonated drinks.  · Pee (urinate) often. Avoid holding in pee for long periods of time.  · Pee before and after sex.  · After pooping (having a bowel movement), women should wipe from front to back. Use each tissue only once.  · Keep all follow-up visits as told by your doctor. This is important.  Contact a doctor if:  · You do not feel better after 2 days.  · Your symptoms get worse.  · You have a fever.  Get help right away if:  · You cannot take your medicine or drink fluids as told.  · You have chills and shaking.  · You throw up.  · You have very bad pain in your side or back.  · You feel very weak or you pass out (faint).  Summary  · Pyelonephritis is an infection that occurs in the kidney.  · In most cases, this infection clears up with treatment and does   not cause other problems.  · Take your antibiotic medicine as told by your doctor. Do not stop taking the antibiotic even if you start to feel better.  · Drink enough fluid to keep your pee pale yellow.  This information is not intended to replace advice given to you by your health care provider. Make sure you discuss any questions you have with your health care provider.  Document Revised: 03/26/2018 Document Reviewed: 03/26/2018  Elsevier Patient Education © 2021 [employer]

## 2020-08-24 ENCOUNTER — Other Ambulatory Visit: Payer: Self-pay | Admitting: Family Medicine

## 2020-09-04 DIAGNOSIS — B029 Zoster without complications: Secondary | ICD-10-CM | POA: Diagnosis not present

## 2020-09-13 ENCOUNTER — Other Ambulatory Visit: Payer: Self-pay

## 2020-09-13 ENCOUNTER — Encounter: Payer: Self-pay | Admitting: Family Medicine

## 2020-09-13 ENCOUNTER — Ambulatory Visit (INDEPENDENT_AMBULATORY_CARE_PROVIDER_SITE_OTHER): Payer: Medicare HMO | Admitting: Family Medicine

## 2020-09-13 VITALS — BP 112/74 | HR 95 | Temp 97.7°F | Ht 66.0 in | Wt 175.8 lb

## 2020-09-13 DIAGNOSIS — B0229 Other postherpetic nervous system involvement: Secondary | ICD-10-CM | POA: Diagnosis not present

## 2020-09-13 DIAGNOSIS — S22000D Wedge compression fracture of unspecified thoracic vertebra, subsequent encounter for fracture with routine healing: Secondary | ICD-10-CM | POA: Diagnosis not present

## 2020-09-13 DIAGNOSIS — B028 Zoster with other complications: Secondary | ICD-10-CM

## 2020-09-13 DIAGNOSIS — R69 Illness, unspecified: Secondary | ICD-10-CM | POA: Diagnosis not present

## 2020-09-13 DIAGNOSIS — M5136 Other intervertebral disc degeneration, lumbar region: Secondary | ICD-10-CM | POA: Diagnosis not present

## 2020-09-13 DIAGNOSIS — M4727 Other spondylosis with radiculopathy, lumbosacral region: Secondary | ICD-10-CM | POA: Diagnosis not present

## 2020-09-13 DIAGNOSIS — R739 Hyperglycemia, unspecified: Secondary | ICD-10-CM

## 2020-09-13 DIAGNOSIS — S22000A Wedge compression fracture of unspecified thoracic vertebra, initial encounter for closed fracture: Secondary | ICD-10-CM

## 2020-09-13 DIAGNOSIS — F5101 Primary insomnia: Secondary | ICD-10-CM

## 2020-09-13 MED ORDER — ZOLPIDEM TARTRATE 5 MG PO TABS: 5.0000 mg | ORAL_TABLET | Freq: Every evening | ORAL | 5 refills | Status: DC | PRN

## 2020-09-13 MED ORDER — OXYCODONE-ACETAMINOPHEN 10-325 MG PO TABS: 1.0000 | ORAL_TABLET | Freq: Three times a day (TID) | ORAL | 0 refills | Status: DC | PRN

## 2020-09-13 MED ORDER — LIDOCAINE 5 % EX OINT: 1.0000 "application " | TOPICAL_OINTMENT | Freq: Three times a day (TID) | CUTANEOUS | 0 refills | Status: DC | PRN

## 2020-09-13 MED ORDER — VALACYCLOVIR HCL 1 G PO TABS: 1000.0000 mg | ORAL_TABLET | Freq: Three times a day (TID) | ORAL | 0 refills | Status: AC

## 2020-09-13 NOTE — Progress Notes (Signed)
Subjective: CC: Chronic pain PCP: Janora Norlander, DO [id number] B Audia is a 65 y.o. female presenting to clinic today for:  1.  Chronic pain/ insomnia Patient with known chronic pain disorder secondary to degenerative changes in her spine.  At last visit she was having issues with her hip and there are plans for surgical intervention but she notes that this is totally resolved since that visit and that surgery has been canceled.  Albeit she does have quite severe arthritis in that right hip.  She has been using the Percocet 3 times daily fairly regularly, especially since she started having a shingles infection.  She denies any constipation, falls or dizziness with the medication.  Insomnia stable with nightly use of Ambien.  No excessive daytime sedation, falls.  No visual or auditory hallucinations.  2.  Shingles Patient reports that shingles rash that was diagnosed about 10 days ago.  She was only given 1 day of Valtrex to take as 2 g twice daily.  She is unfortunately intolerant to gabapentin due to dizziness and blurred vision.  She has been taking 100 mg of her Elavil at bedtime with some relief.  She tried using the capsaicin cream but this made the burning worse.  The rash extends from under her right breast to the right mid back.  No lesions elsewhere.   ROS: Per HPI  Allergies  Allergen Reactions  . Gabapentin     Dizziness and blurred vision   Past Medical History:  Diagnosis Date  . Anxiety   . Arthritis   . GERD (gastroesophageal reflux disease)   . Hyperlipidemia   . Hypertension     Current Outpatient Medications:  .  alendronate (FOSAMAX) 70 MG tablet, Take 1 tablet (70 mg total) by mouth every 7 (seven) days. Take with a full glass of water on an empty stomach., Disp: 4 tablet, Rfl: 11 .  amitriptyline (ELAVIL) 100 MG tablet, Take 0.5-1 tablets (50-100 mg total) by mouth at bedtime. Place on hold, Disp: 90 tablet, Rfl: 3 .  aspirin 81 MG chewable tablet,  Chew 81 mg by mouth daily., Disp: , Rfl:  .  atenolol (TENORMIN) 25 MG tablet, Take 25 mg by mouth daily. , Disp: , Rfl: 2 .  cholecalciferol (VITAMIN D3) 25 MCG (1000 UT) tablet, Take 1,000 Units by mouth daily., Disp: , Rfl:  .  isosorbide mononitrate (IMDUR) 60 MG 24 hr tablet, Take 60 mg by mouth daily., Disp: , Rfl:  .  nitroGLYCERIN (NITROSTAT) 0.4 MG SL tablet, See admin instructions., Disp: , Rfl:  .  oxyCODONE-acetaminophen (PERCOCET) 10-325 MG tablet, Take 1 tablet by mouth every 8 (eight) hours as needed for pain., Disp: 90 tablet, Rfl: 0 .  rosuvastatin (CRESTOR) 5 MG tablet, Take 5 mg by mouth daily., Disp: , Rfl:  .  vitamin C (ASCORBIC ACID) 500 MG tablet, Take 500 mg by mouth daily., Disp: , Rfl:  .  zolpidem (AMBIEN) 5 MG tablet, Take 1 tablet (5 mg total) by mouth at bedtime as needed for sleep., Disp: 30 tablet, Rfl: 5 .  diclofenac Sodium (VOLTAREN) 1 % GEL, Apply 4 g topically 4 (four) times daily as needed. (Patient not taking: Reported on 09/13/2020), Disp: 20 g, Rfl: 0 .  furosemide (LASIX) 20 MG tablet, TAKE 0.5-1 TABLETS (10-20 MG TOTAL) BY MOUTH DAILY AS NEEDED FOR FLUID OR EDEMA. (Patient not taking: Reported on 09/13/2020), Disp: 30 tablet, Rfl: 1 .  NARCAN 4 MG/0.1ML LIQD nasal spray kit, ,  Disp: , Rfl:  .  oxyCODONE-acetaminophen (PERCOCET) 10-325 MG tablet, Take 1 tablet by mouth every 8 (eight) hours as needed for pain. (Patient not taking: Reported on 09/13/2020), Disp: 90 tablet, Rfl: 0 .  oxyCODONE-acetaminophen (PERCOCET) 10-325 MG tablet, Take 1 tablet by mouth every 8 (eight) hours as needed for pain. (Patient not taking: Reported on 09/13/2020), Disp: 90 tablet, Rfl: 0 .  vitamin B-12 (CYANOCOBALAMIN) 100 MCG tablet, Take 100 mcg by mouth daily. (Patient not taking: Reported on 09/13/2020), Disp: , Rfl:  Social History   Socioeconomic History  . Marital status: Married    Spouse name: Not on file  . Number of children: Not on file  . Years of education: Not on  file  . Highest education level: Not on file  Occupational History  . Not on file  Tobacco Use  . Smoking status: Never Smoker  . Smokeless tobacco: Never Used  Vaping Use  . Vaping Use: Never used  Substance and Sexual Activity  . Alcohol use: No  . Drug use: No  . Sexual activity: Not on file  Other Topics Concern  . Not on file  Social History Narrative  . Not on file   Social Determinants of Health   Financial Resource Strain: Not on file  Food Insecurity: Not on file  Transportation Needs: Not on file  Physical Activity: Not on file  Stress: Not on file  Social Connections: Not on file  Intimate Partner Violence: Not on file   Family History  Problem Relation Age of Onset  . Congestive Heart Failure Mother   . Arthritis Mother   . Cancer Father        lung  . Arthritis/Rheumatoid Maternal Grandmother   . Cancer Paternal Grandfather        skin    Objective: Office vital signs reviewed. BP 112/74   Pulse 95   Temp 97.7 F (36.5 C)   Ht _0  (1.676 m)   Wt 175 lb 12.8 oz (79.7 kg)   SpO2 96%   BMI 28.37 kg/m   Physical Examination:  General: Awake, alert, No acute distress HEENT: Normal; sclera white Cardio: regular rate and rhythm, S1S2 heard, no murmurs appreciated Pulm: clear to auscultation bilaterally, no wheezes, rhonchi or rales; normal work of breathing on room air MSK: Ambulating independently Skin: Erythematous rash extending from the right inferior breast to the right Thoracics and a T6 dermatomal pattern noted.  No exudate.  It is tender to touch  Assessment/ Plan: 65 y.o. female   Herpes zoster with other complication - Plan: valACYclovir (VALTREX) 1000 MG tablet, lidocaine (XYLOCAINE) 5 % ointment  Post herpetic neuralgia - Plan: lidocaine (XYLOCAINE) 5 % ointment  Elevated serum glucose - Plan: CMP14+EGFR, Bayer DCA Hb A1c Waived  DDD (degenerative disc disease), lumbar - Plan: oxyCODONE-acetaminophen (PERCOCET) 10-325 MG tablet,  oxyCODONE-acetaminophen (PERCOCET) 10-325 MG tablet, oxyCODONE-acetaminophen (PERCOCET) 10-325 MG tablet  Osteoarthritis of spine with radiculopathy, lumbosacral region - Plan: oxyCODONE-acetaminophen (PERCOCET) 10-325 MG tablet, oxyCODONE-acetaminophen (PERCOCET) 10-325 MG tablet, oxyCODONE-acetaminophen (PERCOCET) 10-325 MG tablet  Compression fracture of body of thoracic vertebra (HCC) - Plan: oxyCODONE-acetaminophen (PERCOCET) 10-325 MG tablet, oxyCODONE-acetaminophen (PERCOCET) 10-325 MG tablet, oxyCODONE-acetaminophen (PERCOCET) 10-325 MG tablet  Primary insomnia - Plan: zolpidem (AMBIEN) 5 MG tablet  I have treated her with Valtrex 3 times daily for the next 10 days given the extent of her rash but we did discuss that since she is out of the 72 hour window that this may not  be as effective as it could have been.  I have given her Xylocaine to have topically.  Caution use of this.  We discussed potential for arrhythmia and other complications with excessive use.  She unfortunately is not able to tolerate gabapentin.  We did briefly discuss potential for Lyrica but we did discuss the fact that this is in the same class.  Her chronic pain is stable. She is on 46.5 MME/ day. Last fill 08/21/2020. The Narcotic Database has been reviewed.  There were no red flags.  Controlled substance contract was updated today but she was not yet due for UDS  She has had some radiation of the pain to the right upper quadrant of the abdomen.  She worries that potentially this could be related to gallbladder as her gallbladder was "noted to be distended" on previous imaging.  She does not report any fevers or other concerning symptoms.  We will collect a CMP for further evaluation.  Insomnia stable with Ambien.  Renal has been sent  No orders of the defined types were placed in this encounter.  Meds ordered this encounter  Medications  . valACYclovir (VALTREX) 1000 MG tablet    Sig: Take 1 tablet (1,000 mg  total) by mouth 3 (three) times daily for 10 days.    Dispense:  30 tablet    Refill:  0  . oxyCODONE-acetaminophen (PERCOCET) 10-325 MG tablet    Sig: Take 1 tablet by mouth every 8 (eight) hours as needed for pain.    Dispense:  90 tablet    Refill:  0  . oxyCODONE-acetaminophen (PERCOCET) 10-325 MG tablet    Sig: Take 1 tablet by mouth every 8 (eight) hours as needed for pain.    Dispense:  90 tablet    Refill:  0  . oxyCODONE-acetaminophen (PERCOCET) 10-325 MG tablet    Sig: Take 1 tablet by mouth every 8 (eight) hours as needed for pain.    Dispense:  90 tablet    Refill:  0  . zolpidem (AMBIEN) 5 MG tablet    Sig: Take 1 tablet (5 mg total) by mouth at bedtime as needed for sleep.    Dispense:  30 tablet    Refill:  5  . lidocaine (XYLOCAINE) 5 % ointment    Sig: Apply 1 application topically 3 (three) times daily as needed for moderate pain.    Dispense:  50 g    Refill:  [place], DO [place] [phone]

## 2020-09-14 ENCOUNTER — Other Ambulatory Visit: Payer: Self-pay | Admitting: Family Medicine

## 2020-09-14 ENCOUNTER — Telehealth: Payer: Self-pay

## 2020-09-14 NOTE — Telephone Encounter (Signed)
Drug Lidocaine 5% ointment  Key: [id number] - PA Case ID: [id number] - Rx #: [id number]  Sent to plan

## 2020-09-14 NOTE — Telephone Encounter (Signed)
outcome Approvedtoday Your request has been approved Drug Lidocaine 5% ointment Form [employer] Electronic PA Form (2017 NCPDP) Original Claim Info 05,397 PRECERT REQ BY MD [phone] REQUIRED MD CALL [phone] REQUIRES PRIOR AUTHORIZATION

## 2020-09-14 NOTE — Telephone Encounter (Signed)
Pharmacy aware

## 2020-10-08 DIAGNOSIS — I10 Essential (primary) hypertension: Secondary | ICD-10-CM | POA: Diagnosis not present

## 2020-10-08 DIAGNOSIS — D472 Monoclonal gammopathy: Secondary | ICD-10-CM | POA: Diagnosis not present

## 2020-10-08 DIAGNOSIS — E785 Hyperlipidemia, unspecified: Secondary | ICD-10-CM | POA: Diagnosis not present

## 2020-10-08 DIAGNOSIS — R7303 Prediabetes: Secondary | ICD-10-CM | POA: Diagnosis not present

## 2020-10-08 DIAGNOSIS — I25111 Atherosclerotic heart disease of native coronary artery with angina pectoris with documented spasm: Secondary | ICD-10-CM | POA: Diagnosis not present

## 2020-10-18 ENCOUNTER — Ambulatory Visit (INDEPENDENT_AMBULATORY_CARE_PROVIDER_SITE_OTHER): Payer: Medicare HMO

## 2020-10-18 VITALS — Ht 66.0 in | Wt 172.0 lb

## 2020-10-18 DIAGNOSIS — Z0000 Encounter for general adult medical examination without abnormal findings: Secondary | ICD-10-CM | POA: Diagnosis not present

## 2020-10-18 NOTE — Progress Notes (Signed)
Subjective:   [name] is a 65 y.o. female who presents for Medicare Annual (Subsequent) preventive examination.  Virtual Visit via Telephone Note  I connected with  [name] on 10/18/20 at  3:30 PM EDT by telephone and verified that I am speaking with the correct person using two identifiers.  Location: Patient: Home Provider: [facility] Persons participating in the virtual visit: patient/Nurse Health Advisor   I discussed the limitations, risks, security and privacy concerns of performing an evaluation and management service by telephone and the availability of in person appointments. The patient expressed understanding and agreed to proceed.  Interactive audio and video telecommunications were attempted between this nurse and patient, however failed, due to patient having technical difficulties OR patient did not have access to video capability.  We continued and completed visit with audio only.  Some vital signs may be absent or patient reported.   Kristin Pettet E Duchess Armendarez, LPN   Review of Systems     Cardiac Risk Factors include: advanced age (>7mn, >>37women);sedentary lifestyle;dyslipidemia     Objective:    Today's Vitals   10/18/20 1615  Weight: 172 lb (78 kg)  Height: _0  (1.676 m)  PainSc: 7    Body mass index is 27.76 kg/m.  Advanced Directives 10/18/2020 04/12/2018 01/10/2018  Does Patient Have a Medical Advance Directive? No No No  Would patient like information on creating a medical advance directive? No - Patient declined - No - Patient declined    Current Medications (verified) Outpatient Encounter Medications as of 10/18/2020  Medication Sig  . alendronate (FOSAMAX) 70 MG tablet Take 1 tablet (70 mg total) by mouth every 7 (seven) days. Take with a full glass of water on an empty stomach.  .Marland Kitchenamitriptyline (ELAVIL) 100 MG tablet Take 0.5-1 tablets (50-100 mg total) by mouth at bedtime. Place on hold  . aspirin 81 MG chewable tablet Chew 81 mg by mouth  daily.  .Marland Kitchenatenolol (TENORMIN) 25 MG tablet Take 25 mg by mouth daily.   . cholecalciferol (VITAMIN D3) 25 MCG (1000 UT) tablet Take 1,000 Units by mouth daily.  . diclofenac Sodium (VOLTAREN) 1 % GEL Apply 4 g topically 4 (four) times daily as needed. (Patient not taking: Reported on 09/13/2020)  . furosemide (LASIX) 20 MG tablet TAKE 1/2 TO 1 TABLET BY MOUTH ONCE DAILY AS NEEDED FOR FLUID OF EDEMA  . isosorbide mononitrate (IMDUR) 60 MG 24 hr tablet Take 60 mg by mouth daily.  .Marland Kitchenlidocaine (XYLOCAINE) 5 % ointment Apply 1 application topically 3 (three) times daily as needed for moderate pain.  .Marland KitchenNARCAN 4 MG/0.1ML LIQD nasal spray kit  (Patient not taking: Reported on 09/13/2020)  . nitroGLYCERIN (NITROSTAT) 0.4 MG SL tablet See admin instructions.  .Derrill MemoON 11/19/2020] oxyCODONE-acetaminophen (PERCOCET) 10-325 MG tablet Take 1 tablet by mouth every 8 (eight) hours as needed for pain.  .Derrill MemoON 10/20/2020] oxyCODONE-acetaminophen (PERCOCET) 10-325 MG tablet Take 1 tablet by mouth every 8 (eight) hours as needed for pain.  .Marland KitchenoxyCODONE-acetaminophen (PERCOCET) 10-325 MG tablet Take 1 tablet by mouth every 8 (eight) hours as needed for pain.  . rosuvastatin (CRESTOR) 5 MG tablet Take 5 mg by mouth daily.  .Marland Kitchentriamcinolone cream (KENALOG) 0.1 % Apply topically 2 (two) times daily.  . vitamin B-12 (CYANOCOBALAMIN) 100 MCG tablet Take 100 mcg by mouth daily. (Patient not taking: Reported on 09/13/2020)  . vitamin C (ASCORBIC ACID) 500 MG tablet Take 500 mg by mouth daily.  .Marland Kitchen  zolpidem (AMBIEN) 5 MG tablet Take 1 tablet (5 mg total) by mouth at bedtime as needed for sleep.   No facility-administered encounter medications on file as of 10/18/2020.    Allergies (verified) Gabapentin   History: Past Medical History:  Diagnosis Date  . Anxiety   . Arthritis   . GERD (gastroesophageal reflux disease)   . Hyperlipidemia   . Hypertension    Past Surgical History:  Procedure Laterality Date  .  ABDOMINAL HYSTERECTOMY    . CERVICAL FUSION    . KNEE ARTHROSCOPY Left   . LUMBAR FUSION    . skin cancer removal  09/25/2019   right thigh   Family History  Problem Relation Age of Onset  . Congestive Heart Failure Mother   . Arthritis Mother   . Cancer Father        lung  . Arthritis/Rheumatoid Maternal Grandmother   . Cancer Paternal Grandfather        skin   Social History   Socioeconomic History  . Marital status: Married    Spouse name: Not on file  . Number of children: 2  . Years of education: Not on file  . Highest education level: Not on file  Occupational History  . Occupation: disability  Tobacco Use  . Smoking status: Never Smoker  . Smokeless tobacco: Never Used  Vaping Use  . Vaping Use: Never used  Substance and Sexual Activity  . Alcohol use: No  . Drug use: No  . Sexual activity: Not on file  Other Topics Concern  . Not on file  Social History Narrative   Lives with her husband. Children and grandchildren live within 71 miles   Social Determinants of Health   Financial Resource Strain: Low Risk   . Difficulty of Paying Living Expenses: Not hard at all  Food Insecurity: No Food Insecurity  . Worried About Charity fundraiser in the Last Year: Never true  . Ran Out of Food in the Last Year: Never true  Transportation Needs: No Transportation Needs  . Lack of Transportation (Medical): No  . Lack of Transportation (Non-Medical): No  Physical Activity: Insufficiently Active  . Days of Exercise per Week: 7 days  . Minutes of Exercise per Session: 10 min  Stress: No Stress Concern Present  . Feeling of Stress : Only a little  Social Connections: Moderately Isolated  . Frequency of Communication with Friends and Family: More than three times a week  . Frequency of Social Gatherings with Friends and Family: More than three times a week  . Attends Religious Services: Never  . Active Member of Clubs or Organizations: No  . Attends Theatre manager Meetings: Never  . Marital Status: Married    Tobacco Counseling Counseling given: Not Answered   Clinical Intake:  Pre-visit preparation completed: Yes  Pain : 0-10 Pain Score: 7  Pain Type: Chronic pain,Neuropathic pain Pain Location: Generalized Pain Descriptors / Indicators: Aching Pain Onset: More than a month ago Pain Frequency: Intermittent     BMI - recorded: 27.76 Nutritional Status: BMI 25 -29 Overweight Nutritional Risks: None Diabetes: No  How often do you need to have someone help you when you read instructions, pamphlets, or other written materials from your doctor or pharmacy?: 1 - Never  Diabetic? no  Interpreter Needed?: No  Information entered by :: Yash Cacciola,LPN   Activities of Daily Living In your present state of health, do you have any difficulty performing the following activities: 10/18/2020  Hearing? N  Vision? N  Difficulty concentrating or making decisions? N  Walking or climbing stairs? Y  Dressing or bathing? N  Doing errands, shopping? N  Preparing Food and eating ? N  Using the Toilet? N  In the past six months, have you accidently leaked urine? N  Do you have problems with loss of bowel control? N  Managing your Medications? N  Managing your Finances? N  Housekeeping or managing your Housekeeping? N  Some recent data might be hidden    Patient Care Team: Janora Norlander, DO as PCP - General (Family Medicine) Lavera Guise, [facility] (Pharmacist) [place], Jennefer Bravo, MD as Referring Physician (Dermatology) Harlen Labs, MD as Referring Physician (Optometry) Charolette Forward, MD as Consulting Physician (Cardiology)  Indicate any recent Medical Services you may have received from other than [facility] providers in the past year (date may be approximate).     Assessment:   This is a routine wellness examination for Roff.  Hearing/Vision screen  Hearing Screening   _0  _1  _2  _3  _4  _5   _6  _7  _8   Right ear:           Left ear:           Comments: Denies hearing difficulties  Vision Screening Comments: Wears reading glasses only - Annual visits with Dr Marin Comment - up to date with eye exams.  Dietary issues and exercise activities discussed: Current Exercise Habits: Home exercise routine, Type of exercise: walking, Time (Minutes): 10, Frequency (Times/Week): 7, Weekly Exercise (Minutes/Week): 70, Intensity: Mild, Exercise limited by: neurologic condition(s);orthopedic condition(s)  Goals Addressed   None    Depression Screen PHQ 2/9 Scores 10/18/2020 09/13/2020 08/03/2020 07/28/2020 06/15/2020 03/15/2020 12/29/2019  PHQ - 2 Score 0 0 0 0 0 0 0  PHQ- 9 Score - - - - 0 - 0    Fall Risk Fall Risk  10/18/2020 09/13/2020 08/03/2020 07/28/2020 06/15/2020  Falls in the past year? 0 0 0 0 0  Number falls in past yr: 0 - - - -  Injury with Fall? 0 - - - -  Risk for fall due to : Orthopedic patient;Impaired balance/gait;Medication side effect - - - -  Follow up Education provided;Falls prevention discussed - - - -    FALL RISK PREVENTION PERTAINING TO THE HOME:  Any stairs in or around the home? Yes  If so, are there any without handrails? No  Home free of loose throw rugs in walkways, pet beds, electrical cords, etc? Yes  Adequate lighting in your home to reduce risk of falls? Yes   ASSISTIVE DEVICES UTILIZED TO PREVENT FALLS:  Life alert? No  Use of a cane, walker or w/c? Yes  Grab bars in the bathroom? Yes  Shower chair or bench in shower? No  Elevated toilet seat or a handicapped toilet? No   TIMED UP AND GO:  Was the test performed? No . Telephonic visit.  Cognitive Function:     6CIT Screen 10/18/2020  What Year? 0 points  What month? 0 points  What time? 0 points  Count back from 20 0 points  Months in reverse 0 points  Repeat phrase 0 points  Total Score 0    Immunizations Immunization History  Administered Date(s) Administered  . Fluad Quad(high  Dose 65+) 02/28/2020  . Influenza, Seasonal, Injecte, Preservative Fre 02/18/2014  . Influenza,inj,Quad PF,6+ Mos 02/22/2017, 03/22/2018, 02/18/2019  . Moderna Sars-Covid-2 Vaccination 06/21/2019, 07/22/2019, 04/07/2020  . Pneumococcal Conjugate-13 03/15/2020  . Tdap  02/10/2011    TDAP status: Up to date  Flu Vaccine status: Due, Education has been provided regarding the importance of this vaccine. Advised may receive this vaccine at local pharmacy or Health Dept. Aware to provide a copy of the vaccination record if obtained from local pharmacy or Health Dept. Verbalized acceptance and understanding.  Pneumococcal vaccine status: Up to date  Covid-19 vaccine status: Completed vaccines  Qualifies for Shingles Vaccine? Yes   Zostavax completed No   Shingrix Completed?: No.    Education has been provided regarding the importance of this vaccine. Patient has been advised to call insurance company to determine out of pocket expense if they have not yet received this vaccine. Advised may also receive vaccine at local pharmacy or Health Dept. Verbalized acceptance and understanding.  Screening Tests Health Maintenance  Topic Date Due  . MAMMOGRAM  01/17/2020  . COVID-19 Vaccine (4 - Booster for Moderna series) 07/08/2020  . INFLUENZA VACCINE  01/03/2021  . TETANUS/TDAP  02/09/2021  . PNA vac Low Risk Adult (2 of 2 - PPSV23) 03/15/2021  . DEXA SCAN  03/15/2022  . Hepatitis C Screening  Completed  . HIV Screening  Completed  . HPV VACCINES  Aged Out  . PAP SMEAR-Modifier  Discontinued  . COLONOSCOPY (Pts 45-79yr Insurance coverage will need to be confirmed)  Discontinued    Health Maintenance  Health Maintenance Due  Topic Date Due  . MAMMOGRAM  01/17/2020  . COVID-19 Vaccine (4 - Booster for Moderna series) 07/08/2020    Colorectal cancer screening: No longer required.  patient declines  Mammogram status: Completed 01/17/2019. Repeat every year has shingles pain right now - will  schedule when resolved  Bone Density status: Completed 03/15/2020. Results reflect: Bone density results: OSTEOPENIA. Repeat every 2 years.  Lung Cancer Screening: (Low Dose CT Chest recommended if Age 66-80years, 30 pack-year currently smoking OR have quit w/in 15years.) does not qualify.   Additional Screening:  Hepatitis C Screening: does qualify; Completed 09/26/2019  Vision Screening: Recommended annual ophthalmology exams for early detection of glaucoma and other disorders of the eye. Is the patient up to date with their annual eye exam?  Yes  Who is the provider or what is the name of the office in which the patient attends annual eye exams? [place] pt is not established with a provider, would they like to be referred to a provider to establish care? No .   Dental Screening: Recommended annual dental exams for proper oral hygiene  Community Resource Referral / Chronic Care Management: CRR required this visit?  No   CCM required this visit?  No      Plan:     I have personally reviewed and noted the following in the patient's chart:   . Medical and social history . Use of alcohol, tobacco or illicit drugs  . Current medications and supplements including opioid prescriptions.  . Functional ability and status . Nutritional status . Physical activity . Advanced directives . List of other physicians . Hospitalizations, surgeries, and ER visits in previous 12 months . Vitals . Screenings to include cognitive, depression, and falls . Referrals and appointments  In addition, I have reviewed and discussed with patient certain preventive protocols, quality metrics, and best practice recommendations. A written personalized care plan for preventive services as well as general preventive health recommendations were provided to patient.     ASandrea Hammond LPN   [id number]  Nurse Notes: Dx with  shingles in April - still having nerve pain around shoulder, back and  breast - will schedule mammogram and vaccines when resolved.

## 2020-10-18 NOTE — Patient Instructions (Signed)
[name] , Thank you for taking time to come for your Medicare Wellness Visit. I appreciate your ongoing commitment to your health goals. Please review the following plan we discussed and let me know if I can assist you in the future.   Screening recommendations/referrals: Colonoscopy: Declined Mammogram: Done 01/17/2019 - repeat annually *Schedule when shingles pain is better Bone Density: Done 03/15/2020 - Repeat every 2 years Recommended yearly ophthalmology/optometry visit for glaucoma screening and checkup Recommended yearly dental visit for hygiene and checkup  Vaccinations: Influenza vaccine: Due in fall 2022 Pneumococcal vaccine: Done 03/15/2020; get next one in a year Tdap vaccine: Done 02/10/2011 - Repeat in 10 years (due this fall 2022) Shingles vaccine: DUE Shingrix discussed. Please contact your pharmacy for coverage information when your nerve pain has resolved   Covid-19: Done 06/21/19, 07/22/19, 04/07/20 - due for second booster when shingles has resolved  Advanced directives: Please bring a copy of your health care power of attorney and living will to the office to be added to your chart at your convenience.  Conditions/risks identified: Aim for 30 minutes of exercise each day, drink 6-8 glasses of water and eat lots of fruits and vegetables. Continue fall prevention.  Next appointment: Follow up in one year for your annual wellness visit    Preventive Care 65 Years and Older, Female Preventive care refers to lifestyle choices and visits with your health care provider that can promote health and wellness. What does preventive care include?  A yearly physical exam. This is also called an annual well check.  Dental exams once or twice a year.  Routine eye exams. Ask your health care provider how often you should have your eyes checked.  Personal lifestyle choices, including:  Daily care of your teeth and gums.  Regular physical activity.  Eating a healthy  diet.  Avoiding tobacco and drug use.  Limiting alcohol use.  Practicing safe sex.  Taking low-dose aspirin every day.  Taking vitamin and mineral supplements as recommended by your health care provider. What happens during an annual well check? The services and screenings done by your health care provider during your annual well check will depend on your age, overall health, lifestyle risk factors, and family history of disease. Counseling  Your health care provider may ask you questions about your:  Alcohol use.  Tobacco use.  Drug use.  Emotional well-being.  Home and relationship well-being.  Sexual activity.  Eating habits.  History of falls.  Memory and ability to understand (cognition).  Work and work Statistician.  Reproductive health. Screening  You may have the following tests or measurements:  Height, weight, and BMI.  Blood pressure.  Lipid and cholesterol levels. These may be checked every 5 years, or more frequently if you are over 87 years old.  Skin check.  Lung cancer screening. You may have this screening every year starting at age 80 if you have a 30-pack-year history of smoking and currently smoke or have quit within the past 15 years.  Fecal occult blood test (FOBT) of the stool. You may have this test every year starting at age 59.  Flexible sigmoidoscopy or colonoscopy. You may have a sigmoidoscopy every 5 years or a colonoscopy every 10 years starting at age 59.  Hepatitis C blood test.  Hepatitis B blood test.  Sexually transmitted disease (STD) testing.  Diabetes screening. This is done by checking your blood sugar (glucose) after you have not eaten for a while (fasting). You may have this  done every 1-3 years.  Bone density scan. This is done to screen for osteoporosis. You may have this done starting at age 79.  Mammogram. This may be done every 1-2 years. Talk to your health care provider about how often you should have  regular mammograms. Talk with your health care provider about your test results, treatment options, and if necessary, the need for more tests. Vaccines  Your health care provider may recommend certain vaccines, such as:  Influenza vaccine. This is recommended every year.  Tetanus, diphtheria, and acellular pertussis (Tdap, Td) vaccine. You may need a Td booster every 10 years.  Zoster vaccine. You may need this after age 4.  Pneumococcal 13-valent conjugate (PCV13) vaccine. One dose is recommended after age 62.  Pneumococcal polysaccharide (PPSV23) vaccine. One dose is recommended after age 41. Talk to your health care provider about which screenings and vaccines you need and how often you need them. This information is not intended to replace advice given to you by your health care provider. Make sure you discuss any questions you have with your health care provider. Document Released: 06/18/2015 Document Revised: 02/09/2016 Document Reviewed: 03/23/2015 Elsevier Interactive Patient Education  2017 [place] Prevention in the Home Falls can cause injuries. They can happen to people of all ages. There are many things you can do to make your home safe and to help prevent falls. What can I do on the outside of my home?  Regularly fix the edges of walkways and driveways and fix any cracks.  Remove anything that might make you trip as you walk through a door, such as a raised step or threshold.  Trim any bushes or trees on the path to your home.  Use bright outdoor lighting.  Clear any walking paths of anything that might make someone trip, such as rocks or tools.  Regularly check to see if handrails are loose or broken. Make sure that both sides of any steps have handrails.  Any raised decks and porches should have guardrails on the edges.  Have any leaves, snow, or ice cleared regularly.  Use sand or salt on walking paths during winter.  Clean up any spills in your  garage right away. This includes oil or grease spills. What can I do in the bathroom?  Use night lights.  Install grab bars by the toilet and in the tub and shower. Do not use towel bars as grab bars.  Use non-skid mats or decals in the tub or shower.  If you need to sit down in the shower, use a plastic, non-slip stool.  Keep the floor dry. Clean up any water that spills on the floor as soon as it happens.  Remove soap buildup in the tub or shower regularly.  Attach bath mats securely with double-sided non-slip rug tape.  Do not have throw rugs and other things on the floor that can make you trip. What can I do in the bedroom?  Use night lights.  Make sure that you have a light by your bed that is easy to reach.  Do not use any sheets or blankets that are too big for your bed. They should not hang down onto the floor.  Have a firm chair that has side arms. You can use this for support while you get dressed.  Do not have throw rugs and other things on the floor that can make you trip. What can I do in the kitchen?  Clean up any spills  right away.  Avoid walking on wet floors.  Keep items that you use a lot in easy-to-reach places.  If you need to reach something above you, use a strong step stool that has a grab bar.  Keep electrical cords out of the way.  Do not use floor polish or wax that makes floors slippery. If you must use wax, use non-skid floor wax.  Do not have throw rugs and other things on the floor that can make you trip. What can I do with my stairs?  Do not leave any items on the stairs.  Make sure that there are handrails on both sides of the stairs and use them. Fix handrails that are broken or loose. Make sure that handrails are as long as the stairways.  Check any carpeting to make sure that it is firmly attached to the stairs. Fix any carpet that is loose or worn.  Avoid having throw rugs at the top or bottom of the stairs. If you do have throw  rugs, attach them to the floor with carpet tape.  Make sure that you have a light switch at the top of the stairs and the bottom of the stairs. If you do not have them, ask someone to add them for you. What else can I do to help prevent falls?  Wear shoes that:  Do not have high heels.  Have rubber bottoms.  Are comfortable and fit you well.  Are closed at the toe. Do not wear sandals.  If you use a stepladder:  Make sure that it is fully opened. Do not climb a closed stepladder.  Make sure that both sides of the stepladder are locked into place.  Ask someone to hold it for you, if possible.  Clearly mark and make sure that you can see:  Any grab bars or handrails.  First and last steps.  Where the edge of each step is.  Use tools that help you move around (mobility aids) if they are needed. These include:  Canes.  Walkers.  Scooters.  Crutches.  Turn on the lights when you go into a dark area. Replace any light bulbs as soon as they burn out.  Set up your furniture so you have a clear path. Avoid moving your furniture around.  If any of your floors are uneven, fix them.  If there are any pets around you, be aware of where they are.  Review your medicines with your doctor. Some medicines can make you feel dizzy. This can increase your chance of falling. Ask your doctor what other things that you can do to help prevent falls. This information is not intended to replace advice given to you by your health care provider. Make sure you discuss any questions you have with your health care provider. Document Released: 03/18/2009 Document Revised: 10/28/2015 Document Reviewed: 06/26/2014 Elsevier Interactive Patient Education  2017 [employer].

## 2020-11-03 DIAGNOSIS — C44619 Basal cell carcinoma of skin of left upper limb, including shoulder: Secondary | ICD-10-CM | POA: Diagnosis not present

## 2020-11-03 DIAGNOSIS — L570 Actinic keratosis: Secondary | ICD-10-CM | POA: Diagnosis not present

## 2020-11-03 DIAGNOSIS — L578 Other skin changes due to chronic exposure to nonionizing radiation: Secondary | ICD-10-CM | POA: Diagnosis not present

## 2020-11-03 DIAGNOSIS — C44612 Basal cell carcinoma of skin of right upper limb, including shoulder: Secondary | ICD-10-CM | POA: Diagnosis not present

## 2020-11-03 DIAGNOSIS — D0472 Carcinoma in situ of skin of left lower limb, including hip: Secondary | ICD-10-CM | POA: Diagnosis not present

## 2020-11-03 DIAGNOSIS — D0471 Carcinoma in situ of skin of right lower limb, including hip: Secondary | ICD-10-CM | POA: Diagnosis not present

## 2020-11-03 DIAGNOSIS — D485 Neoplasm of uncertain behavior of skin: Secondary | ICD-10-CM | POA: Diagnosis not present

## 2020-11-29 ENCOUNTER — Other Ambulatory Visit: Payer: Self-pay | Admitting: Family Medicine

## 2020-11-29 DIAGNOSIS — B0229 Other postherpetic nervous system involvement: Secondary | ICD-10-CM

## 2020-11-29 DIAGNOSIS — B028 Zoster with other complications: Secondary | ICD-10-CM

## 2020-12-14 ENCOUNTER — Ambulatory Visit (INDEPENDENT_AMBULATORY_CARE_PROVIDER_SITE_OTHER): Payer: Medicare HMO | Admitting: Family Medicine

## 2020-12-14 ENCOUNTER — Encounter: Payer: Self-pay | Admitting: Family Medicine

## 2020-12-14 ENCOUNTER — Other Ambulatory Visit: Payer: Self-pay

## 2020-12-14 VITALS — BP 144/88 | HR 91 | Temp 98.7°F | Ht 66.0 in | Wt 181.4 lb

## 2020-12-14 DIAGNOSIS — M5136 Other intervertebral disc degeneration, lumbar region: Secondary | ICD-10-CM | POA: Diagnosis not present

## 2020-12-14 DIAGNOSIS — K219 Gastro-esophageal reflux disease without esophagitis: Secondary | ICD-10-CM

## 2020-12-14 DIAGNOSIS — M4727 Other spondylosis with radiculopathy, lumbosacral region: Secondary | ICD-10-CM

## 2020-12-14 DIAGNOSIS — C4492 Squamous cell carcinoma of skin, unspecified: Secondary | ICD-10-CM

## 2020-12-14 DIAGNOSIS — I25118 Atherosclerotic heart disease of native coronary artery with other forms of angina pectoris: Secondary | ICD-10-CM

## 2020-12-14 DIAGNOSIS — D049 Carcinoma in situ of skin, unspecified: Secondary | ICD-10-CM

## 2020-12-14 DIAGNOSIS — Z8781 Personal history of (healed) traumatic fracture: Secondary | ICD-10-CM | POA: Diagnosis not present

## 2020-12-14 MED ORDER — OMEPRAZOLE 20 MG PO CPDR
20.0000 mg | DELAYED_RELEASE_CAPSULE | Freq: Every day | ORAL | 1 refills | Status: DC
Start: 2020-12-14 — End: 2021-06-13

## 2020-12-14 MED ORDER — OXYCODONE-ACETAMINOPHEN 10-325 MG PO TABS: 1.0000 | ORAL_TABLET | Freq: Three times a day (TID) | ORAL | 0 refills | Status: DC | PRN

## 2020-12-14 NOTE — Patient Instructions (Signed)

## 2020-12-14 NOTE — Progress Notes (Signed)
Subjective: CC: Chronic pain follow up PCP: Janora Norlander, DO [name] is a 65 y.o. female presenting to clinic today for:  1. Chronic pain follow up Patient reports that her pain has been chronic and slightly worsened since having shingles but overall tolerable.  She continues to use her Percocet 10 mg 3 times daily.  Denies any excessive daytime sedation, falls, respiratory depression or constipation.  Pain is predominantly in her right foot and back.  She of course has various arthralgias and myalgias as well.  2. GERD Reports some worsening of acid reflux over the last 3 to 4 weeks.  She has been using Tums and Mylanta which do seem to help but symptoms continue to recur.  No GI bleeding, vomiting.  3. Postherpetic neuralgia Continues to suffer from some hyperalgesia.  While she does have some neuropathic pain at the site of the shingles she finds in general her skin seems to be hypersensitive.  Unfortunately she was not able to tolerate gabapentin and was too worried about potential similar side effects of Lyrica so only topical Xylocaine was given at last visit.  She is continued on Elavil by her pain specialist as well.   ROS: Per HPI  Allergies  Allergen Reactions   Gabapentin     Dizziness and blurred vision   Past Medical History:  Diagnosis Date   Anxiety    Arthritis    GERD (gastroesophageal reflux disease)    Hyperlipidemia    Hypertension     Current Outpatient Medications:    alendronate (FOSAMAX) 70 MG tablet, Take 1 tablet (70 mg total) by mouth every 7 (seven) days. Take with a full glass of water on an empty stomach., Disp: 4 tablet, Rfl: 11   amitriptyline (ELAVIL) 100 MG tablet, Take 0.5-1 tablets (50-100 mg total) by mouth at bedtime. Place on hold, Disp: 90 tablet, Rfl: 3   aspirin 81 MG chewable tablet, Chew 81 mg by mouth daily., Disp: , Rfl:    atenolol (TENORMIN) 25 MG tablet, Take 25 mg by mouth daily. , Disp: , Rfl: 2    cholecalciferol (VITAMIN D3) 25 MCG (1000 UT) tablet, Take 1,000 Units by mouth daily., Disp: , Rfl:    diclofenac Sodium (VOLTAREN) 1 % GEL, Apply 4 g topically 4 (four) times daily as needed. (Patient not taking: Reported on 09/13/2020), Disp: 20 g, Rfl: 0   furosemide (LASIX) 20 MG tablet, TAKE 1/2 TO 1 TABLET BY MOUTH ONCE DAILY AS NEEDED FOR FLUID OF EDEMA, Disp: 30 tablet, Rfl: 2   isosorbide mononitrate (IMDUR) 60 MG 24 hr tablet, Take 60 mg by mouth daily., Disp: , Rfl:    lidocaine (XYLOCAINE) 5 % ointment, APPLY 1 APPLICATION TOPICALLY 3 (THREE) TIMES DAILY AS NEEDED FOR MODERATE PAIN., Disp: 35.44 g, Rfl: 1   NARCAN 4 MG/0.1ML LIQD nasal spray kit, , Disp: , Rfl:    nitroGLYCERIN (NITROSTAT) 0.4 MG SL tablet, See admin instructions., Disp: , Rfl:    oxyCODONE-acetaminophen (PERCOCET) 10-325 MG tablet, Take 1 tablet by mouth every 8 (eight) hours as needed for pain., Disp: 90 tablet, Rfl: 0   oxyCODONE-acetaminophen (PERCOCET) 10-325 MG tablet, Take 1 tablet by mouth every 8 (eight) hours as needed for pain., Disp: 90 tablet, Rfl: 0   oxyCODONE-acetaminophen (PERCOCET) 10-325 MG tablet, Take 1 tablet by mouth every 8 (eight) hours as needed for pain., Disp: 90 tablet, Rfl: 0   rosuvastatin (CRESTOR) 5 MG tablet, Take 5 mg by mouth daily., Disp: ,  Rfl:    triamcinolone cream (KENALOG) 0.1 %, Apply topically 2 (two) times daily., Disp: , Rfl:    vitamin B-12 (CYANOCOBALAMIN) 100 MCG tablet, Take 100 mcg by mouth daily. (Patient not taking: Reported on 09/13/2020), Disp: , Rfl:    vitamin C (ASCORBIC ACID) 500 MG tablet, Take 500 mg by mouth daily., Disp: , Rfl:    zolpidem (AMBIEN) 5 MG tablet, Take 1 tablet (5 mg total) by mouth at bedtime as needed for sleep., Disp: 30 tablet, Rfl: 5 Social History   Socioeconomic History   Marital status: Married    Spouse name: Not on file   Number of children: 2   Years of education: Not on file   Highest education level: Not on file  Occupational  History   Occupation: disability  Tobacco Use   Smoking status: Never   Smokeless tobacco: Never  Vaping Use   Vaping Use: Never used  Substance and Sexual Activity   Alcohol use: No   Drug use: No   Sexual activity: Not on file  Other Topics Concern   Not on file  Social History Narrative   Lives with her husband. Children and grandchildren live within 13 miles   Social Determinants of Health   Financial Resource Strain: Low Risk    Difficulty of Paying Living Expenses: Not hard at all  Food Insecurity: No Food Insecurity   Worried About Charity fundraiser in the Last Year: Never true   Arboriculturist in the Last Year: Never true  Transportation Needs: No Transportation Needs   Lack of Transportation (Medical): No   Lack of Transportation (Non-Medical): No  Physical Activity: Insufficiently Active   Days of Exercise per Week: 7 days   Minutes of Exercise per Session: 10 min  Stress: No Stress Concern Present   Feeling of Stress : Only a little  Social Connections: Moderately Isolated   Frequency of Communication with Friends and Family: More than three times a week   Frequency of Social Gatherings with Friends and Family: More than three times a week   Attends Religious Services: Never   Marine scientist or Organizations: No   Attends Music therapist: Never   Marital Status: Married  Human resources officer Violence: Not At Risk   Fear of Current or Ex-Partner: No   Emotionally Abused: No   Physically Abused: No   Sexually Abused: No   Family History  Problem Relation Age of Onset   Congestive Heart Failure Mother    Arthritis Mother    Cancer Father        lung   Arthritis/Rheumatoid Maternal Grandmother    Cancer Paternal Grandfather        skin    Objective: Office vital signs reviewed. BP (!) 144/88   Pulse 91   Temp 98.7 F (37.1 C)   Ht 5' 6"  (1.676 m)   Wt 181 lb 6.4 oz (82.3 kg)   SpO2 98%   BMI 29.28 kg/m   Physical  Examination:  General: Awake, alert, nontoxic, No acute distress HEENT: Normal; Sclera white Cardio: regular rate and rhythm, S1S2 heard, no murmurs appreciated Pulm: clear to auscultation bilaterally, no wheezes, rhonchi or rales; normal work of breathing on room air Extremities: warm, well perfused, No edema, cyanosis or clubbing; +2 pulses bilaterally Skin: She has some postinflammatory hyperpigmentation noted along the right posterior ribs where the shingles had been.  She also has some lesions noted along the left shoulder  that have been recently biopsied and are healing.  There are is some continued flaking of a few of the lesions on the left posterior shoulder.  She has many areas of postsurgical/postinflammatory hypopigmentation along her arms and torso.  Assessment/ Plan: 65 y.o. female   DDD (degenerative disc disease), lumbar - Plan: oxyCODONE-acetaminophen (PERCOCET) 10-325 MG tablet, oxyCODONE-acetaminophen (PERCOCET) 10-325 MG tablet, oxyCODONE-acetaminophen (PERCOCET) 10-325 MG tablet  Osteoarthritis of spine with radiculopathy, lumbosacral region - Plan: oxyCODONE-acetaminophen (PERCOCET) 10-325 MG tablet, oxyCODONE-acetaminophen (PERCOCET) 10-325 MG tablet, oxyCODONE-acetaminophen (PERCOCET) 10-325 MG tablet  History of compression fracture of spine - Plan: oxyCODONE-acetaminophen (PERCOCET) 10-325 MG tablet, oxyCODONE-acetaminophen (PERCOCET) 10-325 MG tablet, oxyCODONE-acetaminophen (PERCOCET) 10-325 MG tablet  Gastroesophageal reflux disease without esophagitis - Plan: omeprazole (PRILOSEC) 20 MG capsule  Squamous cell skin cancer  Basal cell carcinoma (BCC) in situ of skin - Plan: CBC  Coronary artery disease of native artery of native heart with stable angina pectoris ([place]) - Plan: CMP14+EGFR, LDL Cholesterol, Direct, TSH  Pain is somewhat exacerbated by recent events but unfortunately we are at her max dosing for this clinic as far as morphine milliequivalents ago.  I  do not think that she is at a point where she is wanting to proceed with an increased dose of the opioid anyways at this time.  She is up-to-date on CSC and UDS.  The national narcotic database was reviewed and postdated scripts have been sent  She has some uncontrolled GERD.  I want her to go on Prilosec for about 3 or 4 weeks then discontinue.  She may resume as needed use of this medicine if needed for future flareups but we discussed the risk of continued bone deterioration with chronic use of PPI.  Given the diagnoses of various squamous cell and basal cell carcinomas we will check a CBC along with other labs  Has history of CAD with stable angina.  Check direct LDL, CMP and TSH in anticipation of her upcoming visit with Dr. Terrence Dupont.  No orders of the defined types were placed in this encounter.  No orders of the defined types were placed in this encounter.    Janora Norlander, DO [facility] [phone]

## 2020-12-15 ENCOUNTER — Other Ambulatory Visit: Payer: Self-pay | Admitting: Family Medicine

## 2020-12-15 LAB — CMP14+EGFR
ALT: 17 IU/L (ref 0–32)
AST: 21 IU/L (ref 0–40)
Albumin/Globulin Ratio: 1.7 (ref 1.2–2.2)
Albumin: 4.2 g/dL (ref 3.8–4.8)
Alkaline Phosphatase: 101 IU/L (ref 44–121)
BUN/Creatinine Ratio: 14 (ref 12–28)
BUN: 13 mg/dL (ref 8–27)
Bilirubin Total: 0.3 mg/dL (ref 0.0–1.2)
CO2: 24 mmol/L (ref 20–29)
Calcium: 9.8 mg/dL (ref 8.7–10.3)
Chloride: 98 mmol/L (ref 96–106)
Creatinine, Ser: 0.9 mg/dL (ref 0.57–1.00)
Globulin, Total: 2.5 g/dL (ref 1.5–4.5)
Glucose: 114 mg/dL — ABNORMAL HIGH (ref 65–99)
Potassium: 4.7 mmol/L (ref 3.5–5.2)
Sodium: 140 mmol/L (ref 134–144)
Total Protein: 6.7 g/dL (ref 6.0–8.5)
eGFR: 71 mL/min/{1.73_m2} (ref >59)

## 2020-12-15 LAB — TSH: TSH: 3.58 u[IU]/mL (ref 0.450–4.500)

## 2020-12-15 LAB — CBC
Hematocrit: 39.5 % (ref 34.0–46.6)
Hemoglobin: 13.3 g/dL (ref 11.1–15.9)
MCH: 32.3 pg (ref 26.6–33.0)
MCHC: 33.7 g/dL (ref 31.5–35.7)
MCV: 96 fL (ref 79–97)
Platelets: 317 10*3/uL (ref 150–450)
RBC: 4.12 x10E6/uL (ref 3.77–5.28)
RDW: 12.6 % (ref 11.7–15.4)
WBC: 10.6 10*3/uL (ref 3.4–10.8)

## 2020-12-15 LAB — LDL CHOLESTEROL, DIRECT: LDL Direct: 126 mg/dL — ABNORMAL HIGH (ref 0–99)

## 2021-01-07 DIAGNOSIS — I10 Essential (primary) hypertension: Secondary | ICD-10-CM | POA: Diagnosis not present

## 2021-01-07 DIAGNOSIS — I25111 Atherosclerotic heart disease of native coronary artery with angina pectoris with documented spasm: Secondary | ICD-10-CM | POA: Diagnosis not present

## 2021-01-07 DIAGNOSIS — R7303 Prediabetes: Secondary | ICD-10-CM | POA: Diagnosis not present

## 2021-01-07 DIAGNOSIS — E785 Hyperlipidemia, unspecified: Secondary | ICD-10-CM | POA: Diagnosis not present

## 2021-01-14 DIAGNOSIS — L988 Other specified disorders of the skin and subcutaneous tissue: Secondary | ICD-10-CM | POA: Diagnosis not present

## 2021-01-14 DIAGNOSIS — C44612 Basal cell carcinoma of skin of right upper limb, including shoulder: Secondary | ICD-10-CM | POA: Diagnosis not present

## 2021-01-25 DIAGNOSIS — C44619 Basal cell carcinoma of skin of left upper limb, including shoulder: Secondary | ICD-10-CM | POA: Diagnosis not present

## 2021-02-11 DIAGNOSIS — L578 Other skin changes due to chronic exposure to nonionizing radiation: Secondary | ICD-10-CM | POA: Diagnosis not present

## 2021-02-11 DIAGNOSIS — C44722 Squamous cell carcinoma of skin of right lower limb, including hip: Secondary | ICD-10-CM | POA: Diagnosis not present

## 2021-02-11 DIAGNOSIS — D0472 Carcinoma in situ of skin of left lower limb, including hip: Secondary | ICD-10-CM | POA: Diagnosis not present

## 2021-02-11 DIAGNOSIS — L814 Other melanin hyperpigmentation: Secondary | ICD-10-CM | POA: Diagnosis not present

## 2021-02-11 DIAGNOSIS — C44729 Squamous cell carcinoma of skin of left lower limb, including hip: Secondary | ICD-10-CM | POA: Diagnosis not present

## 2021-02-11 DIAGNOSIS — L820 Inflamed seborrheic keratosis: Secondary | ICD-10-CM | POA: Diagnosis not present

## 2021-02-11 DIAGNOSIS — Z86007 Personal history of in-situ neoplasm of skin: Secondary | ICD-10-CM | POA: Diagnosis not present

## 2021-02-11 DIAGNOSIS — L570 Actinic keratosis: Secondary | ICD-10-CM | POA: Diagnosis not present

## 2021-02-11 DIAGNOSIS — D0471 Carcinoma in situ of skin of right lower limb, including hip: Secondary | ICD-10-CM | POA: Diagnosis not present

## 2021-02-11 DIAGNOSIS — D485 Neoplasm of uncertain behavior of skin: Secondary | ICD-10-CM | POA: Diagnosis not present

## 2021-02-11 DIAGNOSIS — Z85828 Personal history of other malignant neoplasm of skin: Secondary | ICD-10-CM | POA: Diagnosis not present

## 2021-03-12 ENCOUNTER — Other Ambulatory Visit: Payer: Self-pay | Admitting: Family Medicine

## 2021-03-16 ENCOUNTER — Other Ambulatory Visit: Payer: Self-pay

## 2021-03-16 ENCOUNTER — Ambulatory Visit (INDEPENDENT_AMBULATORY_CARE_PROVIDER_SITE_OTHER): Payer: Medicare HMO | Admitting: Family Medicine

## 2021-03-16 ENCOUNTER — Encounter: Payer: Self-pay | Admitting: Family Medicine

## 2021-03-16 VITALS — BP 130/82 | HR 102 | Temp 98.2°F | Resp 20 | Ht 66.0 in | Wt 188.0 lb

## 2021-03-16 DIAGNOSIS — D049 Carcinoma in situ of skin, unspecified: Secondary | ICD-10-CM | POA: Diagnosis not present

## 2021-03-16 DIAGNOSIS — R69 Illness, unspecified: Secondary | ICD-10-CM | POA: Diagnosis not present

## 2021-03-16 DIAGNOSIS — F1190 Opioid use, unspecified, uncomplicated: Secondary | ICD-10-CM | POA: Diagnosis not present

## 2021-03-16 DIAGNOSIS — Z23 Encounter for immunization: Secondary | ICD-10-CM

## 2021-03-16 DIAGNOSIS — M5136 Other intervertebral disc degeneration, lumbar region: Secondary | ICD-10-CM

## 2021-03-16 DIAGNOSIS — M810 Age-related osteoporosis without current pathological fracture: Secondary | ICD-10-CM

## 2021-03-16 DIAGNOSIS — R739 Hyperglycemia, unspecified: Secondary | ICD-10-CM

## 2021-03-16 DIAGNOSIS — Z0001 Encounter for general adult medical examination with abnormal findings: Secondary | ICD-10-CM

## 2021-03-16 DIAGNOSIS — M4727 Other spondylosis with radiculopathy, lumbosacral region: Secondary | ICD-10-CM

## 2021-03-16 DIAGNOSIS — Z0000 Encounter for general adult medical examination without abnormal findings: Secondary | ICD-10-CM

## 2021-03-16 DIAGNOSIS — F5101 Primary insomnia: Secondary | ICD-10-CM

## 2021-03-16 LAB — BAYER DCA HB A1C WAIVED: HB A1C (BAYER DCA - WAIVED): 6 % — ABNORMAL HIGH (ref 4.8–5.6)

## 2021-03-16 MED ORDER — ZOLPIDEM TARTRATE 5 MG PO TABS: 5.0000 mg | ORAL_TABLET | Freq: Every evening | ORAL | 5 refills | Status: DC | PRN

## 2021-03-16 MED ORDER — ALENDRONATE SODIUM 70 MG PO TABS: 70.0000 mg | ORAL_TABLET | ORAL | 12 refills | Status: DC

## 2021-03-16 MED ORDER — OXYCODONE-ACETAMINOPHEN 10-325 MG PO TABS: 1.0000 | ORAL_TABLET | Freq: Three times a day (TID) | ORAL | 0 refills | Status: DC | PRN

## 2021-03-16 NOTE — Progress Notes (Signed)
[name] is a 65 y.o. female presents to office today for annual physical exam examination.    Concerns today include: 1.  DDD/ chronic pain Reports that she is hurting all over, particularly in her hips and legs.  She was seen by Dr. Wynelle Link quite a while ago and was told that he would be glad to give her intra-articular injections into that right hip if she so desired.  She has not yet called him but is planning to do so.  She also gets intermittent back injections.  She takes the Percocet 3 times daily.  Denies any excessive daytime sedation, falls, respiratory depression.  2.  Osteoporosis Patient needs refills on Fosamax.  She is been out of this for several weeks now.  3.  Skin cancers Patient continues to see her specialist for skin cancer removal.  She has an upcoming appointment with the skin surgery center to remove more of the lesion that was noted on the back  4.  Insomnia Patient uses Ambien fairly regularly.  Denies any excessive daytime sedation.  No visual auditory hallucinations.  No falls   Immunizations needed: Immunization History  Administered Date(s) Administered   Fluad Quad(high Dose 65+) 02/28/2020   Influenza, Seasonal, Injecte, Preservative Fre 02/18/2014   Influenza,inj,Quad PF,6+ Mos 02/22/2017, 03/22/2018, 02/18/2019   Moderna Sars-Covid-2 Vaccination 06/21/2019, 07/22/2019, 04/07/2020   Pneumococcal Conjugate-13 03/15/2020   Tdap 02/10/2011     Past Medical History:  Diagnosis Date   Anxiety    Arthritis    GERD (gastroesophageal reflux disease)    Hyperlipidemia    Hypertension    Social History   Socioeconomic History   Marital status: Married    Spouse name: Not on file   Number of children: 2   Years of education: Not on file   Highest education level: Not on file  Occupational History   Occupation: disability  Tobacco Use   Smoking status: Never   Smokeless tobacco: Never  Vaping Use   Vaping Use: Never used  Substance  and Sexual Activity   Alcohol use: No   Drug use: No   Sexual activity: Not on file  Other Topics Concern   Not on file  Social History Narrative   Lives with her husband. Children and grandchildren live within 55 miles   Social Determinants of Health   Financial Resource Strain: Low Risk    Difficulty of Paying Living Expenses: Not hard at all  Food Insecurity: No Food Insecurity   Worried About Charity fundraiser in the Last Year: Never true   Arboriculturist in the Last Year: Never true  Transportation Needs: No Transportation Needs   Lack of Transportation (Medical): No   Lack of Transportation (Non-Medical): No  Physical Activity: Insufficiently Active   Days of Exercise per Week: 7 days   Minutes of Exercise per Session: 10 min  Stress: No Stress Concern Present   Feeling of Stress : Only a little  Social Connections: Moderately Isolated   Frequency of Communication with Friends and Family: More than three times a week   Frequency of Social Gatherings with Friends and Family: More than three times a week   Attends Religious Services: Never   Marine scientist or Organizations: No   Attends Archivist Meetings: Never   Marital Status: Married  Human resources officer Violence: Not At Risk   Fear of Current or Ex-Partner: No   Emotionally Abused: No   Physically Abused: No  Sexually Abused: No   Past Surgical History:  Procedure Laterality Date   ABDOMINAL HYSTERECTOMY     CERVICAL FUSION     KNEE ARTHROSCOPY Left    LUMBAR FUSION     skin cancer removal  09/25/2019   right thigh   Family History  Problem Relation Age of Onset   Congestive Heart Failure Mother    Arthritis Mother    Cancer Father        lung   Arthritis/Rheumatoid Maternal Grandmother    Cancer Paternal Grandfather        skin    Current Outpatient Medications:    alendronate (FOSAMAX) 70 MG tablet, Take 1 tablet (70 mg total) by mouth every 7 (seven) days. Take with a full  glass of water on an empty stomach., Disp: 4 tablet, Rfl: 11   amitriptyline (ELAVIL) 100 MG tablet, Take 0.5-1 tablets (50-100 mg total) by mouth at bedtime. Place on hold, Disp: 90 tablet, Rfl: 3   aspirin 81 MG chewable tablet, Chew 81 mg by mouth daily., Disp: , Rfl:    atenolol (TENORMIN) 25 MG tablet, Take 25 mg by mouth daily. , Disp: , Rfl: 2   cholecalciferol (VITAMIN D3) 25 MCG (1000 UT) tablet, Take 1,000 Units by mouth daily., Disp: , Rfl:    diclofenac Sodium (VOLTAREN) 1 % GEL, Apply 4 g topically 4 (four) times daily as needed. (Patient not taking: No sig reported), Disp: 20 g, Rfl: 0   furosemide (LASIX) 20 MG tablet, TAKE 1/2 TO 1 TABLET BY MOUTH ONCE DAILY AS NEEDED FOR FLUID OF EDEMA, Disp: 90 tablet, Rfl: 0   isosorbide mononitrate (IMDUR) 60 MG 24 hr tablet, Take 60 mg by mouth daily., Disp: , Rfl:    lidocaine (XYLOCAINE) 5 % ointment, APPLY 1 APPLICATION TOPICALLY 3 (THREE) TIMES DAILY AS NEEDED FOR MODERATE PAIN., Disp: 35.44 g, Rfl: 1   NARCAN 4 MG/0.1ML LIQD nasal spray kit, , Disp: , Rfl:    nitroGLYCERIN (NITROSTAT) 0.4 MG SL tablet, See admin instructions., Disp: , Rfl:    omeprazole (PRILOSEC) 20 MG capsule, Take 1 capsule (20 mg total) by mouth daily. X2-4weeks per flare, Disp: 90 capsule, Rfl: 1   oxyCODONE-acetaminophen (PERCOCET) 10-325 MG tablet, Take 1 tablet by mouth every 8 (eight) hours as needed for pain., Disp: 90 tablet, Rfl: 0   oxyCODONE-acetaminophen (PERCOCET) 10-325 MG tablet, Take 1 tablet by mouth every 8 (eight) hours as needed for pain., Disp: 90 tablet, Rfl: 0   oxyCODONE-acetaminophen (PERCOCET) 10-325 MG tablet, Take 1 tablet by mouth every 8 (eight) hours as needed for pain., Disp: 90 tablet, Rfl: 0   rosuvastatin (CRESTOR) 5 MG tablet, Take 5 mg by mouth daily., Disp: , Rfl:    triamcinolone cream (KENALOG) 0.1 %, Apply topically 2 (two) times daily., Disp: , Rfl:    vitamin B-12 (CYANOCOBALAMIN) 100 MCG tablet, Take 100 mcg by mouth daily.  (Patient not taking: No sig reported), Disp: , Rfl:    vitamin C (ASCORBIC ACID) 500 MG tablet, Take 500 mg by mouth daily., Disp: , Rfl:    zolpidem (AMBIEN) 5 MG tablet, Take 1 tablet (5 mg total) by mouth at bedtime as needed for sleep., Disp: 30 tablet, Rfl: 5  Allergies  Allergen Reactions   Gabapentin     Dizziness and blurred vision     ROS: Review of Systems Pertinent items noted in HPI and remainder of comprehensive ROS otherwise negative.    Physical exam BP 130/82  Pulse (!) 102   Temp 98.2 F (36.8 C) (Temporal)   Resp 20   Ht 5' 6" (1.676 m)   Wt 188 lb (85.3 kg)   SpO2 100%   BMI 30.34 kg/m  General appearance: alert, cooperative, appears stated age, and no distress Head: Normocephalic, without obvious abnormality, atraumatic Eyes: conjunctivae/corneas clear. PERRL, EOM's intact. Fundi benign. Ears: normal TM's and external ear canals both ears Nose: Nares normal. Septum midline. Mucosa normal. No drainage or sinus tenderness. Throat: lips, mucosa, and tongue normal; teeth and gums normal Neck: no adenopathy, no carotid bruit, supple, symmetrical, trachea midline, and thyroid not enlarged, symmetric, no tenderness/mass/nodules Back: symmetric, no curvature. ROM normal. No CVA tenderness. Lungs: clear to auscultation bilaterally Heart: regular rate and rhythm, S1, S2 normal, no murmur, click, rub or gallop Abdomen: soft, non-tender; bowel sounds normal; no masses,  no organomegaly Extremities: extremities normal, atraumatic, no cyanosis or edema Pulses: 2+ and symmetric Skin:  Multiple skin lesions noted throughout Lymph nodes: Cervical, supraclavicular, and axillary nodes normal. Neurologic: Grossly normal MSK: Gait is antalgic.  Requires cane for ambulation.  She has arthritic changes throughout her knees and hands bilaterally.   Assessment/ Plan: [name] here for annual physical exam.   Annual physical exam  DDD (degenerative disc disease),  lumbar - Plan: oxyCODONE-acetaminophen (PERCOCET) 10-325 MG tablet, oxyCODONE-acetaminophen (PERCOCET) 10-325 MG tablet, oxyCODONE-acetaminophen (PERCOCET) 10-325 MG tablet  Osteoarthritis of spine with radiculopathy, lumbosacral region - Plan: oxyCODONE-acetaminophen (PERCOCET) 10-325 MG tablet, oxyCODONE-acetaminophen (PERCOCET) 10-325 MG tablet, oxyCODONE-acetaminophen (PERCOCET) 10-325 MG tablet  Chronic, continuous use of opioids  Primary insomnia - Plan: zolpidem (AMBIEN) 5 MG tablet  Elevated serum glucose - Plan: Bayer DCA Hb A1c Waived  Basal cell carcinoma (BCC) in situ of skin  Need for immunization against influenza - Plan: Flu Vaccine QUAD High Dose(Fluad)  Age-related osteoporosis without current pathological fracture - Plan: alendronate (FOSAMAX) 70 MG tablet  Up-to-date on preventative health care Influenza vaccination administered  Pain is somewhat exacerbated.  Encouraged her to see her specialist for injection therapy.  She is on max dose of Percocet here and we would have to refer her to pain management center going forward if she needed additional interventions.  The national narcotic database was reviewed and there were no red flags.  She is up-to-date on CSA and UDS  Insomnia stable.  No red flags.  Refills have been sent  Check A1c given elevation in serum glucose noted on last labs  Continue to follow-up with dermatology for ongoing surveillance and treatment of her skin cancers  Fosamax renewed.  She is not due for repeat DEXA scan until October 2023.  Counseled on healthy lifestyle choices, including diet (rich in fruits, vegetables and lean meats and low in salt and simple carbohydrates) and exercise (at least 30 minutes of moderate physical activity daily).  Patient to follow up in 1 year for annual exam or sooner if needed.  Ivy Puryear M. Lajuana Ripple, DO

## 2021-04-04 ENCOUNTER — Other Ambulatory Visit: Payer: Self-pay | Admitting: Family Medicine

## 2021-04-04 DIAGNOSIS — F5101 Primary insomnia: Secondary | ICD-10-CM

## 2021-04-06 DIAGNOSIS — R7303 Prediabetes: Secondary | ICD-10-CM | POA: Diagnosis not present

## 2021-04-06 DIAGNOSIS — I10 Essential (primary) hypertension: Secondary | ICD-10-CM | POA: Diagnosis not present

## 2021-04-06 DIAGNOSIS — E785 Hyperlipidemia, unspecified: Secondary | ICD-10-CM | POA: Diagnosis not present

## 2021-04-08 DIAGNOSIS — I10 Essential (primary) hypertension: Secondary | ICD-10-CM | POA: Diagnosis not present

## 2021-04-08 DIAGNOSIS — E785 Hyperlipidemia, unspecified: Secondary | ICD-10-CM | POA: Diagnosis not present

## 2021-04-08 DIAGNOSIS — I25111 Atherosclerotic heart disease of native coronary artery with angina pectoris with documented spasm: Secondary | ICD-10-CM | POA: Diagnosis not present

## 2021-04-14 DIAGNOSIS — C44519 Basal cell carcinoma of skin of other part of trunk: Secondary | ICD-10-CM | POA: Diagnosis not present

## 2021-04-16 DIAGNOSIS — S82035A Nondisplaced transverse fracture of left patella, initial encounter for closed fracture: Secondary | ICD-10-CM | POA: Diagnosis not present

## 2021-04-16 DIAGNOSIS — S80212A Abrasion, left knee, initial encounter: Secondary | ICD-10-CM | POA: Diagnosis not present

## 2021-04-16 DIAGNOSIS — S50311A Abrasion of right elbow, initial encounter: Secondary | ICD-10-CM | POA: Diagnosis not present

## 2021-04-19 DIAGNOSIS — C44722 Squamous cell carcinoma of skin of right lower limb, including hip: Secondary | ICD-10-CM | POA: Diagnosis not present

## 2021-04-19 DIAGNOSIS — D0471 Carcinoma in situ of skin of right lower limb, including hip: Secondary | ICD-10-CM | POA: Diagnosis not present

## 2021-06-09 DIAGNOSIS — D0472 Carcinoma in situ of skin of left lower limb, including hip: Secondary | ICD-10-CM | POA: Diagnosis not present

## 2021-06-11 ENCOUNTER — Other Ambulatory Visit: Payer: Self-pay | Admitting: Family Medicine

## 2021-06-11 DIAGNOSIS — M5136 Other intervertebral disc degeneration, lumbar region: Secondary | ICD-10-CM

## 2021-06-11 DIAGNOSIS — K219 Gastro-esophageal reflux disease without esophagitis: Secondary | ICD-10-CM

## 2021-06-15 ENCOUNTER — Ambulatory Visit (INDEPENDENT_AMBULATORY_CARE_PROVIDER_SITE_OTHER): Payer: Medicare HMO | Admitting: Family Medicine

## 2021-06-15 VITALS — BP 136/83 | HR 91 | Temp 98.4°F | Ht 66.0 in | Wt 184.0 lb

## 2021-06-15 DIAGNOSIS — C44619 Basal cell carcinoma of skin of left upper limb, including shoulder: Secondary | ICD-10-CM

## 2021-06-15 DIAGNOSIS — R69 Illness, unspecified: Secondary | ICD-10-CM | POA: Diagnosis not present

## 2021-06-15 DIAGNOSIS — Z79899 Other long term (current) drug therapy: Secondary | ICD-10-CM

## 2021-06-15 DIAGNOSIS — M5136 Other intervertebral disc degeneration, lumbar region: Secondary | ICD-10-CM | POA: Diagnosis not present

## 2021-06-15 DIAGNOSIS — M4727 Other spondylosis with radiculopathy, lumbosacral region: Secondary | ICD-10-CM

## 2021-06-15 DIAGNOSIS — F1190 Opioid use, unspecified, uncomplicated: Secondary | ICD-10-CM

## 2021-06-15 DIAGNOSIS — C44729 Squamous cell carcinoma of skin of left lower limb, including hip: Secondary | ICD-10-CM | POA: Diagnosis not present

## 2021-06-15 MED ORDER — OXYCODONE-ACETAMINOPHEN 10-325 MG PO TABS: 1.0000 | ORAL_TABLET | Freq: Three times a day (TID) | ORAL | 0 refills | Status: DC | PRN

## 2021-06-15 NOTE — Progress Notes (Signed)
Subjective: [name]:RUEA management PCP: Janora Norlander, DO VWU:[id number] B Coss is a 66 y.o. female presenting to clinic today for:  1. Pain management Patient continues to take Percocet up to 3 times daily as needed pain.  She recently had a very large area of her left upper back resected which apparently had 4 different types of basal cell carcinoma.  She also has had a left lateral leg excision for squamous cell carcinoma.  She is currently waiting for skin graft due to the largeness of the area needing to be removed.  She has follow-up tomorrow with her dermatologist.  She denies any constipation, falls, respiratory depression, dizziness with the Percocet.  Last filled 05/19/2021   ROS: Per HPI  Allergies  Allergen Reactions   Gabapentin     Dizziness and blurred vision   Past Medical History:  Diagnosis Date   Anxiety    Arthritis    GERD (gastroesophageal reflux disease)    Hyperlipidemia    Hypertension     Current Outpatient Medications:    alendronate (FOSAMAX) 70 MG tablet, Take 1 tablet (70 mg total) by mouth every 7 (seven) days. Take with a full glass of water on an empty stomach., Disp: 4 tablet, Rfl: 12   amitriptyline (ELAVIL) 100 MG tablet, Take 0.5-1 tablets (50-100 mg total) by mouth at bedtime., Disp: 90 tablet, Rfl: 0   aspirin 81 MG chewable tablet, Chew 81 mg by mouth daily., Disp: , Rfl:    atenolol (TENORMIN) 25 MG tablet, Take 25 mg by mouth daily. , Disp: , Rfl: 2   cholecalciferol (VITAMIN D3) 25 MCG (1000 UT) tablet, Take 1,000 Units by mouth daily., Disp: , Rfl:    isosorbide mononitrate (IMDUR) 60 MG 24 hr tablet, Take 60 mg by mouth daily., Disp: , Rfl:    NARCAN 4 MG/0.1ML LIQD nasal spray kit, , Disp: , Rfl:    nitroGLYCERIN (NITROSTAT) 0.4 MG SL tablet, See admin instructions., Disp: , Rfl:    omeprazole (PRILOSEC) 20 MG capsule, TAKE 1 CAPSULE (20 MG TOTAL) BY MOUTH DAILY. X2-4WEEKS PER FLARE, Disp: 90 capsule, Rfl: 0   rosuvastatin (CRESTOR) 5  MG tablet, Take 5 mg by mouth daily., Disp: , Rfl:    triamcinolone cream (KENALOG) 0.1 %, Apply topically 2 (two) times daily., Disp: , Rfl:    vitamin C (ASCORBIC ACID) 500 MG tablet, Take 500 mg by mouth daily., Disp: , Rfl:    zolpidem (AMBIEN) 5 MG tablet, Take 1 tablet (5 mg total) by mouth at bedtime as needed for sleep., Disp: 30 tablet, Rfl: 5   furosemide (LASIX) 20 MG tablet, TAKE 1/2 TO 1 TABLET BY MOUTH ONCE DAILY AS NEEDED FOR FLUID OF EDEMA (Patient not taking: Reported on 06/15/2021), Disp: 90 tablet, Rfl: 0   [START ON 08/20/2021] oxyCODONE-acetaminophen (PERCOCET) 10-325 MG tablet, Take 1 tablet by mouth every 8 (eight) hours as needed for pain., Disp: 90 tablet, Rfl: 0   [START ON 07/23/2021] oxyCODONE-acetaminophen (PERCOCET) 10-325 MG tablet, Take 1 tablet by mouth every 8 (eight) hours as needed for pain., Disp: 90 tablet, Rfl: 0   [START ON 06/23/2021] oxyCODONE-acetaminophen (PERCOCET) 10-325 MG tablet, Take 1 tablet by mouth every 8 (eight) hours as needed for pain., Disp: 90 tablet, Rfl: 0 Social History   Socioeconomic History   Marital status: Married    Spouse name: Not on file   Number of children: 2   Years of education: Not on file   Highest education level:  Not on file  Occupational History   Occupation: disability  Tobacco Use   Smoking status: Never   Smokeless tobacco: Never  Vaping Use   Vaping Use: Never used  Substance and Sexual Activity   Alcohol use: No   Drug use: No   Sexual activity: Not on file  Other Topics Concern   Not on file  Social History Narrative   Lives with her husband. Children and grandchildren live within 61 miles   Social Determinants of Health   Financial Resource Strain: Low Risk    Difficulty of Paying Living Expenses: Not hard at all  Food Insecurity: No Food Insecurity   Worried About Charity fundraiser in the Last Year: Never true   Arboriculturist in the Last Year: Never true  Transportation Needs: No  Transportation Needs   Lack of Transportation (Medical): No   Lack of Transportation (Non-Medical): No  Physical Activity: Insufficiently Active   Days of Exercise per Week: 7 days   Minutes of Exercise per Session: 10 min  Stress: No Stress Concern Present   Feeling of Stress : Only a little  Social Connections: Moderately Isolated   Frequency of Communication with Friends and Family: More than three times a week   Frequency of Social Gatherings with Friends and Family: More than three times a week   Attends Religious Services: Never   Marine scientist or Organizations: No   Attends Music therapist: Never   Marital Status: Married  Human resources officer Violence: Not At Risk   Fear of Current or Ex-Partner: No   Emotionally Abused: No   Physically Abused: No   Sexually Abused: No   Family History  Problem Relation Age of Onset   Congestive Heart Failure Mother    Arthritis Mother    Cancer Father        lung   Arthritis/Rheumatoid Maternal Grandmother    Cancer Paternal Grandfather        skin    Objective: Office vital signs reviewed. BP 136/83    Pulse 91    Temp 98.4 F (36.9 C) (Temporal)    Ht 5' 6" (1.676 m)    Wt 184 lb (83.5 kg)    BMI 29.70 kg/m   Physical Examination:  General: Awake, alert, well nourished, No acute distress Cardio: regular rate and rhythm, S1S2 heard, no murmurs appreciated Pulm: clear to auscultation bilaterally, no wheezes, rhonchi or rales; normal work of breathing on room air MSK: Antalgic gait.  Utilizing cane for ambulation Skin: Well-healed postsurgical, curvilinear scar noted along the left posterior upper back.  We did not remove her bandage from the left lower extremity today.  Assessment/ Plan: 66 y.o. female   DDD (degenerative disc disease), lumbar - Plan: ToxASSURE Select 13 (MW), Urine, oxyCODONE-acetaminophen (PERCOCET) 10-325 MG tablet, oxyCODONE-acetaminophen (PERCOCET) 10-325 MG tablet,  oxyCODONE-acetaminophen (PERCOCET) 10-325 MG tablet  Osteoarthritis of spine with radiculopathy, lumbosacral region - Plan: ToxASSURE Select 13 (MW), Urine, oxyCODONE-acetaminophen (PERCOCET) 10-325 MG tablet, oxyCODONE-acetaminophen (PERCOCET) 10-325 MG tablet, oxyCODONE-acetaminophen (PERCOCET) 10-325 MG tablet  Chronic, continuous use of opioids - Plan: ToxASSURE Select 13 (MW), Urine  Controlled substance agreement signed - Plan: ToxASSURE Select 13 (MW), Urine  Basal cell carcinoma (BCC) of skin of left upper extremity including shoulder  Squamous cell carcinoma of skin of left lower extremity  Chronic pain is stable.  No red flag signs or symptoms.  The national narcotic database was reviewed and there were no red  flags.  Her controlled substance contract will be updated next visit as it is due in April but we went ahead and updated her urine drug screen today.  Looks like she is healed very well from the surgical excision of the basal cell on her left shoulder.  She is not exhibiting any signs or symptoms of infection in the left lower extremity but bandage was not removed as I did not want to compromise its integrity  Patient will be having labs performed with her heart doctor next month and she will have these copy sent to me  Orders Placed This Encounter  Procedures   ToxASSURE Select 13 (MW), Urine   Meds ordered this encounter  Medications   oxyCODONE-acetaminophen (PERCOCET) 10-325 MG tablet    Sig: Take 1 tablet by mouth every 8 (eight) hours as needed for pain.    Dispense:  90 tablet    Refill:  0   oxyCODONE-acetaminophen (PERCOCET) 10-325 MG tablet    Sig: Take 1 tablet by mouth every 8 (eight) hours as needed for pain.    Dispense:  90 tablet    Refill:  0   oxyCODONE-acetaminophen (PERCOCET) 10-325 MG tablet    Sig: Take 1 tablet by mouth every 8 (eight) hours as needed for pain.    Dispense:  90 tablet    Refill:  [place], DO [place] [phone]

## 2021-06-18 ENCOUNTER — Encounter: Payer: Self-pay | Admitting: Family Medicine

## 2021-06-20 MED ORDER — OXYCODONE-ACETAMINOPHEN 10-325 MG PO TABS: 1.0000 | ORAL_TABLET | Freq: Three times a day (TID) | ORAL | 0 refills | Status: DC | PRN

## 2021-06-20 NOTE — Addendum Note (Signed)
Addended by: Janora Norlander on: 06/20/2021 09:59 AM   Modules accepted: Orders

## 2021-06-21 LAB — TOXASSURE SELECT 13 (MW), URINE

## 2021-06-23 DIAGNOSIS — Z48817 Encounter for surgical aftercare following surgery on the skin and subcutaneous tissue: Secondary | ICD-10-CM | POA: Diagnosis not present

## 2021-06-23 DIAGNOSIS — D0471 Carcinoma in situ of skin of right lower limb, including hip: Secondary | ICD-10-CM | POA: Diagnosis not present

## 2021-06-30 DIAGNOSIS — D0471 Carcinoma in situ of skin of right lower limb, including hip: Secondary | ICD-10-CM | POA: Diagnosis not present

## 2021-07-06 ENCOUNTER — Other Ambulatory Visit: Payer: Self-pay | Admitting: Family Medicine

## 2021-07-06 DIAGNOSIS — M5136 Other intervertebral disc degeneration, lumbar region: Secondary | ICD-10-CM

## 2021-07-14 DIAGNOSIS — Z48817 Encounter for surgical aftercare following surgery on the skin and subcutaneous tissue: Secondary | ICD-10-CM | POA: Diagnosis not present

## 2021-08-05 DIAGNOSIS — I25118 Atherosclerotic heart disease of native coronary artery with other forms of angina pectoris: Secondary | ICD-10-CM | POA: Diagnosis not present

## 2021-08-05 DIAGNOSIS — I10 Essential (primary) hypertension: Secondary | ICD-10-CM | POA: Diagnosis not present

## 2021-08-05 DIAGNOSIS — E785 Hyperlipidemia, unspecified: Secondary | ICD-10-CM | POA: Diagnosis not present

## 2021-08-05 DIAGNOSIS — R7303 Prediabetes: Secondary | ICD-10-CM | POA: Diagnosis not present

## 2021-09-08 DIAGNOSIS — D0462 Carcinoma in situ of skin of left upper limb, including shoulder: Secondary | ICD-10-CM | POA: Diagnosis not present

## 2021-09-08 DIAGNOSIS — L986 Other infiltrative disorders of the skin and subcutaneous tissue: Secondary | ICD-10-CM | POA: Diagnosis not present

## 2021-09-08 DIAGNOSIS — D485 Neoplasm of uncertain behavior of skin: Secondary | ICD-10-CM | POA: Diagnosis not present

## 2021-09-13 ENCOUNTER — Ambulatory Visit: Payer: Medicare HMO | Admitting: Family Medicine

## 2021-09-13 ENCOUNTER — Other Ambulatory Visit: Payer: Self-pay | Admitting: Family Medicine

## 2021-09-13 DIAGNOSIS — F5101 Primary insomnia: Secondary | ICD-10-CM

## 2021-09-14 ENCOUNTER — Other Ambulatory Visit: Payer: Self-pay | Admitting: Family Medicine

## 2021-09-14 DIAGNOSIS — K219 Gastro-esophageal reflux disease without esophagitis: Secondary | ICD-10-CM

## 2021-10-04 DIAGNOSIS — D0462 Carcinoma in situ of skin of left upper limb, including shoulder: Secondary | ICD-10-CM | POA: Diagnosis not present

## 2021-10-14 DIAGNOSIS — I10 Essential (primary) hypertension: Secondary | ICD-10-CM | POA: Diagnosis not present

## 2021-10-14 DIAGNOSIS — R7303 Prediabetes: Secondary | ICD-10-CM | POA: Diagnosis not present

## 2021-10-14 DIAGNOSIS — E785 Hyperlipidemia, unspecified: Secondary | ICD-10-CM | POA: Diagnosis not present

## 2021-10-14 DIAGNOSIS — I25118 Atherosclerotic heart disease of native coronary artery with other forms of angina pectoris: Secondary | ICD-10-CM | POA: Diagnosis not present

## 2021-10-19 ENCOUNTER — Ambulatory Visit: Payer: Medicare HMO

## 2021-10-19 DIAGNOSIS — L814 Other melanin hyperpigmentation: Secondary | ICD-10-CM | POA: Insufficient documentation

## 2021-10-19 DIAGNOSIS — Z85828 Personal history of other malignant neoplasm of skin: Secondary | ICD-10-CM | POA: Insufficient documentation

## 2021-10-19 DIAGNOSIS — Z86007 Personal history of in-situ neoplasm of skin: Secondary | ICD-10-CM | POA: Insufficient documentation

## 2021-11-03 DIAGNOSIS — D0462 Carcinoma in situ of skin of left upper limb, including shoulder: Secondary | ICD-10-CM | POA: Diagnosis not present

## 2021-11-08 ENCOUNTER — Telehealth: Payer: Self-pay | Admitting: Family Medicine

## 2021-11-08 NOTE — Telephone Encounter (Signed)
Left message for patient to call back and schedule Medicare Annual Wellness Visit (AWV) to be completed by video or phone.   Last AWV: 10/18/2020  Please schedule at anytime with [facility] Nurse Health Advisor     45 minute appointment  Any questions, please contact me at [phone]      

## 2021-11-09 ENCOUNTER — Encounter: Payer: Self-pay | Admitting: Family Medicine

## 2021-11-09 ENCOUNTER — Ambulatory Visit (INDEPENDENT_AMBULATORY_CARE_PROVIDER_SITE_OTHER): Payer: Medicare HMO | Admitting: Family Medicine

## 2021-11-09 VITALS — BP 130/79 | HR 85 | Temp 97.8°F | Ht 66.0 in | Wt 176.6 lb

## 2021-11-09 DIAGNOSIS — B372 Candidiasis of skin and nail: Secondary | ICD-10-CM | POA: Diagnosis not present

## 2021-11-09 MED ORDER — NYSTATIN 100000 UNIT/GM EX POWD: 1.0000 "application " | Freq: Three times a day (TID) | CUTANEOUS | 0 refills | Status: DC

## 2021-11-09 NOTE — Progress Notes (Signed)
Assessment & Plan:  1. Candidal intertrigo Education provided on skin yeast infections. - nystatin (MYCOSTATIN/NYSTOP) powder; Apply 1 application. topically 3 (three) times daily.  Dispense: 60 g; Refill: 0   Follow up plan: Return as scheduled with PCP.  Hendricks Limes, MSN, APRN, FNP-C [facility]  Subjective:   Patient ID: [name], female    DOB: 1956-01-13, 66 y.o.   MRN: [id number]  HPI: [name] is a 66 y.o. female presenting on 11/09/2021 for Rash (Bilateral rash on sides x 4 days. Patient states it burns and she has ran low fever of 99.8.)  Patient reports a red, burning, itchy rash under her abdominal folds bilaterally that she noticed four days ago.    ROS: Negative unless specifically indicated above in HPI.   Relevant past medical history reviewed and updated as indicated.   Allergies and medications reviewed and updated.   Current Outpatient Medications:    alendronate (FOSAMAX) 70 MG tablet, Take 1 tablet (70 mg total) by mouth every 7 (seven) days. Take with a full glass of water on an empty stomach., Disp: 4 tablet, Rfl: 12   aspirin 81 MG chewable tablet, Chew 81 mg by mouth daily., Disp: , Rfl:    atenolol (TENORMIN) 25 MG tablet, Take 25 mg by mouth daily. , Disp: , Rfl: 2   cholecalciferol (VITAMIN D3) 25 MCG (1000 UT) tablet, Take 1,000 Units by mouth daily., Disp: , Rfl:    isosorbide mononitrate (IMDUR) 60 MG 24 hr tablet, Take 60 mg by mouth daily., Disp: , Rfl:    NARCAN 4 MG/0.1ML LIQD nasal spray kit, , Disp: , Rfl:    nitroGLYCERIN (NITROSTAT) 0.4 MG SL tablet, See admin instructions., Disp: , Rfl:    omeprazole (PRILOSEC) 20 MG capsule, TAKE 1 CAPSULE BY MOUTH EVERY DAY FOR 2 TO 4 WEEKS PER FLARE, Disp: 90 capsule, Rfl: 0   oxyCODONE-acetaminophen (PERCOCET) 10-325 MG tablet, Take 1 tablet by mouth every 8 (eight) hours as needed for pain., Disp: 90 tablet, Rfl: 0   oxyCODONE-acetaminophen (PERCOCET) 10-325 MG  tablet, Take 1 tablet by mouth every 8 (eight) hours as needed for pain., Disp: 90 tablet, Rfl: 0   oxyCODONE-acetaminophen (PERCOCET) 10-325 MG tablet, Take 1 tablet by mouth every 8 (eight) hours as needed for pain., Disp: 90 tablet, Rfl: 0   rosuvastatin (CRESTOR) 5 MG tablet, Take 5 mg by mouth daily., Disp: , Rfl:    triamcinolone cream (KENALOG) 0.1 %, Apply topically 2 (two) times daily., Disp: , Rfl:    vitamin C (ASCORBIC ACID) 500 MG tablet, Take 500 mg by mouth daily., Disp: , Rfl:    amitriptyline (ELAVIL) 100 MG tablet, Take 0.5-1 tablets (50-100 mg total) by mouth at bedtime. (Patient not taking: Reported on 11/09/2021), Disp: 90 tablet, Rfl: 0   furosemide (LASIX) 20 MG tablet, TAKE 1/2 TO 1 TABLET BY MOUTH ONCE DAILY AS NEEDED FOR FLUID OF EDEMA (Patient not taking: Reported on 11/09/2021), Disp: 90 tablet, Rfl: 0   zolpidem (AMBIEN) 5 MG tablet, Take 1 tablet (5 mg total) by mouth at bedtime as needed for sleep. (Patient not taking: Reported on 11/09/2021), Disp: 30 tablet, Rfl: 5  Allergies  Allergen Reactions   Gabapentin     Dizziness and blurred vision    Objective:   BP 130/79   Pulse 85   Temp 97.8 F (36.6 C) (Temporal)   Ht '5\' 6"'  (1.676 m)   Wt 176 lb 9.6 oz (80.1  kg)   SpO2 97%   BMI 28.50 kg/m    Physical Exam Vitals reviewed.  Constitutional:      General: She is not in acute distress.    Appearance: Normal appearance. She is not ill-appearing, toxic-appearing or diaphoretic.  HENT:     Head: Normocephalic and atraumatic.  Eyes:     General: No scleral icterus.       Right eye: No discharge.        Left eye: No discharge.     Conjunctiva/sclera: Conjunctivae normal.  Cardiovascular:     Rate and Rhythm: Normal rate.  Pulmonary:     Effort: Pulmonary effort is normal. No respiratory distress.  Musculoskeletal:        General: Normal range of motion.     Cervical back: Normal range of motion.  Skin:    General: Skin is warm and dry.     Capillary  Refill: Capillary refill takes less than 2 seconds.     Findings: Erythema (under abdominal fold on the sides) present.  Neurological:     General: No focal deficit present.     Mental Status: She is alert and oriented to person, place, and time. Mental status is at baseline.  Psychiatric:        Mood and Affect: Mood normal.        Behavior: Behavior normal.        Thought Content: Thought content normal.        Judgment: Judgment normal.

## 2021-11-14 DIAGNOSIS — L304 Erythema intertrigo: Secondary | ICD-10-CM | POA: Diagnosis not present

## 2021-11-15 ENCOUNTER — Ambulatory Visit (INDEPENDENT_AMBULATORY_CARE_PROVIDER_SITE_OTHER): Payer: Medicare HMO | Admitting: Family Medicine

## 2021-11-15 ENCOUNTER — Encounter: Payer: Self-pay | Admitting: Family Medicine

## 2021-11-15 VITALS — BP 126/77 | HR 69 | Temp 98.0°F | Ht 66.0 in | Wt 181.6 lb

## 2021-11-15 DIAGNOSIS — F5101 Primary insomnia: Secondary | ICD-10-CM

## 2021-11-15 DIAGNOSIS — Z91148 Patient's other noncompliance with medication regimen for other reason: Secondary | ICD-10-CM

## 2021-11-15 DIAGNOSIS — M5136 Other intervertebral disc degeneration, lumbar region: Secondary | ICD-10-CM

## 2021-11-15 DIAGNOSIS — R69 Illness, unspecified: Secondary | ICD-10-CM | POA: Diagnosis not present

## 2021-11-15 MED ORDER — AMITRIPTYLINE HCL 100 MG PO TABS: 50.0000 mg | ORAL_TABLET | Freq: Every day | ORAL | 3 refills | Status: DC

## 2021-11-15 MED ORDER — PRAZOSIN HCL 1 MG PO CAPS: 1.0000 mg | ORAL_CAPSULE | Freq: Every evening | ORAL | 12 refills | Status: DC | PRN

## 2021-11-15 NOTE — Progress Notes (Signed)
Subjective: CC: Chronic pain, insomnia PCP: Janora Norlander, DO KKX:FGHWE [name] is a 66 y.o. female presenting to clinic today for:  1.  Chronic pain, insomnia Patient was last seen a few months ago for chronic pain and insomnia.  She had been prescribed Percocet 10 mg 3 times daily and Ambien 5 mg nightly as needed.  However, on her last urine drug screen she was positive for a benzodiazepine that she is not prescribed.  After lengthy conversation, we could not determine where this medication was coming from.  She had denied any use of old prescription Valium from her previous PCP.  Decision was made to terminate her controlled substance contract and a wean from Percocet and Ambien were recommended.  I offered referral to pain management at that time but she declined.    She continues to decline pain management referral today because she had a bad experience with [facility] in [place].  She does admit to ongoing chronic pain that is not well controlled.  She apparently self tapered from the Elavil as well as she thought that she had to come off of it.  She would like to resume use of that if possible.  She continues to have quite a bit of difficulty with sleep off of the Ambien that is refractory to over-the-counter supplements.  She has never been on anything but Ambien for insomnia so she is willing to try something else.  She goes on to inform me that she thinks that she may have taken an old Valium that was mixed in with a baby aspirin back in December.  She denies having taken anymore since December when she was having some chest pain.  Of note her toxassure was obtained 06/15/2021.   ROS: Per HPI  Allergies  Allergen Reactions   Gabapentin     Dizziness and blurred vision   Past Medical History:  Diagnosis Date   Anxiety    Arthritis    GERD (gastroesophageal reflux disease)    Hyperlipidemia    Hypertension     Current Outpatient Medications:     alendronate (FOSAMAX) 70 MG tablet, Take 1 tablet (70 mg total) by mouth every 7 (seven) days. Take with a full glass of water on an empty stomach., Disp: 4 tablet, Rfl: 12   aspirin 81 MG chewable tablet, Chew 81 mg by mouth daily., Disp: , Rfl:    atenolol (TENORMIN) 25 MG tablet, Take 25 mg by mouth daily. , Disp: , Rfl: 2   cholecalciferol (VITAMIN D3) 25 MCG (1000 UT) tablet, Take 1,000 Units by mouth daily., Disp: , Rfl:    furosemide (LASIX) 20 MG tablet, TAKE 1/2 TO 1 TABLET BY MOUTH ONCE DAILY AS NEEDED FOR FLUID OF EDEMA, Disp: 90 tablet, Rfl: 0   isosorbide mononitrate (IMDUR) 60 MG 24 hr tablet, Take 60 mg by mouth daily., Disp: , Rfl:    NARCAN 4 MG/0.1ML LIQD nasal spray kit, , Disp: , Rfl:    nitroGLYCERIN (NITROSTAT) 0.4 MG SL tablet, See admin instructions., Disp: , Rfl:    nystatin (MYCOSTATIN/NYSTOP) powder, Apply 1 application. topically 3 (three) times daily., Disp: 60 g, Rfl: 0   omeprazole (PRILOSEC) 20 MG capsule, TAKE 1 CAPSULE BY MOUTH EVERY DAY FOR 2 TO 4 WEEKS PER FLARE, Disp: 90 capsule, Rfl: 0   oxyCODONE-acetaminophen (PERCOCET) 10-325 MG tablet, Take 1 tablet by mouth every 8 (eight) hours as needed for pain., Disp: 90 tablet, Rfl: 0   oxyCODONE-acetaminophen (PERCOCET)  10-325 MG tablet, Take 1 tablet by mouth every 8 (eight) hours as needed for pain., Disp: 90 tablet, Rfl: 0   oxyCODONE-acetaminophen (PERCOCET) 10-325 MG tablet, Take 1 tablet by mouth every 8 (eight) hours as needed for pain., Disp: 90 tablet, Rfl: 0   rosuvastatin (CRESTOR) 5 MG tablet, Take 5 mg by mouth daily., Disp: , Rfl:    triamcinolone cream (KENALOG) 0.1 %, Apply topically 2 (two) times daily., Disp: , Rfl:    vitamin C (ASCORBIC ACID) 500 MG tablet, Take 500 mg by mouth daily., Disp: , Rfl:  Social History   Socioeconomic History   Marital status: Married    Spouse name: Not on file   Number of children: 2   Years of education: Not on file   Highest education level: Not on file   Occupational History   Occupation: disability  Tobacco Use   Smoking status: Never   Smokeless tobacco: Never  Vaping Use   Vaping Use: Never used  Substance and Sexual Activity   Alcohol use: No   Drug use: No   Sexual activity: Not on file  Other Topics Concern   Not on file  Social History Narrative   Lives with her husband. Children and grandchildren live within 52 miles   Social Determinants of Health   Financial Resource Strain: Low Risk  (10/18/2020)   Overall Financial Resource Strain (CARDIA)    Difficulty of Paying Living Expenses: Not hard at all  Food Insecurity: No Food Insecurity (10/18/2020)   Hunger Vital Sign    Worried About Running Out of Food in the Last Year: Never true    Ran Out of Food in the Last Year: Never true  Transportation Needs: No Transportation Needs (10/18/2020)   PRAPARE - Hydrologist (Medical): No    Lack of Transportation (Non-Medical): No  Physical Activity: Insufficiently Active (10/18/2020)   Exercise Vital Sign    Days of Exercise per Week: 7 days    Minutes of Exercise per Session: 10 min  Stress: No Stress Concern Present (10/18/2020)   [place]    Feeling of Stress : Only a little  Social Connections: Moderately Isolated (10/18/2020)   Social Connection and Isolation Panel [NHANES]    Frequency of Communication with Friends and Family: More than three times a week    Frequency of Social Gatherings with Friends and Family: More than three times a week    Attends Religious Services: Never    Marine scientist or Organizations: No    Attends Archivist Meetings: Never    Marital Status: Married  Human resources officer Violence: Not At Risk (10/18/2020)   Humiliation, Afraid, Rape, and Kick questionnaire    Fear of Current or Ex-Partner: No    Emotionally Abused: No    Physically Abused: No    Sexually Abused: No   Family  History  Problem Relation Age of Onset   Congestive Heart Failure Mother    Arthritis Mother    Cancer Father        lung   Arthritis/Rheumatoid Maternal Grandmother    Cancer Paternal Grandfather        skin    Objective: Office vital signs reviewed. BP 126/77   Pulse 69   Temp 98 F (36.7 C)   Ht 5' 6"  (1.676 m)   Wt 181 lb 9.6 oz (82.4 kg)   SpO2 97%   BMI 29.31  kg/m   Physical Examination:  General: Awake, alert, nontoxic female, No acute distress HEENT: Sclera white Cardio: regular rate and rhythm Pulm: Normal work of breathing on room air. MSK: Antalgic gait and station.  Requiring cane for ambulation Psych: Mood is stable but patient is visibly upset during the conversation regarding her controlled substances  Assessment/ Plan: 66 y.o. female   DDD (degenerative disc disease), lumbar - Plan: amitriptyline (ELAVIL) 100 MG tablet  Primary insomnia - Plan: prazosin (MINIPRESS) 1 MG capsule  Violation of controlled substance agreement  Elavil has been renewed.  This unfortunately is only thing that I can give this patient at this time given the violation of her controlled substance contract.  Even if in fact her old value was mixed in with her aspirin, average half-life of benzodiazepines is at max 8 days.  In the absence of consistent use of a benzodiazepine I would not expect her levels to be elevated on a urine drug screen with a singular dose taken sometime in December.  This unfortunately does tie my hands with regards to ongoing treatment with controlled substances.  I offered her on multiple occasions for referral to pain management but she has apparently had some adverse experiences with pain management in the past and does not wish to revisit that avenue just yet.  I have prescribed prazosin in lieu of the Ambien for assistance with sleep.  I considered trazodone but given use of the Elavil I felt that this would put her at too high risk for QTc prolongation.   Alternative therapies considered in fact were Rozerem but it does not appear that her insurance will cover this medication.  No orders of the defined types were placed in this encounter.  No orders of the defined types were placed in this encounter.    Janora Norlander, DO [place] [phone]

## 2021-11-15 NOTE — Patient Instructions (Signed)
If you change your mind about the pain specialist, please let me know.  I have a few patients that go to one in [facility] that they really like  I have replaced the ambien with prazosin for sleep.  Hopefully, this works well for you  Not sure why you were told to wean from the Amitriptyline.  That is not controlled and you may continue to get that from me, so I have renewed it for you.

## 2021-12-08 ENCOUNTER — Telehealth: Payer: Self-pay | Admitting: Family Medicine

## 2021-12-08 DIAGNOSIS — K219 Gastro-esophageal reflux disease without esophagitis: Secondary | ICD-10-CM

## 2021-12-08 MED ORDER — OMEPRAZOLE 20 MG PO CPDR: DELAYED_RELEASE_CAPSULE | ORAL | 0 refills | Status: DC

## 2021-12-08 NOTE — Telephone Encounter (Signed)
Patient aware.

## 2021-12-08 NOTE — Telephone Encounter (Signed)
  Prescription Request  12/08/2021  Is this a "Controlled Substance" medicine? no  Have you seen your PCP in the last 2 weeks? Last seen 11/15/21 next appt 03/17/2022  If YES, route message to pool  -  If NO, patient needs to be scheduled for appointment.  What is the name of the medication or equipment? omeprazole (PRILOSEC) 20 MG capsule   Have you contacted your pharmacy to request a refill? yes   Which pharmacy would you like this sent to? [facility]   Patient notified that their request is being sent to the clinical staff for review and that they should receive a response within 2 business days.

## 2021-12-29 ENCOUNTER — Telehealth: Payer: Self-pay | Admitting: Family Medicine

## 2021-12-29 NOTE — Telephone Encounter (Signed)
Left message for patient to call back and schedule Medicare Annual Wellness Visit (AWV) to be completed by video or phone.   Last AWV: 10/18/2020  Please schedule at anytime with [place]     45 minute appointment  Any questions, please contact me at [phone]

## 2022-01-10 DIAGNOSIS — E785 Hyperlipidemia, unspecified: Secondary | ICD-10-CM | POA: Diagnosis not present

## 2022-01-10 DIAGNOSIS — R7303 Prediabetes: Secondary | ICD-10-CM | POA: Diagnosis not present

## 2022-01-10 DIAGNOSIS — I10 Essential (primary) hypertension: Secondary | ICD-10-CM | POA: Diagnosis not present

## 2022-01-11 ENCOUNTER — Other Ambulatory Visit: Payer: Self-pay | Admitting: *Deleted

## 2022-01-11 NOTE — Patient Outreach (Signed)
  Care Coordination   Initial Visit Note   01/11/2022 Name: FAVIOLA KLARE MRN: [id number] DOB: 03-22-1956  GURNOOR SLOOP is a 66 y.o. year old female who sees Janora Norlander, DO for primary care. I spoke with  [name] by phone today  RN discussed services [facility] services, RN, SW, and Pharmacist. Patient declined services.   SDOH assessments and interventions completed:  No    Care Coordination Interventions Activated:  No  Care Coordination Interventions:  No, not indicated   Follow up plan: No further intervention required.   Encounter Outcome:  Pt. [facility] [phone]

## 2022-01-13 DIAGNOSIS — E785 Hyperlipidemia, unspecified: Secondary | ICD-10-CM | POA: Diagnosis not present

## 2022-01-13 DIAGNOSIS — I10 Essential (primary) hypertension: Secondary | ICD-10-CM | POA: Diagnosis not present

## 2022-01-13 DIAGNOSIS — I25118 Atherosclerotic heart disease of native coronary artery with other forms of angina pectoris: Secondary | ICD-10-CM | POA: Diagnosis not present

## 2022-01-13 DIAGNOSIS — R7303 Prediabetes: Secondary | ICD-10-CM | POA: Diagnosis not present

## 2022-02-10 ENCOUNTER — Other Ambulatory Visit: Payer: Self-pay | Admitting: Family Medicine

## 2022-02-10 DIAGNOSIS — K219 Gastro-esophageal reflux disease without esophagitis: Secondary | ICD-10-CM

## 2022-03-17 ENCOUNTER — Ambulatory Visit: Payer: Medicare HMO | Admitting: Family Medicine

## 2022-04-14 DIAGNOSIS — E785 Hyperlipidemia, unspecified: Secondary | ICD-10-CM | POA: Diagnosis not present

## 2022-04-14 DIAGNOSIS — I25118 Atherosclerotic heart disease of native coronary artery with other forms of angina pectoris: Secondary | ICD-10-CM | POA: Diagnosis not present

## 2022-04-14 DIAGNOSIS — I10 Essential (primary) hypertension: Secondary | ICD-10-CM | POA: Diagnosis not present

## 2022-04-14 DIAGNOSIS — R7303 Prediabetes: Secondary | ICD-10-CM | POA: Diagnosis not present

## 2022-05-09 ENCOUNTER — Other Ambulatory Visit: Payer: Self-pay | Admitting: Family Medicine

## 2022-05-09 DIAGNOSIS — K219 Gastro-esophageal reflux disease without esophagitis: Secondary | ICD-10-CM

## 2022-05-22 ENCOUNTER — Other Ambulatory Visit: Payer: Self-pay | Admitting: Family Medicine

## 2022-05-22 DIAGNOSIS — K219 Gastro-esophageal reflux disease without esophagitis: Secondary | ICD-10-CM

## 2022-05-22 NOTE — Telephone Encounter (Signed)
[place] NTBS 30 days given 05/09/22

## 2022-05-22 NOTE — Telephone Encounter (Signed)
Scheduled pt to see PCP on 07/03/22 for med refill. Left message making pt aware.

## 2022-07-03 ENCOUNTER — Ambulatory Visit: Payer: Medicare HMO | Admitting: Family Medicine

## 2022-07-21 ENCOUNTER — Ambulatory Visit: Payer: Medicare HMO | Admitting: Family Medicine

## 2022-07-24 ENCOUNTER — Telehealth: Payer: Self-pay | Admitting: Family Medicine

## 2022-07-24 NOTE — Telephone Encounter (Signed)
Called patient to schedule Medicare Annual Wellness Visit (AWV). Left message for patient to call back and schedule Medicare Annual Wellness Visit (AWV).  Last date of AWV: 10/18/2020   Please schedule an appointment at any time with [facility].  If any questions, please contact me at [phone].  Thank you,  [place] ??[phone]

## 2022-08-04 ENCOUNTER — Other Ambulatory Visit: Payer: Self-pay | Admitting: Family Medicine

## 2022-08-04 DIAGNOSIS — I209 Angina pectoris, unspecified: Secondary | ICD-10-CM

## 2022-09-22 ENCOUNTER — Telehealth: Payer: Self-pay | Admitting: Family Medicine

## 2022-09-22 NOTE — Telephone Encounter (Signed)
Called patient to schedule Medicare Annual Wellness Visit (AWV). No voicemail available to leave a message.  Last date of AWV: 10/18/2020   Please schedule an appointment at any time with either Vernona Rieger or [place], [facility]'s. .  If any questions, please contact me at [phone].  Thank you,  Judeth Cornfield,  AMB Clinical Support [facility] AWV Program Direct Dial ??[phone]

## 2022-09-26 ENCOUNTER — Ambulatory Visit (INDEPENDENT_AMBULATORY_CARE_PROVIDER_SITE_OTHER): Payer: Medicare HMO | Admitting: Family

## 2022-09-26 ENCOUNTER — Encounter: Payer: Self-pay | Admitting: Family

## 2022-09-26 VITALS — BP 126/81 | HR 85 | Temp 97.7°F | Ht 66.0 in | Wt 186.0 lb

## 2022-09-26 DIAGNOSIS — S30861A Insect bite (nonvenomous) of abdominal wall, initial encounter: Secondary | ICD-10-CM

## 2022-09-26 DIAGNOSIS — W57XXXA Bitten or stung by nonvenomous insect and other nonvenomous arthropods, initial encounter: Secondary | ICD-10-CM | POA: Diagnosis not present

## 2022-09-26 MED ORDER — DOXYCYCLINE HYCLATE 100 MG PO TABS: 100.0000 mg | ORAL_TABLET | Freq: Two times a day (BID) | ORAL | 0 refills | Status: DC

## 2022-09-26 NOTE — Progress Notes (Signed)
   Subjective:    Patient ID: [name], female    DOB: 09-27-1955, 67 y.o.   MRN: [id number]  Chief Complaint  Patient presents with   Tick Removal    Tick bite     HPI PT presents to the office today after removing a tick bite on right flank 5 days ago. Reports she has now noticed increased redness, tenderness, and itching. Reports headache and fatigue. Reports she has joint pain all the time and can not tell if she has any new joint pain.    Review of Systems  All other systems reviewed and are negative.      Objective:   Physical Exam Vitals reviewed.  Constitutional:      General: She is not in acute distress.    Appearance: She is well-developed.  HENT:     Head: Normocephalic and atraumatic.  Eyes:     Pupils: Pupils are equal, round, and reactive to light.  Neck:     Thyroid: No thyromegaly.  Cardiovascular:     Rate and Rhythm: Normal rate and regular rhythm.     Heart sounds: Normal heart sounds. No murmur heard. Pulmonary:     Effort: Pulmonary effort is normal. No respiratory distress.     Breath sounds: Normal breath sounds. No wheezing.  Abdominal:     General: Bowel sounds are normal. There is no distension.     Palpations: Abdomen is soft.     Tenderness: There is no abdominal tenderness.  Musculoskeletal:        General: No tenderness. Normal range of motion.     Cervical back: Normal range of motion and neck supple.  Skin:    General: Skin is warm and dry.     Findings: Erythema present.     Comments: Erythemas approx 9X14 cm   Neurological:     Mental Status: She is alert and oriented to person, place, and time.     Cranial Nerves: No cranial nerve deficit.     Deep Tendon Reflexes: Reflexes are normal and symmetric.  Psychiatric:        Behavior: Behavior normal.        Thought Content: Thought content normal.        Judgment: Judgment normal.      BP 126/81   Pulse 85   Temp 97.7 F (36.5 C) (Temporal)   Ht  (1.676 m)   Wt  186 lb (84.4 kg)   SpO2 97%   BMI 30.02 kg/m       Assessment & Plan:   [name] comes in today with chief complaint of Tick Removal (Tick bite )   Diagnosis and orders addressed:  1. Tick bite of right flank, initial encounter Start doxycycline  -Pt to report any new fever, joint pain, or rash -Wear protective clothing while outside- Long sleeves and long pants -Put insect repellent on all exposed skin and along clothing -Take a shower as soon as possible after being outside Area marked, let us know if erythemas worsen or does not improve  -Follow up if symptoms worsen or do not improve  - doxycycline (VIBRA-TABS) 100 MG tablet; Take 1 tablet (100 mg total) by mouth 2 (two) times daily.  Dispense: 42 tablet; Refill: 0   Jannifer Rodney, FNP

## 2022-09-26 NOTE — Patient Instructions (Signed)
Tick Bite Information, Adult  Ticks are insects that draw blood for food. They climb onto people and animals that brush against the leaves and grasses that they live in. They then bite and attach to the skin. Most ticks are harmless, but some ticks may carry germs that can cause disease. These germs are spread to a person through a bite. To lower your risk of getting a disease from a tick bite, make sure you: Take steps to prevent tick bites. Check for ticks after being outdoors where ticks live. Watch for symptoms of disease if a tick attached to you or if you think a tick bit you. How can I prevent tick bites? Take these steps to help prevent tick bites when you go outdoors in an area where ticks live: Before you go outdoors: Wear long sleeves and long pants to protect your skin from ticks. Wear light-colored clothing so you can see ticks easier. Tuck your pant legs into your socks. Apply insect repellent that has DEET (20% or higher), picaridin, or IR3535 in it to the following areas: Any bare skin. Avoid areas around the eyes and mouth. Edges of clothing, like the top of your boots, the bottom of your pant legs, and your sleeve cuffs. Consider applying an insect repellant that contains permethrin. Follow the instructions on the label. Do not apply permethrin directly to the skin. Instead, apply to the following areas: Clothing and shoes. Outdoor gear and tents. When you are outdoors: Avoid walking through areas with long grass. If you are walking on a trail, stay in the middle of the trail so your skin, hair, and clothing do not touch the bushes. Check for ticks on your clothing, hair, and skin often while you are outdoors. Check again before you go inside. When you go indoors: Check your clothing for ticks. Tumble dry clothes in a dryer on high heat for at least 10 minutes. If clothes are damp, additional time may be needed. If clothes require washing, use hot water. Check your gear and  pets. Shower soon after being outdoors. Check your body for ticks. Do a full body check using a mirror. Be sure to check your scalp, neck, armpits, waist, groin, and joint areas. These are the spots where ticks attach themselves most often. What is the best way to remove a tick?  Remove the tick as soon as possible. Removing it can prevent germs from passing to your body. Do not remove the tick with your bare fingers. Do not try to remove a tick with heat, alcohol, petroleum jelly, or fingernail polish. These things can cause the tick to salivate and regurgitate into your bloodstream, increasing your risk of getting a disease. To remove a tick that is crawling on your skin: Go outside and brush the tick off. Use tape or a lint roller. To remove a tick that is attached to your skin: Wash your hands. If you have gloves, put them on. Use a fine-tipped tweezer, curved forceps, or a tick-removal tool to gently grasp the tick as close to your skin and the tick's head as possible. Gently pull with a steady, upward, and even pressure until the tick lets go. While removing the tick: Take care to keep the tick's head attached to its body. Do not twist or jerk the tick. This can make the tick's head or mouth parts break off and stay in your skin. If this happens, try to remove the mouth parts with tweezers. If you cannot remove them, leave   the area alone and let the skin heal. Do not squeeze or crush the tick's body. This could force disease-carrying fluids from the tick into your body. What should I do after removing a tick? Clean the bite area and your hands with soap and water, rubbing alcohol, or an iodine scrub. If an antiseptic cream or ointment is available, put a small amount on the bite area. Wash and disinfect any tools that you used to remove the tick. How should I dispose of a tick? To dispose of a live tick, use one of these methods: Place it in rubbing alcohol. Place it in a sealed bag  or container, and throw it away. Wrap it tightly in tape, and throw it away. Flush it down the toilet. Where to find more information Centers for Disease Control and Prevention: cdc.gov/ticks [place] Environmental Protection Agency: epa.gov/insect-repellents Contact a health care provider if: You have symptoms of a disease after a tick bite. Symptoms of a tick-borne disease can occur from moments after the tick bites to 30 days after a tick is removed. Symptoms include: Fever or chills. A red rash that makes a circle (bull's-eye rash) in the bite area. Redness and swelling in the bite area. Headache or stiff neck. Muscle, joint, or bone pain. Abnormal tiredness. Numbness in your legs or trouble walking or moving your legs. Tender or swollen lymph glands. Abdominal pain, vomiting, diarrhea, or weight loss. Get help right away if: You are not able to remove a tick. You have muscle weakness or paralysis. Your symptoms get worse or you experience new symptoms. You find an engorged tick on your skin and you are in an area where there is a higher risk of disease from ticks. Summary Ticks may carry germs that can spread to a person through a bite. These germs can cause disease. Wear protective clothing and use insect repellent to prevent tick bites. Follow the instructions on the label. If you find a tick on your body, remove it as soon as possible. If the tick is attached, do not try to remove it with heat, alcohol, petroleum jelly, or fingernail polish. If you have symptoms of a disease after being bitten by a tick, contact a health care provider. This information is not intended to replace advice given to you by your health care provider. Make sure you discuss any questions you have with your health care provider. Document Revised: 08/22/2021 Document Reviewed: 08/22/2021 Elsevier Patient Education  2023 Elsevier Inc.  

## 2022-10-20 DIAGNOSIS — M1610 Unilateral primary osteoarthritis, unspecified hip: Secondary | ICD-10-CM | POA: Diagnosis not present

## 2022-10-20 DIAGNOSIS — E782 Mixed hyperlipidemia: Secondary | ICD-10-CM | POA: Diagnosis not present

## 2022-10-20 DIAGNOSIS — I10 Essential (primary) hypertension: Secondary | ICD-10-CM | POA: Diagnosis not present

## 2022-10-20 DIAGNOSIS — R7303 Prediabetes: Secondary | ICD-10-CM | POA: Diagnosis not present

## 2022-10-29 ENCOUNTER — Other Ambulatory Visit: Payer: Self-pay | Admitting: Family Medicine

## 2022-10-29 DIAGNOSIS — M5136 Other intervertebral disc degeneration, lumbar region: Secondary | ICD-10-CM

## 2022-11-07 ENCOUNTER — Telehealth: Payer: Self-pay | Admitting: Family Medicine

## 2023-01-17 DIAGNOSIS — I10 Essential (primary) hypertension: Secondary | ICD-10-CM | POA: Diagnosis not present

## 2023-01-17 DIAGNOSIS — E785 Hyperlipidemia, unspecified: Secondary | ICD-10-CM | POA: Diagnosis not present

## 2023-01-17 DIAGNOSIS — R7303 Prediabetes: Secondary | ICD-10-CM | POA: Diagnosis not present

## 2023-01-19 DIAGNOSIS — I10 Essential (primary) hypertension: Secondary | ICD-10-CM | POA: Diagnosis not present

## 2023-01-19 DIAGNOSIS — E119 Type 2 diabetes mellitus without complications: Secondary | ICD-10-CM | POA: Diagnosis not present

## 2023-01-19 DIAGNOSIS — E782 Mixed hyperlipidemia: Secondary | ICD-10-CM | POA: Diagnosis not present

## 2023-01-19 DIAGNOSIS — M150 Primary generalized (osteo)arthritis: Secondary | ICD-10-CM | POA: Diagnosis not present

## 2023-02-02 ENCOUNTER — Ambulatory Visit (INDEPENDENT_AMBULATORY_CARE_PROVIDER_SITE_OTHER): Payer: Medicare HMO

## 2023-02-02 VITALS — Ht 65.0 in | Wt 174.0 lb

## 2023-02-02 DIAGNOSIS — Z0000 Encounter for general adult medical examination without abnormal findings: Secondary | ICD-10-CM

## 2023-02-02 NOTE — Progress Notes (Signed)
Subjective:   [name] is a 67 y.o. female who presents for Medicare Annual (Subsequent) preventive examination.  Visit Complete: Virtual  I connected with  Manfred Arch on 02/02/23 by a audio enabled telemedicine application and verified that I am speaking with the correct person using two identifiers.  Patient Location: Home  Provider Location: Home Office  I discussed the limitations of evaluation and management by telemedicine. The patient expressed understanding and agreed to proceed.  Patient Medicare AWV questionnaire was completed by the patient on 02/02/2023; I have confirmed that all information answered by patient is correct and no changes since this date.  Review of Systems    Vital Signs: Unable to obtain new vitals due to this being a telehealth visit.  Cardiac Risk Factors include: advanced age (>73men, >32 women);diabetes mellitus;dyslipidemia;hypertension;sedentary lifestyleNutrition Risk Assessment:  Has the patient had any N/V/D within the last 2 months?  No  Does the patient have any non-healing wounds?  No  Has the patient had any unintentional weight loss or weight gain?  No   Diabetes:  Is the patient diabetic?  Yes  If diabetic, was a CBG obtained today?  No  Did the patient bring in their glucometer from home?  No  How often do you monitor your CBG's? LIBRE.   Financial Strains and Diabetes Management:  Are you having any financial strains with the device, your supplies or your medication? No .  Does the patient want to be seen by Chronic Care Management for management of their diabetes?  No  Would the patient like to be referred to a Nutritionist or for Diabetic Management?  No   Diabetic Exams:  Diabetic Eye Exam: Completed 03/2022 Diabetic Foot Exam: Overdue, Pt has been advised about the importance in completing this exam. Pt is scheduled for diabetic foot exam on next office visit .      Objective:    Today's Vitals   02/02/23 1535   Weight: 174 lb (78.9 kg)  Height: 5\' 5"  (1.651 m)   Body mass index is 28.96 kg/m.     02/02/2023    3:41 PM 10/18/2020    4:38 PM 04/12/2018    2:05 PM 01/10/2018    1:55 PM  Advanced Directives  Does Patient Have a Medical Advance Directive? No No No No  Would patient like information on creating a medical advance directive? Yes (MAU/Ambulatory/Procedural Areas - Information given) No - Patient declined  No - Patient declined    Current Medications (verified) Outpatient Encounter Medications as of 02/02/2023  Medication Sig   aspirin 81 MG chewable tablet Chew 81 mg by mouth daily.   atenolol (TENORMIN) 25 MG tablet Take 25 mg by mouth daily.    cholecalciferol (VITAMIN D3) 25 MCG (1000 UT) tablet Take 1,000 Units by mouth daily.   Continuous Glucose Receiver (FREESTYLE LIBRE 3 READER) DEVI See admin instructions.   furosemide (LASIX) 20 MG tablet TAKE 1/2 TO 1 TABLET BY MOUTH ONCE DAILY AS NEEDED FOR FLUID OF EDEMA   isosorbide mononitrate (IMDUR) 60 MG 24 hr tablet Take 60 mg by mouth daily.   metFORMIN (GLUCOPHAGE) 500 MG tablet Take 500 mg by mouth 2 (two) times daily.   NARCAN 4 MG/0.1ML LIQD nasal spray kit    nitroGLYCERIN (NITROSTAT) 0.4 MG SL tablet See admin instructions.   nystatin (MYCOSTATIN/NYSTOP) powder Apply 1 application. topically 3 (three) times daily.   omeprazole (PRILOSEC) 20 MG capsule TAKE 1 CAPSULE BY MOUTH EVERY DAY FOR  2 TO 4 WEEKS PER FLARE (NEEDS TO BE SEEN BEFORE NEXT REFILL)   prazosin (MINIPRESS) 1 MG capsule Take 1 capsule (1 mg total) by mouth at bedtime as needed (sleep).   rosuvastatin (CRESTOR) 5 MG tablet Take 5 mg by mouth daily.   triamcinolone cream (KENALOG) 0.1 % Apply topically 2 (two) times daily.   vitamin C (ASCORBIC ACID) 500 MG tablet Take 500 mg by mouth daily.   alendronate (FOSAMAX) 70 MG tablet Take 1 tablet (70 mg total) by mouth every 7 (seven) days. Take with a full glass of water on an empty stomach. (Patient not taking:  Reported on 02/02/2023)   amitriptyline (ELAVIL) 100 MG tablet Take 0.5-1 tablets (50-100 mg total) by mouth at bedtime. (Patient not taking: Reported on 02/02/2023)   doxycycline (VIBRA-TABS) 100 MG tablet Take 1 tablet (100 mg total) by mouth 2 (two) times daily. (Patient not taking: Reported on 02/02/2023)   No facility-administered encounter medications on file as of 02/02/2023.    Allergies (verified) Gabapentin   History: Past Medical History:  Diagnosis Date   Anxiety    Arthritis    GERD (gastroesophageal reflux disease)    Hyperlipidemia    Hypertension    Past Surgical History:  Procedure Laterality Date   ABDOMINAL HYSTERECTOMY     CERVICAL FUSION     KNEE ARTHROSCOPY Left    LUMBAR FUSION     skin cancer removal  09/25/2019   right thigh   Family History  Problem Relation Age of Onset   Congestive Heart Failure Mother    Arthritis Mother    Cancer Father        lung   Arthritis/Rheumatoid Maternal Grandmother    Cancer Paternal Grandfather        skin   Social History   Socioeconomic History   Marital status: Married    Spouse name: Not on file   Number of children: 2   Years of education: Not on file   Highest education level: Not on file  Occupational History   Occupation: disability  Tobacco Use   Smoking status: Never   Smokeless tobacco: Never  Vaping Use   Vaping status: Never Used  Substance and Sexual Activity   Alcohol use: No   Drug use: No   Sexual activity: Not on file  Other Topics Concern   Not on file  Social History Narrative   Lives with her husband. Children and grandchildren live within 10 miles   Social Determinants of Health   Financial Resource Strain: Low Risk  (02/02/2023)   Overall Financial Resource Strain (CARDIA)    Difficulty of Paying Living Expenses: Not hard at all  Food Insecurity: No Food Insecurity (02/02/2023)   Hunger Vital Sign    Worried About Running Out of Food in the Last Year: Never true    Ran Out  of Food in the Last Year: Never true  Transportation Needs: No Transportation Needs (02/02/2023)   PRAPARE - Administrator, Civil Service (Medical): No    Lack of Transportation (Non-Medical): No  Physical Activity: Inactive (02/02/2023)   Exercise Vital Sign    Days of Exercise per Week: 0 days    Minutes of Exercise per Session: 0 min  Stress: No Stress Concern Present (02/02/2023)   [employer] of Occupational Health - Occupational Stress Questionnaire    Feeling of Stress : Not at all  Social Connections: Moderately Isolated (02/02/2023)   Social Connection and Isolation Panel [NHANES]  Frequency of Communication with Friends and Family: More than three times a week    Frequency of Social Gatherings with Friends and Family: More than three times a week    Attends Religious Services: Never    Database administrator or Organizations: No    Attends Engineer, structural: Never    Marital Status: Married    Tobacco Counseling Counseling given: Not Answered   Clinical Intake:  Pre-visit preparation completed: Yes  Pain : No/denies pain     Nutritional Risks: None Diabetes: Yes CBG done?: No Did pt. bring in CBG monitor from home?: No  How often do you need to have someone help you when you read instructions, pamphlets, or other written materials from your doctor or pharmacy?: 1 - Never  Interpreter Needed?: No  Information entered by :: Renie Ora, LPN   Activities of Daily Living    02/02/2023    3:41 PM  In your present state of health, do you have any difficulty performing the following activities:  Hearing? 0  Vision? 0  Difficulty concentrating or making decisions? 0  Walking or climbing stairs? 0  Dressing or bathing? 0  Doing errands, shopping? 0  Preparing Food and eating ? N  Using the Toilet? N  In the past six months, have you accidently leaked urine? N  Do you have problems with loss of bowel control? N  Managing your  Medications? N  Managing your Finances? N  Housekeeping or managing your Housekeeping? N    Patient Care Team: Raliegh Ip, DO as PCP - General (Family Medicine) Danella Maiers, [facility] (Pharmacist) Sharyn Lull, Elvin So, MD as Referring Physician (Dermatology) Michaelle Copas, MD as Referring Physician (Optometry) Rinaldo Cloud, MD as Consulting Physician (Cardiology) Elmon Else, MD as Consulting Physician (Dermatology)  Indicate any recent Medical Services you may have received from other than [facility] providers in the past year (date may be approximate).     Assessment:   This is a routine wellness examination for Advance.  Hearing/Vision screen Vision Screening - Comments:: Wears rx glasses - up to date with routine eye exams with  Dr.Le   Dietary issues and exercise activities discussed:     Goals Addressed             This Visit's Progress    Exercise 3x per week (30 min per time)         Depression Screen    02/02/2023    3:40 PM 11/15/2021   11:17 AM 11/09/2021    2:09 PM 06/15/2021    2:39 PM 03/16/2021    2:03 PM 12/14/2020    3:07 PM 10/18/2020    4:19 PM  PHQ 2/9 Scores  PHQ - 2 Score 0 0 1 0 0 0 0  PHQ- 9 Score  2 3  0 1     Fall Risk    02/02/2023    3:37 PM 11/15/2021   11:17 AM 11/09/2021    2:09 PM 06/15/2021    2:39 PM 03/16/2021    2:03 PM  Fall Risk   Falls in the past year? 0 0 0 0 0  Number falls in past yr: 0      Injury with Fall? 0      Risk for fall due to : No Fall Risks      Follow up Falls prevention discussed Falls evaluation completed       MEDICARE RISK AT HOME: Medicare Risk at Home  Any stairs in or around the home?: No If so, are there any without handrails?: No Home free of loose throw rugs in walkways, pet beds, electrical cords, etc?: Yes Adequate lighting in your home to reduce risk of falls?: Yes Life alert?: No Use of a cane, walker or w/c?: Yes Grab bars in the bathroom?: Yes Shower chair or bench in shower?:  Yes Elevated toilet seat or a handicapped toilet?: Yes  TIMED UP AND GO:  Was the test performed?  No    Cognitive Function:        02/02/2023    3:41 PM 10/18/2020    4:23 PM  6CIT Screen  What Year? 0 points 0 points  What month? 0 points 0 points  What time? 0 points 0 points  Count back from 20 0 points 0 points  Months in reverse 0 points 0 points  Repeat phrase 0 points 0 points  Total Score 0 points 0 points    Immunizations Immunization History  Administered Date(s) Administered   Fluad Quad(high Dose 65+) 02/28/2020, 03/16/2021   Influenza, Seasonal, Injecte, Preservative Fre 02/18/2014   Influenza,inj,Quad PF,6+ Mos 02/22/2017, 03/22/2018, 02/18/2019   Moderna Sars-Covid-2 Vaccination 06/21/2019, 07/22/2019, 04/07/2020   Pneumococcal Conjugate-13 03/15/2020   Tdap 02/10/2011    TDAP status: Due, Education has been provided regarding the importance of this vaccine. Advised may receive this vaccine at local pharmacy or Health Dept. Aware to provide a copy of the vaccination record if obtained from local pharmacy or Health Dept. Verbalized acceptance and understanding.  Flu Vaccine status: Due, Education has been provided regarding the importance of this vaccine. Advised may receive this vaccine at local pharmacy or Health Dept. Aware to provide a copy of the vaccination record if obtained from local pharmacy or Health Dept. Verbalized acceptance and understanding.  Pneumococcal vaccine status: Up to date  Covid-19 vaccine status: Completed vaccines  Qualifies for Shingles Vaccine? Yes   Zostavax completed No   Shingrix Completed?: No.    Education has been provided regarding the importance of this vaccine. Patient has been advised to call insurance company to determine out of pocket expense if they have not yet received this vaccine. Advised may also receive vaccine at local pharmacy or Health Dept. Verbalized acceptance and understanding.  Screening Tests Health  Maintenance  Topic Date Due   Zoster Vaccines- Shingrix (1 of 2) Never done   MAMMOGRAM  01/17/2020   DTaP/Tdap/Td (2 - Td or Tdap) 02/09/2021   Pneumonia Vaccine 69+ Years old (2 of 2 - PPSV23 or PCV20) 03/15/2021   COVID-19 Vaccine (4 - 2023-24 season) 02/03/2022   DEXA SCAN  03/15/2022   INFLUENZA VACCINE  01/04/2023   Medicare Annual Wellness (AWV)  02/02/2024   Hepatitis C Screening  Completed   HPV VACCINES  Aged Out   Colonoscopy  Discontinued    Health Maintenance  Health Maintenance Due  Topic Date Due   Zoster Vaccines- Shingrix (1 of 2) Never done   MAMMOGRAM  01/17/2020   DTaP/Tdap/Td (2 - Td or Tdap) 02/09/2021   Pneumonia Vaccine 67+ Years old (2 of 2 - PPSV23 or PCV20) 03/15/2021   COVID-19 Vaccine (4 - 2023-24 season) 02/03/2022   DEXA SCAN  03/15/2022   INFLUENZA VACCINE  01/04/2023    Colorectal cancer screening: Referral to GI placed declined . Pt aware the office will call re: appt.  Mammogram status: Ordered declined . Pt provided with contact info and advised to call to schedule appt.  Bone Density status: Ordered declined . Pt provided with contact info and advised to call to schedule appt.  Lung Cancer Screening: (Low Dose CT Chest recommended if Age 38-80 years, 20 pack-year currently smoking OR have quit w/in 15years.) does not qualify.   Lung Cancer Screening Referral: n/a  Additional Screening:  Hepatitis C Screening: does not qualify; Completed 04/04/2016  Vision Screening: Recommended annual ophthalmology exams for early detection of glaucoma and other disorders of the eye. Is the patient up to date with their annual eye exam?  Yes  Who is the provider or what is the name of the office in which the patient attends annual eye exams? Dr.Le  If pt is not established with a provider, would they like to be referred to a provider to establish care? No .   Dental Screening: Recommended annual dental exams for proper oral hygiene   Community  Resource Referral / Chronic Care Management: CRR required this visit?  No   CCM required this visit?  No     Plan:     I have personally reviewed and noted the following in the patient's chart:   Medical and social history Use of alcohol, tobacco or illicit drugs  Current medications and supplements including opioid prescriptions. Patient is not currently taking opioid prescriptions. Functional ability and status Nutritional status Physical activity Advanced directives List of other physicians Hospitalizations, surgeries, and ER visits in previous 12 months Vitals Screenings to include cognitive, depression, and falls Referrals and appointments  In addition, I have reviewed and discussed with patient certain preventive protocols, quality metrics, and best practice recommendations. A written personalized care plan for preventive services as well as general preventive health recommendations were provided to patient.     Lorrene Reid, LPN   [id number]/82/[id number]   After Visit Summary: (MyChart) Due to this being a telephonic visit, the after visit summary with patients personalized plan was offered to patient via MyChart   Nurse Notes: Patient Declines all health malignance

## 2023-02-02 NOTE — Patient Instructions (Signed)
Ms. Faulconer , Thank you for taking time to come for your Medicare Wellness Visit. I appreciate your ongoing commitment to your health goals. Please review the following plan we discussed and let me know if I can assist you in the future.   Referrals/Orders/Follow-Ups/Clinician Recommendations: Aim for 30 minutes of exercise or brisk walking, 6-8 glasses of water, and 5 servings of fruits and vegetables each day.   This is a list of the screening recommended for you and due dates:  Health Maintenance  Topic Date Due   Zoster (Shingles) Vaccine (1 of 2) Never done   Mammogram  01/17/2020   DTaP/Tdap/Td vaccine (2 - Td or Tdap) 02/09/2021   Pneumonia Vaccine (2 of 2 - PPSV23 or PCV20) 03/15/2021   COVID-19 Vaccine (4 - 2023-24 season) 02/03/2022   DEXA scan (bone density measurement)  03/15/2022   Flu Shot  01/04/2023   Medicare Annual Wellness Visit  02/02/2024   Hepatitis C Screening  Completed   HPV Vaccine  Aged Out   Colon Cancer Screening  Discontinued    Advanced directives: (Provided) Advance directive discussed with you today. I have provided a copy for you to complete at home and have notarized. Once this is complete, please bring a copy in to our office so we can scan it into your chart. Information on Advanced Care Planning can be found at [facility] Advance Health Care Directives Advance Health Care Directives ([email or web address]    Next Medicare Annual Wellness Visit scheduled for next year: Yes  Insert Preventive Care attachment Insert FALL PREVENTION attachment if needed

## 2023-02-23 DIAGNOSIS — I10 Essential (primary) hypertension: Secondary | ICD-10-CM | POA: Diagnosis not present

## 2023-02-23 DIAGNOSIS — E782 Mixed hyperlipidemia: Secondary | ICD-10-CM | POA: Diagnosis not present

## 2023-02-23 DIAGNOSIS — E119 Type 2 diabetes mellitus without complications: Secondary | ICD-10-CM | POA: Diagnosis not present

## 2023-02-23 DIAGNOSIS — M150 Primary generalized (osteo)arthritis: Secondary | ICD-10-CM | POA: Diagnosis not present

## 2023-04-02 DIAGNOSIS — Z85828 Personal history of other malignant neoplasm of skin: Secondary | ICD-10-CM | POA: Diagnosis not present

## 2023-04-02 DIAGNOSIS — L570 Actinic keratosis: Secondary | ICD-10-CM | POA: Diagnosis not present

## 2023-04-02 DIAGNOSIS — D485 Neoplasm of uncertain behavior of skin: Secondary | ICD-10-CM | POA: Diagnosis not present

## 2023-04-02 DIAGNOSIS — C44722 Squamous cell carcinoma of skin of right lower limb, including hip: Secondary | ICD-10-CM | POA: Diagnosis not present

## 2023-04-02 DIAGNOSIS — C44519 Basal cell carcinoma of skin of other part of trunk: Secondary | ICD-10-CM | POA: Diagnosis not present

## 2023-04-02 DIAGNOSIS — L578 Other skin changes due to chronic exposure to nonionizing radiation: Secondary | ICD-10-CM | POA: Diagnosis not present

## 2023-04-02 DIAGNOSIS — L814 Other melanin hyperpigmentation: Secondary | ICD-10-CM | POA: Diagnosis not present

## 2023-04-02 DIAGNOSIS — C44521 Squamous cell carcinoma of skin of breast: Secondary | ICD-10-CM | POA: Diagnosis not present

## 2023-04-02 DIAGNOSIS — C44509 Unspecified malignant neoplasm of skin of other part of trunk: Secondary | ICD-10-CM | POA: Diagnosis not present

## 2023-04-20 DIAGNOSIS — I10 Essential (primary) hypertension: Secondary | ICD-10-CM | POA: Diagnosis not present

## 2023-04-20 DIAGNOSIS — R002 Palpitations: Secondary | ICD-10-CM | POA: Diagnosis not present

## 2023-04-20 DIAGNOSIS — E119 Type 2 diabetes mellitus without complications: Secondary | ICD-10-CM | POA: Diagnosis not present

## 2023-04-20 DIAGNOSIS — E782 Mixed hyperlipidemia: Secondary | ICD-10-CM | POA: Diagnosis not present

## 2023-04-22 DIAGNOSIS — Z0100 Encounter for examination of eyes and vision without abnormal findings: Secondary | ICD-10-CM | POA: Diagnosis not present

## 2023-04-24 ENCOUNTER — Encounter: Payer: Self-pay | Admitting: *Deleted

## 2023-05-21 DIAGNOSIS — C44521 Squamous cell carcinoma of skin of breast: Secondary | ICD-10-CM | POA: Diagnosis not present

## 2023-05-23 ENCOUNTER — Ambulatory Visit (INDEPENDENT_AMBULATORY_CARE_PROVIDER_SITE_OTHER): Payer: Medicare HMO | Admitting: Family Medicine

## 2023-05-23 ENCOUNTER — Encounter: Payer: Self-pay | Admitting: Family Medicine

## 2023-05-23 ENCOUNTER — Ambulatory Visit: Payer: Medicare HMO | Admitting: Family Medicine

## 2023-05-23 VITALS — BP 127/81 | HR 88 | Temp 98.6°F | Ht 65.0 in | Wt 171.8 lb

## 2023-05-23 DIAGNOSIS — M792 Neuralgia and neuritis, unspecified: Secondary | ICD-10-CM | POA: Diagnosis not present

## 2023-05-23 DIAGNOSIS — E1169 Type 2 diabetes mellitus with other specified complication: Secondary | ICD-10-CM | POA: Diagnosis not present

## 2023-05-23 DIAGNOSIS — I25118 Atherosclerotic heart disease of native coronary artery with other forms of angina pectoris: Secondary | ICD-10-CM

## 2023-05-23 DIAGNOSIS — Z7984 Long term (current) use of oral hypoglycemic drugs: Secondary | ICD-10-CM

## 2023-05-23 DIAGNOSIS — E1159 Type 2 diabetes mellitus with other circulatory complications: Secondary | ICD-10-CM

## 2023-05-23 DIAGNOSIS — E119 Type 2 diabetes mellitus without complications: Secondary | ICD-10-CM | POA: Diagnosis not present

## 2023-05-23 DIAGNOSIS — F5101 Primary insomnia: Secondary | ICD-10-CM

## 2023-05-23 DIAGNOSIS — E785 Hyperlipidemia, unspecified: Secondary | ICD-10-CM | POA: Diagnosis not present

## 2023-05-23 DIAGNOSIS — I152 Hypertension secondary to endocrine disorders: Secondary | ICD-10-CM

## 2023-05-23 DIAGNOSIS — M4727 Other spondylosis with radiculopathy, lumbosacral region: Secondary | ICD-10-CM | POA: Diagnosis not present

## 2023-05-23 LAB — BAYER DCA HB A1C WAIVED: HB A1C (BAYER DCA - WAIVED): 6.9 % — ABNORMAL HIGH (ref 4.8–5.6)

## 2023-05-23 MED ORDER — TRAZODONE HCL 100 MG PO TABS
50.0000 mg | ORAL_TABLET | Freq: Every evening | ORAL | 0 refills | Status: DC | PRN
Start: 2023-05-23 — End: 2023-08-21

## 2023-05-23 MED ORDER — ALPHA-LIPOIC ACID 600 MG PO CAPS: 1.0000 | ORAL_CAPSULE | Freq: Every day | ORAL | 3 refills | Status: DC

## 2023-05-23 MED ORDER — METHYLPREDNISOLONE ACETATE 40 MG/ML IJ SUSP
40.0000 mg | Freq: Once | INTRAMUSCULAR | Status: AC
Start: 2023-05-23 — End: 2023-05-23
  Administered 2023-05-23: 40 mg via INTRAMUSCULAR

## 2023-05-23 MED ORDER — OZEMPIC (0.25 OR 0.5 MG/DOSE) 2 MG/3ML ~~LOC~~ SOPN
PEN_INJECTOR | SUBCUTANEOUS | 3 refills | Status: AC
Start: 2023-05-23 — End: 2024-06-19

## 2023-05-23 NOTE — Patient Instructions (Addendum)
REDUCE Meformin to just ONCE per day START Ozempic at 0.25mg  injected into the skin once every 7 days x1 month THEN increase to 0.5mg  injected into the skin once every 7 days thereafter  Tips for success with Ozempic (and by success, how not to be super sick on your stomach): Eat small meals AVOID heavy foods (fried/ high in carbs like bread, pasta, rice) AVOID carbonated beverages (soda/ beer, as these can increase bloating) DOUBLE your water intake (will help you avoid constipation/ dehydration)  Ozempic CAN cause: Nausea Abdominal pain Increased acid reflux (sometimes presents as "sour burps") Constipation OR Diarrhea Fatigue (especially when you first start it)

## 2023-05-23 NOTE — Progress Notes (Signed)
Subjective: CC:DM PCP: Raliegh Ip, DO EAV:WUJWJ B Galm is a 67 y.o. female presenting to clinic today for:  1. Type 2 Diabetes with hypertension, hyperlipidemia associated with CAD:  New onset diagnosis roughly 6 months ago.  Patient is been lost to follow-up for almost 2 years from this clinic.  She had previously been prediabetic for quite some time.  Since that diagnosis she has really been cutting back on carbohydrates, sugar and subsequently has lost about 17 pounds.  She was placed on metformin 500 mg twice daily and she notes that she has had upset GI system ever since.  She cannot even leave the house sometimes without having to have diarrheal symptoms.  She does not report any blood in stool.  She is compliant with her Crestor and newly started losartan-hydrochlorothiazide.  Diabetes Health Maintenance Due  Topic Date Due   FOOT EXAM  Never done   HEMOGLOBIN A1C  09/14/2021   OPHTHALMOLOGY EXAM  05/23/2023 (Originally 11/19/1965)    Last A1c:  Lab Results  Component Value Date   HGBA1C 6.0 (H) 03/16/2021    ROS: No chest pain, shortness of breath reported.  She has chronic numbness and tingling in legs left greater than right.  Has known radiculopathy of the lumbar spine.  She is in pain in her right hip today  2.  Insomnia Patient with longstanding history of insomnia.  She was previously on Ambien but unfortunately had an inappropriate drug screen a few years ago and so we transitioned her over to prazosin.  She notes prazosin was ineffective.  She has tried multiple medications OTC which have been ineffective.  She was on amitriptyline   ROS: Per HPI  Allergies  Allergen Reactions   Gabapentin     Dizziness and blurred vision   Past Medical History:  Diagnosis Date   Anxiety    Arthritis    GERD (gastroesophageal reflux disease)    Hyperlipidemia    Hypertension     Current Outpatient Medications:    aspirin 81 MG chewable tablet, Chew 81 mg by  mouth daily., Disp: , Rfl:    atenolol (TENORMIN) 25 MG tablet, Take 25 mg by mouth daily. , Disp: , Rfl: 2   cholecalciferol (VITAMIN D3) 25 MCG (1000 UT) tablet, Take 1,000 Units by mouth daily., Disp: , Rfl:    Continuous Glucose Receiver (FREESTYLE LIBRE 3 READER) DEVI, See admin instructions., Disp: , Rfl:    isosorbide mononitrate (IMDUR) 60 MG 24 hr tablet, Take 60 mg by mouth daily., Disp: , Rfl:    metFORMIN (GLUCOPHAGE) 500 MG tablet, Take 500 mg by mouth 2 (two) times daily., Disp: , Rfl:    nitroGLYCERIN (NITROSTAT) 0.4 MG SL tablet, See admin instructions., Disp: , Rfl:    nystatin (MYCOSTATIN/NYSTOP) powder, Apply 1 application. topically 3 (three) times daily., Disp: 60 g, Rfl: 0   omeprazole (PRILOSEC) 20 MG capsule, TAKE 1 CAPSULE BY MOUTH EVERY DAY FOR 2 TO 4 WEEKS PER FLARE (NEEDS TO BE SEEN BEFORE NEXT REFILL), Disp: 30 capsule, Rfl: 0   rosuvastatin (CRESTOR) 5 MG tablet, Take 5 mg by mouth daily., Disp: , Rfl:    vitamin C (ASCORBIC ACID) 500 MG tablet, Take 500 mg by mouth daily., Disp: , Rfl:  Social History   Socioeconomic History   Marital status: Married    Spouse name: Not on file   Number of children: 2   Years of education: Not on file   Highest education level: Not  on file  Occupational History   Occupation: disability  Tobacco Use   Smoking status: Never   Smokeless tobacco: Never  Vaping Use   Vaping status: Never Used  Substance and Sexual Activity   Alcohol use: No   Drug use: No   Sexual activity: Not on file  Other Topics Concern   Not on file  Social History Narrative   Lives with her husband. Children and grandchildren live within 10 miles   Social Drivers of Health   Financial Resource Strain: Low Risk  (02/02/2023)   Overall Financial Resource Strain (CARDIA)    Difficulty of Paying Living Expenses: Not hard at all  Food Insecurity: No Food Insecurity (02/02/2023)   Hunger Vital Sign    Worried About Running Out of Food in the Last  Year: Never true    Ran Out of Food in the Last Year: Never true  Transportation Needs: No Transportation Needs (02/02/2023)   PRAPARE - Administrator, Civil Service (Medical): No    Lack of Transportation (Non-Medical): No  Physical Activity: Inactive (02/02/2023)   Exercise Vital Sign    Days of Exercise per Week: 0 days    Minutes of Exercise per Session: 0 min  Stress: No Stress Concern Present (02/02/2023)   [employer] of Occupational Health - Occupational Stress Questionnaire    Feeling of Stress : Not at all  Social Connections: Moderately Isolated (02/02/2023)   Social Connection and Isolation Panel [NHANES]    Frequency of Communication with Friends and Family: More than three times a week    Frequency of Social Gatherings with Friends and Family: More than three times a week    Attends Religious Services: Never    Database administrator or Organizations: No    Attends Banker Meetings: Never    Marital Status: Married  Catering manager Violence: Not At Risk (02/02/2023)   Humiliation, Afraid, Rape, and Kick questionnaire    Fear of Current or Ex-Partner: No    Emotionally Abused: No    Physically Abused: No    Sexually Abused: No   Family History  Problem Relation Age of Onset   Congestive Heart Failure Mother    Arthritis Mother    Cancer Father        lung   Arthritis/Rheumatoid Maternal Grandmother    Cancer Paternal Grandfather        skin    Objective: Office vital signs reviewed. BP 127/81   Pulse 88   Temp 98.6 F (37 C)   Ht 5\' 5"  (1.651 m)   Wt 171 lb 12.8 oz (77.9 kg)   SpO2 96%   BMI 28.59 kg/m   Physical Examination:  General: Awake, alert, well nourished, No acute distress HEENT: sclera white, MMM Cardio: regular rate and rhythm, S1S2 heard, no murmurs appreciated Pulm: clear to auscultation bilaterally, no wheezes, rhonchi or rales; normal work of breathing on room air MSK: Antalgic gait and station  Diabetic  Foot Exam - Simple   Simple Foot Form Diabetic Foot exam was performed with the following findings: Yes 05/23/2023 10:49 AM  Visual Inspection See comments: Yes Sensation Testing Intact to touch and monofilament testing bilaterally: Yes Pulse Check Posterior Tibialis and Dorsalis pulse intact bilaterally: Yes Comments Onychomycotic changes to the nails bilaterally.  Several missing toenails.      Assessment/ Plan: 67 y.o. female   Diabetes mellitus treated with oral medication (HCC) - Plan: Microalbumin / creatinine urine ratio, Bayer DCA  Hb A1c Waived, Semaglutide,0.25 or 0.5MG /DOS, (OZEMPIC, 0.25 OR 0.5 MG/DOSE,) 2 MG/3ML SOPN  Hyperlipidemia associated with type 2 diabetes mellitus (HCC) - Plan: CMP14+EGFR, Lipid Panel, Semaglutide,0.25 or 0.5MG /DOS, (OZEMPIC, 0.25 OR 0.5 MG/DOSE,) 2 MG/3ML SOPN  Hypertension associated with diabetes (HCC) - Plan: CMP14+EGFR, Lipid Panel, Semaglutide,0.25 or 0.5MG /DOS, (OZEMPIC, 0.25 OR 0.5 MG/DOSE,) 2 MG/3ML SOPN  Primary insomnia - Plan: traZODone (DESYREL) 100 MG tablet  Osteoarthritis of spine with radiculopathy, lumbosacral region - Plan: methylPREDNISolone acetate (DEPO-MEDROL) injection 40 mg  Coronary artery disease of native artery of native heart with stable angina pectoris (HCC) - Plan: Semaglutide,0.25 or 0.5MG /DOS, (OZEMPIC, 0.25 OR 0.5 MG/DOSE,) 2 MG/3ML SOPN  Peripheral neuralgia - Plan: Alpha-Lipoic Acid 600 MG CAPS   We discussed options including going to an extended release version of metformin versus Januvia versus trialing Ozempic which I think would be actually really good option for her given known CAD.  We discussed that this can reduce cardiovascular events by over 20%.  But she can reduce to 1 tablet of metformin 500 mg daily as she titrates onto 0.25 mg weekly of Ozempic.  After 1 month she may go up to 0.5 mg weekly and then can discontinue metformin totally.  I will see her back in 3 months for A1c recheck.  Of note  her blood sugar was under control with A1c of 6.9 today but ideally would like her to have an A1c closer to 6.5 given CAD  Lipid panel collected today.  Continue statin therapy.  Blood pressure controlled with current regimen as outlined by her cardiologist.  I have ordered trazodone for sleep.  Other options include doxepin or ramelteon for this patient.  I have given her a dose of Depo-Medrol intramuscularly for her joint issues.  I have prescribed alpha lipoic acid for peripheral neuralgia that is likely multifactorial  Raliegh Ip, DO [facility] [phone]

## 2023-05-24 LAB — CMP14+EGFR
ALT: 13 [IU]/L (ref 0–32)
AST: 19 [IU]/L (ref 0–40)
Albumin: 4.5 g/dL (ref 3.9–4.9)
Alkaline Phosphatase: 93 [IU]/L (ref 44–121)
BUN/Creatinine Ratio: 10 — ABNORMAL LOW (ref 12–28)
BUN: 9 mg/dL (ref 8–27)
Bilirubin Total: 0.5 mg/dL (ref 0.0–1.2)
CO2: 25 mmol/L (ref 20–29)
Calcium: 8.6 mg/dL — ABNORMAL LOW (ref 8.7–10.3)
Chloride: 96 mmol/L (ref 96–106)
Creatinine, Ser: 0.9 mg/dL (ref 0.57–1.00)
Globulin, Total: 2.3 g/dL (ref 1.5–4.5)
Glucose: 139 mg/dL — ABNORMAL HIGH (ref 70–99)
Potassium: 3.3 mmol/L — ABNORMAL LOW (ref 3.5–5.2)
Sodium: 140 mmol/L (ref 134–144)
Total Protein: 6.8 g/dL (ref 6.0–8.5)
eGFR: 70 mL/min/{1.73_m2} (ref >59)

## 2023-05-24 LAB — LIPID PANEL
Chol/HDL Ratio: 2.1 {ratio} (ref 0.0–4.4)
Cholesterol, Total: 131 mg/dL (ref 100–199)
HDL: 62 mg/dL (ref >39)
LDL Chol Calc (NIH): 50 mg/dL (ref 0–99)
Triglycerides: 106 mg/dL (ref 0–149)
VLDL Cholesterol Cal: 19 mg/dL (ref 5–40)

## 2023-05-25 ENCOUNTER — Other Ambulatory Visit: Payer: Self-pay | Admitting: Family Medicine

## 2023-05-25 DIAGNOSIS — E876 Hypokalemia: Secondary | ICD-10-CM

## 2023-05-25 MED ORDER — POTASSIUM CHLORIDE CRYS ER 20 MEQ PO TBCR
20.0000 meq | EXTENDED_RELEASE_TABLET | Freq: Two times a day (BID) | ORAL | 0 refills | Status: DC
Start: 2023-05-25 — End: 2024-02-25

## 2023-06-04 DIAGNOSIS — C44629 Squamous cell carcinoma of skin of left upper limb, including shoulder: Secondary | ICD-10-CM | POA: Diagnosis not present

## 2023-06-15 ENCOUNTER — Ambulatory Visit: Payer: Medicare HMO | Admitting: Family Medicine

## 2023-06-15 ENCOUNTER — Encounter: Payer: Self-pay | Admitting: Family Medicine

## 2023-06-15 ENCOUNTER — Telehealth (INDEPENDENT_AMBULATORY_CARE_PROVIDER_SITE_OTHER): Payer: Medicare HMO | Admitting: Family Medicine

## 2023-06-15 ENCOUNTER — Telehealth: Payer: Medicare HMO | Admitting: Family Medicine

## 2023-06-15 DIAGNOSIS — U071 COVID-19: Secondary | ICD-10-CM | POA: Diagnosis not present

## 2023-06-15 MED ORDER — NIRMATRELVIR/RITONAVIR (PAXLOVID)TABLET: 3.0000 | ORAL_TABLET | Freq: Two times a day (BID) | ORAL | 0 refills | Status: AC

## 2023-06-15 NOTE — Progress Notes (Signed)
 Virtual Visit via MyChart video note  I connected with [name] on 06/15/23 at 1037 by video and verified that I am speaking with the correct person using two identifiers. [name] is currently located at home and patient are currently with her during visit. The provider, Fonda LABOR Malissie Musgrave, MD is located in their office at time of visit.  Call ended at 1044  I discussed the limitations, risks, security and privacy concerns of performing an evaluation and management service by video and the availability of in person appointments. I also discussed with the patient that there may be a patient responsible charge related to this service. The patient expressed understanding and agreed to proceed.   History and Present Illness: Patient is calling in for positive covid test.  Her husband has been ill with it last week.  She is having coughing and sneezing and runny nose and congestion and fevers and aches.  She started with symptoms yesterday afternoon. She was vaccinated for the 1st 2 covid vaccines. She is used tylenol  for fevers and is using mucinex.  Her fever was just over 100. She denies SOB or wheezing.   1. COVID-19 virus infection     Outpatient Encounter Medications as of 06/15/2023  Medication Sig   nirmatrelvir /ritonavir  (PAXLOVID ) 20 x 150 MG & 10 x 100MG  TABS Take 3 tablets by mouth 2 (two) times daily for 5 days. (Take nirmatrelvir  150 mg two tablets twice daily for 5 days and ritonavir  100 mg one tablet twice daily for 5 days) Patient GFR is >60   Alpha-Lipoic Acid 600 MG CAPS Take 1 capsule (600 mg total) by mouth daily. For neuropathy   aspirin  81 MG chewable tablet Chew 81 mg by mouth daily.   atenolol  (TENORMIN ) 25 MG tablet Take 25 mg by mouth daily.    cholecalciferol (VITAMIN D3) 25 MCG (1000 UT) tablet Take 1,000 Units by mouth daily.   Continuous Glucose Receiver (FREESTYLE LIBRE 3 READER) DEVI See admin instructions.   isosorbide mononitrate  (IMDUR ) 60 MG 24 hr  tablet Take 60 mg by mouth daily.   losartan-hydrochlorothiazide  (HYZAAR) 100-12.5 MG tablet Take 1 tablet by mouth daily.   metFORMIN (GLUCOPHAGE) 500 MG tablet Take 500 mg by mouth 2 (two) times daily.   nitroGLYCERIN  (NITROSTAT ) 0.4 MG SL tablet See admin instructions.   nystatin  (MYCOSTATIN /NYSTOP ) powder Apply 1 application. topically 3 (three) times daily.   omeprazole  (PRILOSEC) 20 MG capsule TAKE 1 CAPSULE BY MOUTH EVERY DAY FOR 2 TO 4 WEEKS PER FLARE (NEEDS TO BE SEEN BEFORE NEXT REFILL)   potassium chloride  SA (KLOR-CON  M) 20 MEQ tablet Take 1 tablet (20 mEq total) by mouth 2 (two) times daily for 3 days.   rosuvastatin (CRESTOR) 5 MG tablet Take 5 mg by mouth daily.   Semaglutide ,0.25 or 0.5MG /DOS, (OZEMPIC , 0.25 OR 0.5 MG/DOSE,) 2 MG/3ML SOPN Inject 0.25 mg into the skin every 7 (seven) days for 28 days, THEN 0.5 mg every 7 (seven) days.   traZODone  (DESYREL ) 100 MG tablet Take 0.5-1 tablets (50-100 mg total) by mouth at bedtime as needed for sleep.   vitamin C (ASCORBIC ACID) 500 MG tablet Take 500 mg by mouth daily.   No facility-administered encounter medications on file as of 06/15/2023.    Review of Systems  Constitutional:  Positive for chills and fever.  HENT:  Positive for congestion, postnasal drip, rhinorrhea, sinus pressure and sneezing. Negative for ear discharge, ear pain and sore throat.   Eyes:  Negative  for pain, redness and visual disturbance.  Respiratory:  Positive for cough. Negative for chest tightness, shortness of breath and wheezing.   Cardiovascular:  Negative for chest pain and leg swelling.  Genitourinary:  Negative for difficulty urinating and dysuria.  Musculoskeletal:  Positive for myalgias. Negative for back pain and gait problem.  Skin:  Negative for rash.  Neurological:  Positive for headaches. Negative for dizziness and light-headedness.  Psychiatric/Behavioral:  Negative for agitation and behavioral problems.   All other systems reviewed and are  negative.   Observations/Objective: Patient sounds looks comfortable and in no acute distress  Assessment and Plan: Problem List Items Addressed This Visit   None Visit Diagnoses       COVID-19 virus infection    -  Primary   Relevant Medications   nirmatrelvir /ritonavir  (PAXLOVID ) 20 x 150 MG & 10 x 100MG  TABS       Patient tested positive at home for COVID and has had family recently tested positive last week.  Will treat with Paxil bid and encouraged her to continue taking Tylenol  and Mucinex Follow up plan: Return if symptoms worsen or fail to improve.     I discussed the assessment and treatment plan with the patient. The patient was provided an opportunity to ask questions and all were answered. The patient agreed with the plan and demonstrated an understanding of the instructions.   The patient was advised to call back or seek an in-person evaluation if the symptoms worsen or if the condition fails to improve as anticipated.  The above assessment and management plan was discussed with the patient. The patient verbalized understanding of and has agreed to the management plan. Patient is aware to call the clinic if symptoms persist or worsen. Patient is aware when to return to the clinic for a follow-up visit. Patient educated on when it is appropriate to go to the emergency department.    I provided 7 minutes of non-face-to-face time during this encounter.    Fonda DELENA Levins, MD

## 2023-06-25 ENCOUNTER — Ambulatory Visit: Payer: Medicare HMO | Admitting: Family Medicine

## 2023-07-20 DIAGNOSIS — I10 Essential (primary) hypertension: Secondary | ICD-10-CM | POA: Diagnosis not present

## 2023-07-20 DIAGNOSIS — M150 Primary generalized (osteo)arthritis: Secondary | ICD-10-CM | POA: Diagnosis not present

## 2023-07-20 DIAGNOSIS — E782 Mixed hyperlipidemia: Secondary | ICD-10-CM | POA: Diagnosis not present

## 2023-07-20 DIAGNOSIS — E119 Type 2 diabetes mellitus without complications: Secondary | ICD-10-CM | POA: Diagnosis not present

## 2023-08-02 DIAGNOSIS — C44529 Squamous cell carcinoma of skin of other part of trunk: Secondary | ICD-10-CM | POA: Diagnosis not present

## 2023-08-16 DIAGNOSIS — C44722 Squamous cell carcinoma of skin of right lower limb, including hip: Secondary | ICD-10-CM | POA: Diagnosis not present

## 2023-08-16 DIAGNOSIS — D0471 Carcinoma in situ of skin of right lower limb, including hip: Secondary | ICD-10-CM | POA: Diagnosis not present

## 2023-08-21 ENCOUNTER — Encounter: Payer: Self-pay | Admitting: Family Medicine

## 2023-08-21 ENCOUNTER — Ambulatory Visit (INDEPENDENT_AMBULATORY_CARE_PROVIDER_SITE_OTHER): Payer: Medicare HMO | Admitting: Family Medicine

## 2023-08-21 VITALS — BP 109/70 | HR 86 | Temp 98.7°F | Ht 65.0 in | Wt 149.4 lb

## 2023-08-21 DIAGNOSIS — F5101 Primary insomnia: Secondary | ICD-10-CM | POA: Diagnosis not present

## 2023-08-21 DIAGNOSIS — Z7985 Long-term (current) use of injectable non-insulin antidiabetic drugs: Secondary | ICD-10-CM | POA: Diagnosis not present

## 2023-08-21 DIAGNOSIS — E785 Hyperlipidemia, unspecified: Secondary | ICD-10-CM

## 2023-08-21 DIAGNOSIS — E1159 Type 2 diabetes mellitus with other circulatory complications: Secondary | ICD-10-CM | POA: Diagnosis not present

## 2023-08-21 DIAGNOSIS — Z780 Asymptomatic menopausal state: Secondary | ICD-10-CM

## 2023-08-21 DIAGNOSIS — I25118 Atherosclerotic heart disease of native coronary artery with other forms of angina pectoris: Secondary | ICD-10-CM

## 2023-08-21 DIAGNOSIS — E119 Type 2 diabetes mellitus without complications: Secondary | ICD-10-CM

## 2023-08-21 DIAGNOSIS — I152 Hypertension secondary to endocrine disorders: Secondary | ICD-10-CM

## 2023-08-21 DIAGNOSIS — K219 Gastro-esophageal reflux disease without esophagitis: Secondary | ICD-10-CM

## 2023-08-21 DIAGNOSIS — E1169 Type 2 diabetes mellitus with other specified complication: Secondary | ICD-10-CM | POA: Diagnosis not present

## 2023-08-21 LAB — BAYER DCA HB A1C WAIVED: HB A1C (BAYER DCA - WAIVED): 5.3 % (ref 4.8–5.6)

## 2023-08-21 MED ORDER — OMEPRAZOLE 20 MG PO CPDR: DELAYED_RELEASE_CAPSULE | ORAL | 3 refills | Status: DC

## 2023-08-21 MED ORDER — TRAZODONE HCL 100 MG PO TABS: 50.0000 mg | ORAL_TABLET | Freq: Every evening | ORAL | 3 refills | Status: AC | PRN

## 2023-08-21 NOTE — Progress Notes (Signed)
 Subjective: CC:DM PCP: Raliegh Ip, DO ZDG:LOVFI B Whittier is a 68 y.o. female presenting to clinic today for:  1. Type 2 Diabetes with hypertension, hyperlipidemia associated with CAD:  At last visit we reduced her metformin and started her on Ozempic.  She was continued on Crestor and losartan-hydrochlorothiazide.  She presents today for recheck on how the Ozempic is going.  She reports that she has been doing really well on this.  She discontinue the metformin totally and was recently taken off of the Hyzaar by her cardiologist because her blood pressures were running low.  She is currently taking 25 mg of atenolol and isosorbide 60 mg along with her Crestor at 5 mg and Ozempic 0.5 mg weekly.  She is very interested in patient assistance program because this medications and across her several hundred dollars but she really wants to stay on it because she overall feels better. Diabetes Health Maintenance Due  Topic Date Due   OPHTHALMOLOGY EXAM  Never done   HEMOGLOBIN A1C  11/21/2023   FOOT EXAM  05/22/2024    Last A1c:  Lab Results  Component Value Date   HGBA1C 6.9 (H) 05/23/2023    ROS: No chest pain, shortness of breath, abdominal pain, nausea vomiting  ROS: Per HPI  Allergies  Allergen Reactions   Gabapentin     Dizziness and blurred vision   Past Medical History:  Diagnosis Date   Anxiety    Arthritis    GERD (gastroesophageal reflux disease)    Hyperlipidemia    Hypertension     Current Outpatient Medications:    Alpha-Lipoic Acid 600 MG CAPS, Take 1 capsule (600 mg total) by mouth daily. For neuropathy, Disp: 100 capsule, Rfl: 3   aspirin 81 MG chewable tablet, Chew 81 mg by mouth daily., Disp: , Rfl:    atenolol (TENORMIN) 25 MG tablet, Take 25 mg by mouth daily. , Disp: , Rfl: 2   cholecalciferol (VITAMIN D3) 25 MCG (1000 UT) tablet, Take 1,000 Units by mouth daily., Disp: , Rfl:    Continuous Glucose Receiver (FREESTYLE LIBRE 3 READER) DEVI, See  admin instructions., Disp: , Rfl:    isosorbide mononitrate (IMDUR) 60 MG 24 hr tablet, Take 60 mg by mouth daily., Disp: , Rfl:    losartan-hydrochlorothiazide (HYZAAR) 100-12.5 MG tablet, Take 1 tablet by mouth daily., Disp: , Rfl:    metFORMIN (GLUCOPHAGE) 500 MG tablet, Take 500 mg by mouth 2 (two) times daily., Disp: , Rfl:    nitroGLYCERIN (NITROSTAT) 0.4 MG SL tablet, See admin instructions., Disp: , Rfl:    nystatin (MYCOSTATIN/NYSTOP) powder, Apply 1 application. topically 3 (three) times daily., Disp: 60 g, Rfl: 0   omeprazole (PRILOSEC) 20 MG capsule, TAKE 1 CAPSULE BY MOUTH EVERY DAY FOR 2 TO 4 WEEKS PER FLARE (NEEDS TO BE SEEN BEFORE NEXT REFILL), Disp: 30 capsule, Rfl: 0   potassium chloride SA (KLOR-CON M) 20 MEQ tablet, Take 1 tablet (20 mEq total) by mouth 2 (two) times daily for 3 days., Disp: 6 tablet, Rfl: 0   rosuvastatin (CRESTOR) 5 MG tablet, Take 5 mg by mouth daily., Disp: , Rfl:    Semaglutide,0.25 or 0.5MG /DOS, (OZEMPIC, 0.25 OR 0.5 MG/DOSE,) 2 MG/3ML SOPN, Inject 0.25 mg into the skin every 7 (seven) days for 28 days, THEN 0.5 mg every 7 (seven) days., Disp: 9 mL, Rfl: 3   traZODone (DESYREL) 100 MG tablet, Take 0.5-1 tablets (50-100 mg total) by mouth at bedtime as needed for sleep., Disp:  90 tablet, Rfl: 0   vitamin C (ASCORBIC ACID) 500 MG tablet, Take 500 mg by mouth daily., Disp: , Rfl:  Social History   Socioeconomic History   Marital status: Married    Spouse name: Not on file   Number of children: 2   Years of education: Not on file   Highest education level: Not on file  Occupational History   Occupation: disability  Tobacco Use   Smoking status: Never   Smokeless tobacco: Never  Vaping Use   Vaping status: Never Used  Substance and Sexual Activity   Alcohol use: No   Drug use: No   Sexual activity: Not on file  Other Topics Concern   Not on file  Social History Narrative   Lives with her husband. Children and grandchildren live within 10 miles    Social Drivers of Health   Financial Resource Strain: Low Risk  (02/02/2023)   Overall Financial Resource Strain (CARDIA)    Difficulty of Paying Living Expenses: Not hard at all  Food Insecurity: No Food Insecurity (02/02/2023)   Hunger Vital Sign    Worried About Running Out of Food in the Last Year: Never true    Ran Out of Food in the Last Year: Never true  Transportation Needs: No Transportation Needs (02/02/2023)   PRAPARE - Administrator, Civil Service (Medical): No    Lack of Transportation (Non-Medical): No  Physical Activity: Inactive (02/02/2023)   Exercise Vital Sign    Days of Exercise per Week: 0 days    Minutes of Exercise per Session: 0 min  Stress: No Stress Concern Present (02/02/2023)   [employer] of Occupational Health - Occupational Stress Questionnaire    Feeling of Stress : Not at all  Social Connections: Moderately Isolated (02/02/2023)   Social Connection and Isolation Panel [NHANES]    Frequency of Communication with Friends and Family: More than three times a week    Frequency of Social Gatherings with Friends and Family: More than three times a week    Attends Religious Services: Never    Database administrator or Organizations: No    Attends Banker Meetings: Never    Marital Status: Married  Catering manager Violence: Not At Risk (02/02/2023)   Humiliation, Afraid, Rape, and Kick questionnaire    Fear of Current or Ex-Partner: No    Emotionally Abused: No    Physically Abused: No    Sexually Abused: No   Family History  Problem Relation Age of Onset   Congestive Heart Failure Mother    Arthritis Mother    Cancer Father        lung   Arthritis/Rheumatoid Maternal Grandmother    Cancer Paternal Grandfather        skin    Objective: Office vital signs reviewed. BP 109/70   Pulse 86   Temp 98.7 F (37.1 C)   Ht 5\' 5"  (1.651 m)   Wt 149 lb 6.4 oz (67.8 kg)   SpO2 96%   BMI 24.86 kg/m   Physical  Examination:  General: Awake, alert, well nourished, No acute distress HEENT: sclera white MMM Cardio: regular rate and rhythm, S1S2 heard, no murmurs appreciated Pulm: clear to auscultation bilaterally, no wheezes, rhonchi or rales; normal work of breathing on room air MSK: Ambulates with use of cane  Assessment/ Plan: 68 y.o. female   Diabetes mellitus treated with injections of non-insulin medication (HCC) - Plan: Bayer DCA Hb A1c Waived, Microalbumin /  creatinine urine ratio  Hyperlipidemia associated with type 2 diabetes mellitus (HCC)  Hypertension associated with diabetes (HCC) - Plan: Basic Metabolic Panel  Coronary artery disease of native artery of native heart with stable angina pectoris (HCC)  Gastroesophageal reflux disease without esophagitis - Plan: omeprazole (PRILOSEC) 20 MG capsule  Primary insomnia - Plan: traZODone (DESYREL) 100 MG tablet  Asymptomatic postmenopausal estrogen deficiency - Plan: DG [facility] DEXA  Sugar now under excellent control with A1c down to 5.3.  I filled out a patient assistance program form and returned back to the patient so that she may complete this and submit it or drop it here so that we can fax it for her.  Check urine microalbumin  Continue statin, Ozempic for cardiac protection.  Check renal function.  Blood pressure on the low end of normal but she is not symptomatic so she may continue the medications as prescribed by her cardiologist  PPI renewed for GERD that is likely slightly exacerbated by the GIP  Trazodone renewed.  DEXA scan ordered and she will schedule this for later date  Margarit Minshall M Lezly Rumpf, DO [facility] [phone]

## 2023-08-22 ENCOUNTER — Encounter: Payer: Self-pay | Admitting: Family Medicine

## 2023-08-22 LAB — BASIC METABOLIC PANEL
BUN/Creatinine Ratio: 12 (ref 12–28)
BUN: 10 mg/dL (ref 8–27)
CO2: 26 mmol/L (ref 20–29)
Calcium: 9.7 mg/dL (ref 8.7–10.3)
Chloride: 101 mmol/L (ref 96–106)
Creatinine, Ser: 0.81 mg/dL (ref 0.57–1.00)
Glucose: 105 mg/dL — ABNORMAL HIGH (ref 70–99)
Potassium: 4.2 mmol/L (ref 3.5–5.2)
Sodium: 140 mmol/L (ref 134–144)
eGFR: 79 mL/min/{1.73_m2} (ref >59)

## 2023-08-24 ENCOUNTER — Other Ambulatory Visit (HOSPITAL_COMMUNITY): Payer: Self-pay

## 2023-08-24 ENCOUNTER — Telehealth: Payer: Self-pay

## 2023-08-24 NOTE — Progress Notes (Signed)
 Pharmacy Medication Assistance Program Note    08/24/2023  Patient ID: [name], female   DOB: 07-16-1955, 68 y.o.   MRN: [id number]     08/24/2023  Outreach Med[employer]  Manufacturer Medication One [employer] [facility] Ozempic  Dose of Ozempic 0.5MG   Type of Radiographer, therapeutic Assistance  Date Application Received From Patient 08/24/2023  Application Items Received From Patient Application  Date Application Received From Provider 08/24/2023  Date Application Submitted to Manufacturer 08/24/2023  Method Application Sent to Manufacturer Fax     NEW PAP

## 2023-09-04 DIAGNOSIS — D0439 Carcinoma in situ of skin of other parts of face: Secondary | ICD-10-CM | POA: Diagnosis not present

## 2023-09-04 DIAGNOSIS — D485 Neoplasm of uncertain behavior of skin: Secondary | ICD-10-CM | POA: Diagnosis not present

## 2023-09-04 DIAGNOSIS — H00014 Hordeolum externum left upper eyelid: Secondary | ICD-10-CM | POA: Diagnosis not present

## 2023-09-04 DIAGNOSIS — C44529 Squamous cell carcinoma of skin of other part of trunk: Secondary | ICD-10-CM | POA: Diagnosis not present

## 2023-09-21 NOTE — Telephone Encounter (Signed)
 Refaxed pcp pgs with updated qty of 4 pens.

## 2023-09-24 DIAGNOSIS — C44329 Squamous cell carcinoma of skin of other parts of face: Secondary | ICD-10-CM | POA: Diagnosis not present

## 2023-09-27 NOTE — Progress Notes (Signed)
 Pharmacy Medication Assistance Program Note    09/27/2023  Patient ID: [name], female   DOB: 1956-04-10, 68 y.o.   MRN: [id number]     08/24/2023  Outreach Medication One  Manufacturer Medication One Novo Nordisk  Nordisk Drugs Ozempic   Dose of Ozempic  0.5MG   Type of Radiographer, therapeutic Assistance  Date Application Received From Patient 08/24/2023  Application Items Received From Patient Application  Date Application Received From Provider 08/24/2023  Date Application Submitted to Manufacturer 08/24/2023  Method Application Sent to Manufacturer Fax  Patient Assistance Determination Approved  Approval Start Date 09/25/2023  Approval End Date 06/04/2024    APPROVED - shipment should arrive in 10-14 business days to the office

## 2023-10-01 ENCOUNTER — Telehealth: Payer: Self-pay

## 2023-10-01 NOTE — Telephone Encounter (Signed)
 Copied from CRM [id number]. Topic: Clinical - Medication Question >> Oct 01, 2023  4:26 PM Everette C wrote: Reason for CRM: The patient has been notified that they have been approved for the novo nordisk patient assistance program that their prescription of Semaglutide ,0.25 or 0.5MG /DOS, (OZEMPIC , 0.25 OR 0.5 MG/DOSE,) 2 MG/3ML SOPN [[id number]] will be sent to the practice via mail within the next 6-14 business days   Please contact the patient when possible to notify them of their prescriptions arrival if/when possible

## 2023-10-08 DIAGNOSIS — C44529 Squamous cell carcinoma of skin of other part of trunk: Secondary | ICD-10-CM | POA: Diagnosis not present

## 2023-10-09 ENCOUNTER — Telehealth: Payer: Self-pay | Admitting: Pharmacist

## 2023-10-09 DIAGNOSIS — Z7985 Long-term (current) use of injectable non-insulin antidiabetic drugs: Secondary | ICD-10-CM

## 2023-10-09 MED ORDER — FREESTYLE LIBRE 3 PLUS SENSOR MISC
5 refills | Status: DC
Start: 2023-10-09 — End: 2024-04-08

## 2023-10-09 NOTE — Telephone Encounter (Signed)
   Patient enrolled in the Novo nordisk patient assistance program for Ozempic .  First shipment of Ozempic  0.25mg  weekly for 4 weeks then increase to 0.5mg  weekly until follow up has arrived at PCP.  Patient is stable on current regimen, A1c 5.3%. She is currently using Libre 3 CGM.  Will call in the libre 3 PLUS as libre 3 (plain) is being discontinued come Fall 2025.  Continue all other medications as prescribed.  Encouraged compliance with medications.  Tamani Durney Dattero Donelda Mailhot, PharmD, BCACP, CPP Clinical Pharmacist, [facility]

## 2023-10-19 DIAGNOSIS — E782 Mixed hyperlipidemia: Secondary | ICD-10-CM | POA: Diagnosis not present

## 2023-10-19 DIAGNOSIS — E119 Type 2 diabetes mellitus without complications: Secondary | ICD-10-CM | POA: Diagnosis not present

## 2023-10-19 DIAGNOSIS — M150 Primary generalized (osteo)arthritis: Secondary | ICD-10-CM | POA: Diagnosis not present

## 2023-10-19 DIAGNOSIS — I10 Essential (primary) hypertension: Secondary | ICD-10-CM | POA: Diagnosis not present

## 2023-11-29 ENCOUNTER — Telehealth: Payer: Self-pay

## 2024-01-15 DIAGNOSIS — I10 Essential (primary) hypertension: Secondary | ICD-10-CM | POA: Diagnosis not present

## 2024-01-15 DIAGNOSIS — E785 Hyperlipidemia, unspecified: Secondary | ICD-10-CM | POA: Diagnosis not present

## 2024-01-15 DIAGNOSIS — E119 Type 2 diabetes mellitus without complications: Secondary | ICD-10-CM | POA: Diagnosis not present

## 2024-01-18 DIAGNOSIS — M150 Primary generalized (osteo)arthritis: Secondary | ICD-10-CM | POA: Diagnosis not present

## 2024-01-18 DIAGNOSIS — I10 Essential (primary) hypertension: Secondary | ICD-10-CM | POA: Diagnosis not present

## 2024-01-18 DIAGNOSIS — E119 Type 2 diabetes mellitus without complications: Secondary | ICD-10-CM | POA: Diagnosis not present

## 2024-01-18 DIAGNOSIS — E782 Mixed hyperlipidemia: Secondary | ICD-10-CM | POA: Diagnosis not present

## 2024-01-22 ENCOUNTER — Telehealth: Payer: Self-pay

## 2024-01-22 NOTE — Telephone Encounter (Signed)
 Medication arrived from patient assistance  Ozempic  0.25 #4 [facility] [id number] lot [id number] exp 01/02/26 Patient notified to pick up

## 2024-02-06 ENCOUNTER — Ambulatory Visit: Payer: Medicare HMO

## 2024-02-21 ENCOUNTER — Ambulatory Visit: Admitting: Family Medicine

## 2024-02-25 ENCOUNTER — Ambulatory Visit (INDEPENDENT_AMBULATORY_CARE_PROVIDER_SITE_OTHER): Admitting: Family Medicine

## 2024-02-25 ENCOUNTER — Encounter: Payer: Self-pay | Admitting: Family Medicine

## 2024-02-25 ENCOUNTER — Ambulatory Visit (INDEPENDENT_AMBULATORY_CARE_PROVIDER_SITE_OTHER)

## 2024-02-25 VITALS — BP 121/77 | HR 74 | Temp 97.5°F | Ht 66.0 in | Wt 134.5 lb

## 2024-02-25 DIAGNOSIS — I152 Hypertension secondary to endocrine disorders: Secondary | ICD-10-CM

## 2024-02-25 DIAGNOSIS — I25118 Atherosclerotic heart disease of native coronary artery with other forms of angina pectoris: Secondary | ICD-10-CM

## 2024-02-25 DIAGNOSIS — Z7985 Long-term (current) use of injectable non-insulin antidiabetic drugs: Secondary | ICD-10-CM | POA: Diagnosis not present

## 2024-02-25 DIAGNOSIS — K219 Gastro-esophageal reflux disease without esophagitis: Secondary | ICD-10-CM

## 2024-02-25 DIAGNOSIS — Z23 Encounter for immunization: Secondary | ICD-10-CM | POA: Diagnosis not present

## 2024-02-25 DIAGNOSIS — E1169 Type 2 diabetes mellitus with other specified complication: Secondary | ICD-10-CM

## 2024-02-25 DIAGNOSIS — M5030 Other cervical disc degeneration, unspecified cervical region: Secondary | ICD-10-CM | POA: Diagnosis not present

## 2024-02-25 DIAGNOSIS — Z780 Asymptomatic menopausal state: Secondary | ICD-10-CM | POA: Diagnosis not present

## 2024-02-25 DIAGNOSIS — E1159 Type 2 diabetes mellitus with other circulatory complications: Secondary | ICD-10-CM

## 2024-02-25 DIAGNOSIS — E119 Type 2 diabetes mellitus without complications: Secondary | ICD-10-CM | POA: Diagnosis not present

## 2024-02-25 DIAGNOSIS — E785 Hyperlipidemia, unspecified: Secondary | ICD-10-CM

## 2024-02-25 MED ORDER — ESOMEPRAZOLE MAGNESIUM 40 MG PO CPDR
40.0000 mg | DELAYED_RELEASE_CAPSULE | Freq: Every day | ORAL | 4 refills | Status: AC
Start: 2024-02-25 — End: ?

## 2024-02-25 MED ORDER — TIZANIDINE HCL 4 MG PO TABS: 2.0000 mg | ORAL_TABLET | Freq: Three times a day (TID) | ORAL | 0 refills | Status: DC | PRN

## 2024-02-25 NOTE — Addendum Note (Signed)
 Addended by: SHERRE SUZEN PARAS on: 02/25/2024 02:05 PM   Modules accepted: Orders

## 2024-02-25 NOTE — Progress Notes (Signed)
 Subjective: CC:DM PCP: Jolinda Kristin HERO, DO YEP:[name] is a 68 y.o. female presenting to clinic today for:  Type 2 Diabetes with hypertension, hyperlipidemia:  Glucometer:Freestyle Libre 3+.  Metformin and Hyzaar discontinued earlier this year.  Continued on statin, Ozempic  and betablocker.  Does note that the atenolol  was reduced to 25 mg daily only.  She notes that she has been having some intermittent low blood pressures where she is seeing levels as low as 90s over 60s.  She denies any syncope or presyncopal episodes but was getting a little worried about this.  She continues to get Ozempic  through the patient assistance program and will be up for renewal in October.  She has seen Dr. Levern since her last visit.  Average blood sugars have been around 104 and she had an A1c collected at her appointment last month which was 5.9.  She reports some ongoing GERD that is refractory to Prilosec 20 mg so she switched over to Nexium  and that seems to work a little better but she still has breakthrough symptoms.  ROS: No chest pain, shortness of breath, edema.   Diabetes Health Maintenance Due  Topic Date Due   OPHTHALMOLOGY EXAM  Never done   HEMOGLOBIN A1C  02/21/2024   FOOT EXAM  05/22/2024    Neck pain She reports that she has been having some neck pain along the left side of her neck radiating into the left shoulder.  She has history of cervical fusion and is under the care of spine and scoliosis but has not seen them in several years.  She reports that she has been taking Tylenol  up to 3 times a day but cannot take NSAIDs secondary to underlying heart disease.  Does not report any weakness or sensory changes in the left upper extremity but does report headaches that are associated with this that sometimes radiate up into her scalp   ROS: Per HPI  Allergies  Allergen Reactions   Gabapentin      Dizziness and blurred vision   Past Medical History:  Diagnosis Date    Anxiety    Arthritis    GERD (gastroesophageal reflux disease)    Hyperlipidemia    Hypertension     Current Outpatient Medications:    aspirin  81 MG chewable tablet, Chew 81 mg by mouth daily., Disp: , Rfl:    atenolol  (TENORMIN ) 25 MG tablet, Take 25 mg by mouth daily. , Disp: , Rfl: 2   cholecalciferol (VITAMIN D3) 25 MCG (1000 UT) tablet, Take 1,000 Units by mouth daily., Disp: , Rfl:    Continuous Glucose Receiver (FREESTYLE LIBRE 3 READER) DEVI, See admin instructions., Disp: , Rfl:    Continuous Glucose Sensor (FREESTYLE LIBRE 3 PLUS SENSOR) MISC, Change sensor every 15 days.DX: E11.65, Disp: 2 each, Rfl: 5   isosorbide mononitrate  (IMDUR ) 60 MG 24 hr tablet, Take 60 mg by mouth daily., Disp: , Rfl:    nitroGLYCERIN  (NITROSTAT ) 0.4 MG SL tablet, See admin instructions., Disp: , Rfl:    rosuvastatin (CRESTOR) 5 MG tablet, Take 5 mg by mouth daily., Disp: , Rfl:    Semaglutide ,0.25 or 0.5MG /DOS, (OZEMPIC , 0.25 OR 0.5 MG/DOSE,) 2 MG/3ML SOPN, Inject 0.25 mg into the skin every 7 (seven) days for 28 days, THEN 0.5 mg every 7 (seven) days., Disp: 9 mL, Rfl: 3   traZODone  (DESYREL ) 100 MG tablet, Take 0.5-1 tablets (50-100 mg total) by mouth at bedtime as needed for sleep., Disp: 90 tablet, Rfl: 3   vitamin  C (ASCORBIC ACID) 500 MG tablet, Take 500 mg by mouth daily., Disp: , Rfl:    Alpha-Lipoic Acid 600 MG CAPS, Take 1 capsule (600 mg total) by mouth daily. For neuropathy (Patient not taking: Reported on 02/25/2024), Disp: 100 capsule, Rfl: 3   nystatin  (MYCOSTATIN /NYSTOP ) powder, Apply 1 application. topically 3 (three) times daily. (Patient not taking: Reported on 02/25/2024), Disp: 60 g, Rfl: 0   omeprazole  (PRILOSEC) 20 MG capsule, TAKE 1 CAPSULE BY MOUTH EVERY DAY FOR 2 TO 4 WEEKS PER FLARE (Patient not taking: Reported on 02/25/2024), Disp: 90 capsule, Rfl: 3 Social History   Socioeconomic History   Marital status: Married    Spouse name: Not on file   Number of children: 2   Years  of education: Not on file   Highest education level: Not on file  Occupational History   Occupation: disability  Tobacco Use   Smoking status: Never   Smokeless tobacco: Never  Vaping Use   Vaping status: Never Used  Substance and Sexual Activity   Alcohol use: No   Drug use: No   Sexual activity: Not on file  Other Topics Concern   Not on file  Social History Narrative   Lives with her husband. Children and grandchildren live within 10 miles   Social Drivers of Health   Financial Resource Strain: Low Risk  (08/21/2023)   Overall Financial Resource Strain (CARDIA)    Difficulty of Paying Living Expenses: Not very hard  Food Insecurity: No Food Insecurity (08/21/2023)   Hunger Vital Sign    Worried About Running Out of Food in the Last Year: Never true    Ran Out of Food in the Last Year: Never true  Transportation Needs: No Transportation Needs (08/21/2023)   PRAPARE - Administrator, Civil Service (Medical): No    Lack of Transportation (Non-Medical): No  Physical Activity: Inactive (08/21/2023)   Exercise Vital Sign    Days of Exercise per Week: 0 days    Minutes of Exercise per Session: 0 min  Stress: No Stress Concern Present (08/21/2023)   [employer] of Occupational Health - Occupational Stress Questionnaire    Feeling of Stress : Not at all  Social Connections: Moderately Isolated (08/21/2023)   Social Connection and Isolation Panel    Frequency of Communication with Friends and Family: More than three times a week    Frequency of Social Gatherings with Friends and Family: More than three times a week    Attends Religious Services: Never    Database administrator or Organizations: No    Attends Banker Meetings: Never    Marital Status: Married  Catering manager Violence: Not At Risk (08/21/2023)   Humiliation, Afraid, Rape, and Kick questionnaire    Fear of Current or Ex-Partner: No    Emotionally Abused: No    Physically Abused: No     Sexually Abused: No   Family History  Problem Relation Age of Onset   Congestive Heart Failure Mother    Arthritis Mother    Cancer Father        lung   Arthritis/Rheumatoid Maternal Grandmother    Cancer Paternal Grandfather        skin    Objective: Office vital signs reviewed. BP 121/77   Pulse 74   Temp (!) 97.5 F (36.4 C)   Ht 5' 6 (1.676 m)   Wt 134 lb 8 oz (61 kg)   SpO2 100%   BMI 21.71  kg/m   Physical Examination:  General: Awake, alert, nontoxic female, No acute distress HEENT: Sclera white.  Moist mucous membranes Cardio: regular rate and rhythm, S1S2 heard, no murmurs appreciated Pulm: clear to auscultation bilaterally, no wheezes, rhonchi or rales; normal work of breathing on room air Extremities: warm, well perfused, No edema, cyanosis or clubbing; +2 pulses bilaterally MSK: Ambulating with use of cane.  She has no midline tenderness palpation of the spine but does have increased tonicity of the paraspinal muscles and trapezius muscle on the left.  Lab Results  Component Value Date   HGBA1C 5.3 08/21/2023       Assessment/ Plan: 68 y.o. female   Diabetes mellitus treated with injections of non-insulin medication (HCC) - Plan: Microalbumin / creatinine urine ratio, CANCELED: Bayer DCA Hb A1c Waived  Hyperlipidemia associated with type 2 diabetes mellitus (HCC)  Hypertension associated with diabetes (HCC)  Gastroesophageal reflux disease without esophagitis - Plan: esomeprazole  (NEXIUM ) 40 MG capsule  Coronary artery disease of native artery of native heart with stable angina pectoris (HCC)  DDD (degenerative disc disease), cervical - Plan: Ambulatory referral to Neurosurgery, tiZANidine  (ZANAFLEX ) 4 MG tablet  Sugar under good control with A1c of 5.9.  No changes.  She will have urine microalbumin collected today  I reviewed all the labs that were collected by her specialist and cholesterol was well-controlled.  No changes  Her blood pressure  is controlled upon recheck here but she is seeing low blood pressures at home so I have asked that she bring in her cuff for evaluation against ours to ensure that this is an accurate blood pressure reading.  If she is low, we will see if her cardiologist is amenable to reducing the Imdur  to maybe 30 mg daily.  Would hesitate to discontinue beta-blocker given history of CAD  I am going to also refer her back to her spinal specialist for consideration of injection therapy, cervical versus occipital to relieve the headache and spasm that she is getting in that left shoulder  Flu shot cannot be completed today as we do not have it in stock.   Kristin CHRISTELLA Fielding, DO [facility] [phone]

## 2024-02-26 LAB — MICROALBUMIN / CREATININE URINE RATIO
Creatinine, Urine: 43.9 mg/dL
Microalb/Creat Ratio: <7 mg/g{creat} (ref 0–29)
Microalbumin, Urine: <3 ug/mL

## 2024-02-29 ENCOUNTER — Ambulatory Visit: Payer: Self-pay | Admitting: Family Medicine

## 2024-03-04 ENCOUNTER — Ambulatory Visit (INDEPENDENT_AMBULATORY_CARE_PROVIDER_SITE_OTHER): Admitting: *Deleted

## 2024-03-04 VITALS — BP 122/78

## 2024-03-04 DIAGNOSIS — Z23 Encounter for immunization: Secondary | ICD-10-CM

## 2024-03-04 NOTE — Progress Notes (Signed)
 Patient is in office today for a nurse visit to recheck BP and also compare office BP machine readings to patients BP machine readings.  BP with patients BP machine was 143/86.  BP with office BP machine was 122/78.  Patient says she has had her BP machine for about 20 years. Advised patient it may be time to get a new one. Patient voiced understanding and is going to get a new one at the pharmacy.

## 2024-03-13 ENCOUNTER — Ambulatory Visit: Payer: Self-pay

## 2024-03-13 NOTE — Telephone Encounter (Signed)
 FYI Only or Action Required?: FYI only for provider.  Patient was last seen in primary care on 02/25/2024 by Jolinda Norene HERO, DO.  Called Nurse Triage reporting Bladder Prolapse.  Symptoms began today.  Interventions attempted: Nothing.  Symptoms are: stable.  Triage Disposition: See HCP Within 4 Hours (Or PCP Triage)  Patient/caregiver understands and will follow disposition?: Yes- Unable to see provider today, spoke with CAL NP Oneil unable to facilitate this visit and patient requesting female provider as well.  RN rec'd okay from CAL to schedule appt with DOD Rosaline Bruns on 10/10 at 1330. Pt is agreeable. Rn advising patient to monitor for worsening symptoms.   Copied from CRM [id number]. Topic: Clinical - Red Word Triage >> Mar 13, 2024  1:46 PM Larissa S wrote: [facility] that prompted transfer to Nurse Triage: pain in bladder Reason for Disposition  [1] Something is hanging out of the vagina AND [2] can't easily be pushed back inside  Answer Assessment - Initial Assessment Questions 1. SYMPTOM: What's the main symptom you're concerned about? (e.g., pain, itching, dryness)     Feels like a protusion  2. LOCATION: Where is the  protrusion located? (e.g., inside/outside, left/right)     Feels like its outside of the vagina  3. ONSET: When did the  protrustion  start?     Started today  4. PAIN: Is there any pain? If Yes, ask: How bad is it? (Scale: 1-10; mild, moderate, severe)     Bilateral flank pain and pain in bladder area  5. ITCHING: Is there any itching? If Yes, ask: How bad is it? (Scale: 1-10; mild, moderate, severe)     *No Answer* 6. CAUSE: What do you think is causing the discharge? Have you had the same problem before? What happened then?     Patient thinks she has a bladder protusion 7. OTHER SYMPTOMS: Do you have any other symptoms? (e.g., fever, itching, vaginal bleeding, pain with urination, injury to genital area, vaginal foreign  body)     *No Answer* 8. PREGNANCY: Is there any chance you are pregnant? When was your last menstrual period?     no  Protocols used: Vaginal Symptoms-A-AH

## 2024-03-13 NOTE — Telephone Encounter (Signed)
Noted has appt tomorrow

## 2024-03-14 ENCOUNTER — Ambulatory Visit (INDEPENDENT_AMBULATORY_CARE_PROVIDER_SITE_OTHER): Admitting: Family Medicine

## 2024-03-14 VITALS — BP 133/75 | HR 81 | Temp 97.6°F | Ht 66.0 in | Wt 129.4 lb

## 2024-03-14 DIAGNOSIS — N8110 Cystocele, unspecified: Secondary | ICD-10-CM | POA: Diagnosis not present

## 2024-03-14 NOTE — Progress Notes (Signed)
 Subjective:  Patient ID: [name], female    DOB: [birth year], 68 y.o.   MRN: [id number]  Patient Care Team: Jolinda Norene HERO, DO as PCP - General (Family Medicine) Billee Mliss BIRCH, [facility] (Pharmacist) Tricia Tawni CROME, MD as Referring Physician (Dermatology) Ladora Ross Lacy Phebe, MD as Referring Physician (Optometry) Levern Hutching, MD as Consulting Physician (Cardiology) Robinson Pao, MD as Consulting Physician (Dermatology)   Chief Complaint:  Vaginal Prolapse   HPI: [name] is a 68 y.o. female presenting on 03/14/2024 for Vaginal Prolapse   [name] is a 68 year old female who presents with a suspected prolapsed bladder.  She has noticed a bulge, described as a 'prolapsed bulge', accompanied by pressure and low back pain. The bulge was first noticed yesterday, but the pressure and discomfort have been present for a few weeks. The bulge is more noticeable when standing or sitting and less so when lying down.  She experiences nocturia, having to get up every two hours at night to urinate, which is uncomfortable and painful, particularly in her hips. No changes in bowel movements or bladder habits, and no discharge or bleeding. She has not experienced similar symptoms in the past.  Her medical history includes a hysterectomy performed at age 36. She has not seen a gynecologist in approximately 20 years. A CT scan in 2019 revealed some findings, but she was not referred to pelvic rehab or a gynecologist at that time.       Relevant past medical, surgical, family, and social history reviewed and updated as indicated.  Allergies and medications reviewed and updated. Data reviewed: Chart in Epic.   Past Medical History:  Diagnosis Date  . Anxiety   . Arthritis   . GERD (gastroesophageal reflux disease)   . Hyperlipidemia   . Hypertension     Past Surgical History:  Procedure Laterality Date  . ABDOMINAL HYSTERECTOMY    . CERVICAL FUSION    . KNEE  ARTHROSCOPY Left   . LUMBAR FUSION    . skin cancer removal  09/25/2019   right thigh    Social History   Socioeconomic History  . Marital status: Married    Spouse name: Not on file  . Number of children: 2  . Years of education: Not on file  . Highest education level: 10th grade  Occupational History  . Occupation: disability  Tobacco Use  . Smoking status: Never  . Smokeless tobacco: Never  Vaping Use  . Vaping status: Never Used  Substance and Sexual Activity  . Alcohol use: No  . Drug use: No  . Sexual activity: Not on file  Other Topics Concern  . Not on file  Social History Narrative   Lives with her husband. Children and grandchildren live within 10 miles   Social Drivers of Health   Financial Resource Strain: Low Risk  (03/14/2024)   Overall Financial Resource Strain (CARDIA)   . Difficulty of Paying Living Expenses: Not very hard  Food Insecurity: No Food Insecurity (03/14/2024)   Hunger Vital Sign   . Worried About Programme researcher, broadcasting/film/video in the Last Year: Never true   . Ran Out of Food in the Last Year: Never true  Transportation Needs: No Transportation Needs (03/14/2024)   PRAPARE - Transportation   . Lack of Transportation (Medical): No   . Lack of Transportation (Non-Medical): No  Physical Activity: Inactive (03/14/2024)   Exercise Vital Sign   . Days of Exercise per  Week: 0 days   . Minutes of Exercise per Session: Not on file  Stress: No Stress Concern Present (03/14/2024)   [employer] of Occupational Health - Occupational Stress Questionnaire   . Feeling of Stress: Only a little  Social Connections: Moderately Integrated (03/14/2024)   Social Connection and Isolation Panel   . Frequency of Communication with Friends and Family: More than three times a week   . Frequency of Social Gatherings with Friends and Family: Three times a week   . Attends Religious Services: More than 4 times per year   . Active Member of Clubs or Organizations: No    . Attends Banker Meetings: Not on file   . Marital Status: Married  Catering manager Violence: Not At Risk (08/21/2023)   Humiliation, Afraid, Rape, and Kick questionnaire   . Fear of Current or Ex-Partner: No   . Emotionally Abused: No   . Physically Abused: No   . Sexually Abused: No    Outpatient Encounter Medications as of 03/14/2024  Medication Sig  . Alpha-Lipoic Acid 600 MG CAPS Take 1 capsule (600 mg total) by mouth daily. For neuropathy  . aspirin  81 MG chewable tablet Chew 81 mg by mouth daily.  . atenolol  (TENORMIN ) 25 MG tablet Take 25 mg by mouth daily.   . cholecalciferol (VITAMIN D3) 25 MCG (1000 UT) tablet Take 1,000 Units by mouth daily.  . Continuous Glucose Receiver (FREESTYLE LIBRE 3 READER) DEVI See admin instructions.  . Continuous Glucose Sensor (FREESTYLE LIBRE 3 PLUS SENSOR) MISC Change sensor every 15 days.DX: E11.65  . esomeprazole  (NEXIUM ) 40 MG capsule Take 1 capsule (40 mg total) by mouth daily.  . isosorbide mononitrate  (IMDUR ) 60 MG 24 hr tablet Take 60 mg by mouth daily.  . nitroGLYCERIN  (NITROSTAT ) 0.4 MG SL tablet See admin instructions.  . nystatin  (MYCOSTATIN /NYSTOP ) powder Apply 1 application. topically 3 (three) times daily.  . rosuvastatin (CRESTOR) 5 MG tablet Take 5 mg by mouth daily.  . Semaglutide ,0.25 or 0.5MG /DOS, (OZEMPIC , 0.25 OR 0.5 MG/DOSE,) 2 MG/3ML SOPN Inject 0.25 mg into the skin every 7 (seven) days for 28 days, THEN 0.5 mg every 7 (seven) days.  . tiZANidine  (ZANAFLEX ) 4 MG tablet Take 0.5-1 tablets (2-4 mg total) by mouth every 8 (eight) hours as needed for muscle spasms.  . traZODone  (DESYREL ) 100 MG tablet Take 0.5-1 tablets (50-100 mg total) by mouth at bedtime as needed for sleep.  . vitamin C (ASCORBIC ACID) 500 MG tablet Take 500 mg by mouth daily.   No facility-administered encounter medications on file as of 03/14/2024.    Allergies  Allergen Reactions  . Gabapentin      Dizziness and blurred vision     Pertinent ROS per HPI, otherwise unremarkable      Objective:  BP 133/75   Pulse 81   Temp 97.6 F (36.4 C)   Ht 5' 6 (1.676 m)   Wt 129 lb 6.4 oz (58.7 kg)   SpO2 99%   BMI 20.89 kg/m    Wt Readings from Last 3 Encounters:  03/14/24 129 lb 6.4 oz (58.7 kg)  02/25/24 134 lb 8 oz (61 kg)  08/21/23 149 lb 6.4 oz (67.8 kg)    Physical Exam Vitals and nursing note reviewed. Exam conducted with a chaperone present.  Constitutional:      General: She is not in acute distress.    Appearance: Normal appearance. She is normal weight. She is not ill-appearing, toxic-appearing or diaphoretic.  HENT:  Head: Normocephalic and atraumatic.     Mouth/Throat:     Mouth: Mucous membranes are moist.  Eyes:     Pupils: Pupils are equal, round, and reactive to light.  Cardiovascular:     Rate and Rhythm: Normal rate and regular rhythm.     Heart sounds: Normal heart sounds.  Pulmonary:     Effort: Pulmonary effort is normal.     Breath sounds: Normal breath sounds.  Abdominal:     General: Bowel sounds are normal.     Palpations: Abdomen is soft.     Hernia: There is no hernia in the left inguinal area or right inguinal area.  Genitourinary:    Exam position: Lithotomy position.     Pubic Area: No rash or pubic lice.      Tanner stage (genital): 5.     Labia:        Right: No rash, tenderness, lesion or injury.        Left: No rash, tenderness, lesion or injury.      Urethra: No prolapse, urethral pain, urethral swelling or urethral lesion.     Vagina: Prolapsed vaginal walls (anterior wall) present.  Musculoskeletal:     Right lower leg: No edema.     Left lower leg: No edema.  Lymphadenopathy:     Lower Body: No right inguinal adenopathy. No left inguinal adenopathy.  Skin:    General: Skin is warm and dry.     Capillary Refill: Capillary refill takes less than 2 seconds.  Neurological:     General: No focal deficit present.     Mental Status: She is alert and  oriented to person, place, and time.  Psychiatric:        Mood and Affect: Mood normal.        Behavior: Behavior normal.        Thought Content: Thought content normal.        Judgment: Judgment normal.      Results for orders placed or performed in visit on 02/25/24  Microalbumin / creatinine urine ratio   Collection Time: 02/25/24  1:44 PM  Result Value Ref Range   Creatinine, Urine 43.9 Not Estab. mg/dL   Microalbumin, Urine <6.9 Not Estab. ug/mL   Microalb/Creat Ratio <7 0 - 29 mg/g creat       Pertinent labs & imaging results that were available during my care of the patient were reviewed by me and considered in my medical decision making.  Assessment & Plan:  [name] was seen today for vaginal prolapse.  Diagnoses and all orders for this visit:  Vaginal prolapse -     Ambulatory referral to Urogynecology -     Urinalysis, Routine w reflex microscopic -     Urine Culture     Anterior vaginal wall prolapse (cystocele) Anterior vaginal wall prolapse, likely involving the bladder, presents as a bulge with associated pressure, hip pain, and low back discomfort. Symptoms have persisted for a few weeks, with the bulge noticed yesterday. Increased nocturia is reported, but no alterations in bowel or bladder habits, discharge, or bleeding. The prolapse is more pronounced when standing or sitting. Given her hysterectomy, the prolapse is likely bladder-related, with differential including bladder prolapse due to the absence of a uterus. - Discussed management options: pessary, pelvic floor rehabilitation, and surgical intervention. - Discussed risks of worsening symptoms, such as inability to void or have bowel movements, and instructed to seek immediate care if these occur. - Order urgent referral  to urogynecology, with preference for a female provider if possible. - Check urine sample to rule out infection.          Continue all other maintenance medications.  Follow up  plan: Return if symptoms worsen or fail to improve.   The above assessment and management plan was discussed with the patient. The patient verbalized understanding of and has agreed to the management plan. Patient is aware to call the clinic if they develop any new symptoms or if symptoms persist or worsen. Patient is aware when to return to the clinic for a follow-up visit. Patient educated on when it is appropriate to go to the emergency department.   Kristin Bruns, FNP-C [facility] [phone]

## 2024-03-26 ENCOUNTER — Encounter: Payer: Self-pay | Admitting: Obstetrics and Gynecology

## 2024-03-26 ENCOUNTER — Other Ambulatory Visit (HOSPITAL_COMMUNITY)
Admission: RE | Admit: 2024-03-26 | Discharge: 2024-03-26 | Disposition: A | Discharge status: Home / Self Care | Source: Other Acute Inpatient Hospital | Attending: Obstetrics and Gynecology | Admitting: Obstetrics and Gynecology

## 2024-03-26 ENCOUNTER — Ambulatory Visit: Admitting: Obstetrics and Gynecology

## 2024-03-26 VITALS — BP 126/74 | HR 75 | Ht 63.8 in | Wt 128.6 lb

## 2024-03-26 DIAGNOSIS — R350 Frequency of micturition: Secondary | ICD-10-CM

## 2024-03-26 DIAGNOSIS — R339 Retention of urine, unspecified: Secondary | ICD-10-CM | POA: Insufficient documentation

## 2024-03-26 DIAGNOSIS — R82998 Other abnormal findings in urine: Secondary | ICD-10-CM | POA: Insufficient documentation

## 2024-03-26 DIAGNOSIS — Z90710 Acquired absence of both cervix and uterus: Secondary | ICD-10-CM

## 2024-03-26 DIAGNOSIS — R351 Nocturia: Secondary | ICD-10-CM | POA: Diagnosis not present

## 2024-03-26 DIAGNOSIS — N993 Prolapse of vaginal vault after hysterectomy: Secondary | ICD-10-CM | POA: Diagnosis not present

## 2024-03-26 LAB — POCT URINALYSIS DIP (CLINITEK)
Bilirubin, UA: NEGATIVE
Blood, UA: NEGATIVE
Glucose, UA: NEGATIVE mg/dL
Ketones, POC UA: NEGATIVE mg/dL
Nitrite, UA: NEGATIVE
POC PROTEIN,UA: NEGATIVE
Spec Grav, UA: <=1.005 — AB (ref 1.010–1.025)
Urobilinogen, UA: 0.2 U/dL
pH, UA: 5.5 (ref 5.0–8.0)

## 2024-03-26 NOTE — Assessment & Plan Note (Signed)
-   Will evaluate further with urodynamic testing. We discussed this may be neurogenic or related to urethral kinking from prolapse.  - Also will order renal US  to assess for hydronephrosis. Normal renal function testing within the last year.

## 2024-03-26 NOTE — Assessment & Plan Note (Signed)
-   Suspect may be due to incomplete emptying.  - Also advised to avoid drinking a few hours prior to bedtime.

## 2024-03-26 NOTE — Addendum Note (Signed)
 Addended by: MARILYNNE ROSALINE SAILOR on: 03/26/2024 02:35 PM   Modules accepted: Orders

## 2024-03-26 NOTE — Progress Notes (Signed)
 New Patient Evaluation and Consultation  Referring Provider: Severa Rock HERO, FNP PCP: Jolinda Norene HERO, DO Date of Service: 03/26/2024  SUBJECTIVE Chief Complaint: New Patient (Initial Visit) [name] is a 68 y.o. female is here for vaginal prolapse)  History of Present Illness: [name] is a 68 y.o. White or Caucasian female seen in consultation at the request of NP Rock Severa for evaluation of prolapse.     Urinary Symptoms: Does not leak urine.   Day time voids 2-3.  Nocturia: 5-6 times per night to void. Voiding dysfunction:  does not empty bladder well.  Patient does not use a catheter to empty bladder.  When urinating, patient feels a weak stream and dribbling after finishing Drinks: hot tea in AM otherwise water per day. Drinks before going to bed.   UTIs: 1 UTI's in the last year.   Denies history of blood in urine and kidney or bladder stones   Pelvic Organ Prolapse Symptoms:                  Patient Admits to a feeling of a bulge the vaginal area. It has been present for 2 weeks.  Patient Admits to seeing a bulge.  This bulge is bothersome. Has not had any prior treatment.   Bowel Symptom: Bowel movements: every 3-4 days Stool consistency: soft  Straining: yes.  Splinting: no.  Incomplete evacuation: no.  Patient Denies accidental bowel leakage / fecal incontinence Bowel regimen: none  Sexual Function Sexually active: no.    Pelvic Pain Admits to pelvic pain Location: hip to hip Pain occurs: all the time Prior pain treatment: nothing Improved by: laying down with legs propped Worsened by: walking, standing   Past Medical History:  Past Medical History:  Diagnosis Date   Anxiety    Arthritis    Diabetes mellitus without complication (HCC)    GERD (gastroesophageal reflux disease)    Hyperlipidemia    Hypertension      Past Surgical History:   Past Surgical History:  Procedure Laterality Date   ABDOMINAL HYSTERECTOMY      CERVICAL FUSION     KNEE ARTHROSCOPY Left    LUMBAR FUSION     skin cancer removal  09/25/2019   right thigh     Past OB/GYN History: OB History  Gravida Para Term Preterm AB Living  2 2 2   2   SAB IAB Ectopic Multiple Live Births          # Outcome Date GA Lbr Len/2nd Weight Sex Type Anes PTL Lv  2 Term      Vag-Spont     1 Term      Vag-Forceps       S/p hysterectomy  Medications: Patient has a current medication list which includes the following prescription(s): alpha-lipoic acid, aspirin , atenolol , cholecalciferol, freestyle libre 3 reader, freestyle libre 3 plus sensor, denta 5000 plus, dentagel, esomeprazole , isosorbide mononitrate , nitroglycerin , nystatin , rosuvastatin, ozempic  (0.25 or 0.5 mg/dose), tizanidine , trazodone , and ascorbic acid.   Allergies: Patient is allergic to gabapentin .   Social History:  Social History   Tobacco Use   Smoking status: Never   Smokeless tobacco: Never  Vaping Use   Vaping status: Never Used  Substance Use Topics   Alcohol use: No   Drug use: No    Relationship status: married Patient lives with her husband.   Patient is not employed. Regular exercise: No History of abuse: No  Family History:   Family History  Problem Relation Age of Onset   Congestive Heart Failure Mother    Arthritis Mother    Cancer Father        lung   Arthritis/Rheumatoid Maternal Grandmother    Cancer Paternal Grandfather        skin     Review of Systems: Review of Systems  Constitutional:  Positive for malaise/fatigue. Negative for fever and weight loss.  Respiratory:  Negative for cough, shortness of breath and wheezing.   Cardiovascular:  Negative for chest pain, palpitations and leg swelling.  Gastrointestinal:  Positive for abdominal pain. Negative for blood in stool.  Genitourinary:  Positive for dysuria.  Musculoskeletal:  Positive for myalgias.  Skin:  Negative for rash.  Neurological:  Positive for headaches. Negative for  dizziness.  Endo/Heme/Allergies:  Bruises/bleeds easily.  Psychiatric/Behavioral:  Negative for depression. The patient is nervous/anxious.      OBJECTIVE Physical Exam: Vitals:   03/26/24 1258  BP: 126/74  Pulse: 75  Weight: 128 lb 9.6 oz (58.3 kg)  Height: 5' 3.8 (1.621 m)    Physical Exam Vitals reviewed. Exam conducted with a chaperone present.  Constitutional:      General: She is not in acute distress. Pulmonary:     Effort: Pulmonary effort is normal.  Abdominal:     General: There is no distension.     Palpations: Abdomen is soft.     Tenderness: There is no abdominal tenderness. There is no rebound.  Musculoskeletal:        General: No swelling. Normal range of motion.  Skin:    General: Skin is warm and dry.     Findings: No rash.  Neurological:     Mental Status: She is alert and oriented to person, place, and time.  Psychiatric:        Mood and Affect: Mood normal.        Behavior: Behavior normal.      GU / Detailed Urogynecologic Evaluation:  Pelvic Exam: Normal external female genitalia; Bartholin's and Skene's glands normal in appearance; urethral meatus normal with small caruncle, no urethral masses or discharge.   CST: negative s/p hysterectomy: Speculum exam reveals normal vaginal mucosa with  atrophy and normal vaginal cuff.  Adnexa no mass, fullness, tenderness.    Pelvic floor strength I/V, puborectalis II/V external anal sphincter III/V  Pelvic floor musculature: Right levator tender, Right obturator tender, Left levator tender, Left obturator tender  POP-Q:   POP-Q  0.5                                            Aa   0.5                                           Ba  -5                                              C   3  Gh  4                                            Pb  7.5                                            tvl   -2                                            Ap  -2                                             Bp                                                 D      Rectal Exam:  Normal sphincter tone, small distal rectocele, enterocoele not present, no rectal masses, no sign of dyssynergia when asking the patient to bear down.  Straight Catheterization Procedure for PVR: After verbal consent was obtained from the patient for catheterization to assess bladder emptying and residual volume the urethra and surrounding tissues were prepped with betadine and an in and out catheterization was performed.  PVR was .  Urine appeared clear yellow. The patient tolerated the procedure well.    Laboratory Results: Lab Results  Component Value Date   COLORU yellow 03/26/2024   CLARITYU clear 03/26/2024   GLUCOSEUR negative 03/26/2024   BILIRUBINUR negative 03/26/2024   KETONESU Negative 08/03/2020   SPECGRAV <=1.005 (A) 03/26/2024   RBCUR negative 03/26/2024   PHUR 5.5 03/26/2024   PROTEINUR Negative 08/03/2020   UROBILINOGEN 0.2 03/26/2024   LEUKOCYTESUR Small (1+) (A) 03/26/2024    Lab Results  Component Value Date   CREATININE 0.81 08/21/2023   CREATININE 0.90 05/23/2023   CREATININE 0.90 12/14/2020    Lab Results  Component Value Date   HGBA1C 5.3 08/21/2023    Lab Results  Component Value Date   HGB 13.3 12/14/2020     ASSESSMENT AND PLAN Ms. Vahle is a 68 y.o. with:  1. Vaginal vault prolapse after hysterectomy   2. Incomplete bladder emptying   3. Nocturia   4. Urinary frequency   5. Leukocytes in urine     Vaginal vault prolapse after hysterectomy Assessment & Plan: Stage II anterior, Stage I posterior, Stage I apical prolapse - For treatment of pelvic organ prolapse, we discussed options for management including expectant management, conservative management, and surgical management, such as Kegels, a pessary, pelvic floor physical therapy, and specific surgical procedures. - We discussed two options for prolapse repair:   1) vaginal repair without mesh - Pros - safer, no mesh complications - Cons - not as strong as mesh repair, higher risk of recurrence  2) laparoscopic repair with mesh - Pros - stronger, better long-term success - Cons - risks of mesh implant (erosion into vagina or bladder,  adhering to the rectum, pain) - these risks are lower than with a vaginal mesh but still exist - With her stage of prolapse and pelvic pain, recommended Anterior repair, sacrospinous fixation with possible posterior repair. She would like to proceed with surgery.    Incomplete bladder emptying Assessment & Plan: - Will evaluate further with urodynamic testing. We discussed this may be neurogenic or related to urethral kinking from prolapse.  - Also will order renal US  to assess for hydronephrosis. Normal renal function testing within the last year.   Orders: -     US  RENAL; Future  Nocturia Assessment & Plan: - Suspect may be due to incomplete emptying.  - Also advised to avoid drinking a few hours prior to bedtime.    Urinary frequency -     POCT URINALYSIS DIP (CLINITEK)  Leukocytes in urine -     Urine Culture; Future  Return for urodynamics   [name], MD

## 2024-03-26 NOTE — Assessment & Plan Note (Signed)
 Stage II anterior, Stage I posterior, Stage I apical prolapse - For treatment of pelvic organ prolapse, we discussed options for management including expectant management, conservative management, and surgical management, such as Kegels, a pessary, pelvic floor physical therapy, and specific surgical procedures. - We discussed two options for prolapse repair:  1) vaginal repair without mesh - Pros - safer, no mesh complications - Cons - not as strong as mesh repair, higher risk of recurrence  2) laparoscopic repair with mesh - Pros - stronger, better long-term success - Cons - risks of mesh implant (erosion into vagina or bladder, adhering to the rectum, pain) - these risks are lower than with a vaginal mesh but still exist - With her stage of prolapse and pelvic pain, recommended Anterior repair, sacrospinous fixation with possible posterior repair. She would like to proceed with surgery.

## 2024-03-26 NOTE — Patient Instructions (Addendum)
 Constipation: Our goal is to achieve formed bowel movements daily or every-other-day.  You may need to try different combinations of the following options to find what works best for you - everybody's body works differently so feel free to adjust the dosages as needed.  Some options to help maintain bowel health include:  Dietary changes (more leafy greens, vegetables and fruits; less processed foods) Fiber supplementation (Benefiber, FiberCon, Metamucil or Psyllium). Start slow and increase gradually to full dose. Over-the-counter agents such as: stool softeners (Docusate or Colace) and/or laxatives (Miralax, milk of magnesia)  Power Pudding is a natural mixture that may help your constipation.  To make blend 1 cup applesauce, 1 cup wheat bran, and 3/4 cup prune juice, refrigerate and then take 1 tablespoon daily with a large glass of water as needed.   You have a stage 2 (out of 4) prolapse.  We discussed the fact that it is not life threatening but there are several treatment options. For treatment of pelvic organ prolapse, we discussed options for management including expectant management, conservative management, and surgical management, such as Kegels, a pessary, pelvic floor physical therapy, and specific surgical procedures.    Plan for surgery: sacrospinous fixation, anterior repair, possible posterior repair, cystoscopy  URODYNAMICS (UDS) TEST INFORMATION  IMPORTANT: Please try to arrive with a comfortably full bladder!   What is UDS? Urodynamics is a bladder test used to evaluate how your bladder and urethra (tube you urinate out of) work to help find out the cause of your bladder symptoms and evaluate your bladder function in order to make the best treatment plan for you.   What to expect? A nurse will perform the test and will be with you during the entire exam. First we will have to empty your bladder on a special toilet.  After you have emptied your bladder, very small catheters  (plastic tubing) will be placed into your bladder and into your vagina (or rectum). These special small catheters measure pressure to help measure your bladder function.  Your bladder will be gently filled with water and you will be asked to cough and strain at several different points during the test.   You will then be asked to empty your bladder in the special toilet with the catheters in place. Most patients can urinate (pee) easily with the catheters in place since the catheters are so small. In total this procedure lasts about 45 minutes to 1 hour.  After your test is completed, you will return (or possibly be seen the same day) to review the results, talk about treatment options and make a plan moving forward.

## 2024-03-27 LAB — URINE CULTURE: Culture: NO GROWTH

## 2024-04-04 ENCOUNTER — Ambulatory Visit (HOSPITAL_COMMUNITY)
Admission: RE | Admit: 2024-04-04 | Discharge: 2024-04-04 | Disposition: A | Discharge status: Home / Self Care | Source: Ambulatory Visit | Attending: Obstetrics and Gynecology | Admitting: Obstetrics and Gynecology

## 2024-04-04 DIAGNOSIS — R339 Retention of urine, unspecified: Secondary | ICD-10-CM | POA: Insufficient documentation

## 2024-04-04 DIAGNOSIS — R39198 Other difficulties with micturition: Secondary | ICD-10-CM | POA: Diagnosis not present

## 2024-04-08 ENCOUNTER — Ambulatory Visit

## 2024-04-08 ENCOUNTER — Other Ambulatory Visit: Payer: Self-pay | Admitting: Family Medicine

## 2024-04-08 DIAGNOSIS — Z7985 Long-term (current) use of injectable non-insulin antidiabetic drugs: Secondary | ICD-10-CM

## 2024-04-11 ENCOUNTER — Telehealth: Payer: Self-pay

## 2024-04-11 NOTE — Telephone Encounter (Signed)
   Emailed refills to [facility]

## 2024-04-21 ENCOUNTER — Ambulatory Visit: Payer: Self-pay

## 2024-04-25 DIAGNOSIS — M150 Primary generalized (osteo)arthritis: Secondary | ICD-10-CM | POA: Diagnosis not present

## 2024-04-25 DIAGNOSIS — I10 Essential (primary) hypertension: Secondary | ICD-10-CM | POA: Diagnosis not present

## 2024-04-25 DIAGNOSIS — E782 Mixed hyperlipidemia: Secondary | ICD-10-CM | POA: Diagnosis not present

## 2024-04-25 DIAGNOSIS — E119 Type 2 diabetes mellitus without complications: Secondary | ICD-10-CM | POA: Diagnosis not present

## 2024-04-28 NOTE — Telephone Encounter (Signed)
 Faxed Ozempic  0.5mg  refills to [facility]

## 2024-05-08 DIAGNOSIS — M542 Cervicalgia: Secondary | ICD-10-CM | POA: Diagnosis not present

## 2024-05-08 DIAGNOSIS — Z981 Arthrodesis status: Secondary | ICD-10-CM | POA: Diagnosis not present

## 2024-05-08 DIAGNOSIS — Z1330 Encounter for screening examination for mental health and behavioral disorders, unspecified: Secondary | ICD-10-CM | POA: Diagnosis not present

## 2024-05-08 DIAGNOSIS — M47812 Spondylosis without myelopathy or radiculopathy, cervical region: Secondary | ICD-10-CM | POA: Diagnosis not present

## 2024-05-08 DIAGNOSIS — G5622 Lesion of ulnar nerve, left upper limb: Secondary | ICD-10-CM | POA: Diagnosis not present

## 2024-05-27 ENCOUNTER — Ambulatory Visit: Payer: Self-pay

## 2024-06-06 ENCOUNTER — Ambulatory Visit: Admitting: Obstetrics

## 2024-06-11 ENCOUNTER — Encounter (HOSPITAL_COMMUNITY): Payer: Self-pay | Admitting: Obstetrics and Gynecology

## 2024-06-11 ENCOUNTER — Ambulatory Visit: Admitting: Obstetrics and Gynecology

## 2024-06-11 ENCOUNTER — Encounter (HOSPITAL_COMMUNITY): Payer: Self-pay

## 2024-06-11 ENCOUNTER — Encounter: Payer: Self-pay | Admitting: Obstetrics and Gynecology

## 2024-06-11 VITALS — BP 154/74 | HR 80

## 2024-06-11 VITALS — BP 137/80 | HR 73 | Ht 63.11 in | Wt 127.2 lb

## 2024-06-11 DIAGNOSIS — N993 Prolapse of vaginal vault after hysterectomy: Secondary | ICD-10-CM

## 2024-06-11 DIAGNOSIS — R339 Retention of urine, unspecified: Secondary | ICD-10-CM

## 2024-06-11 DIAGNOSIS — R350 Frequency of micturition: Secondary | ICD-10-CM

## 2024-06-11 DIAGNOSIS — Z01818 Encounter for other preprocedural examination: Secondary | ICD-10-CM

## 2024-06-11 DIAGNOSIS — R351 Nocturia: Secondary | ICD-10-CM

## 2024-06-11 DIAGNOSIS — N3281 Overactive bladder: Secondary | ICD-10-CM | POA: Diagnosis not present

## 2024-06-11 LAB — POCT URINALYSIS DIP (CLINITEK)
Bilirubin, UA: NEGATIVE
Glucose, UA: NEGATIVE mg/dL
Ketones, POC UA: NEGATIVE mg/dL
Nitrite, UA: NEGATIVE
POC PROTEIN,UA: NEGATIVE
Spec Grav, UA: <=1.005 — AB
Urobilinogen, UA: 0.2 U/dL
pH, UA: 6

## 2024-06-11 MED ORDER — OXYCODONE HCL 5 MG PO TABS: 5.0000 mg | ORAL_TABLET | ORAL | 0 refills | Status: AC | PRN

## 2024-06-11 MED ORDER — IBUPROFEN 600 MG PO TABS: 600.0000 mg | ORAL_TABLET | Freq: Four times a day (QID) | ORAL | 0 refills | Status: AC | PRN

## 2024-06-11 MED ORDER — POLYETHYLENE GLYCOL 3350 17 GM/SCOOP PO POWD: 17.0000 g | Freq: Every day | ORAL | 0 refills | Status: AC

## 2024-06-11 MED ORDER — ACETAMINOPHEN 500 MG PO TABS: 500.0000 mg | ORAL_TABLET | Freq: Four times a day (QID) | ORAL | 0 refills | Status: AC | PRN

## 2024-06-11 MED ORDER — ESTRADIOL 0.01 % VA CREA: 0.5000 g | TOPICAL_CREAM | VAGINAL | 11 refills | Status: AC

## 2024-06-11 NOTE — Progress Notes (Signed)
 Spoke w/ via phone for pre-op interview--- [facility] needs dos----  CBG, BMP, EKG, A1C per anesthesia.        Lab results------ COVID test -----patient states asymptomatic no test needed Arrive at -------1015 NPO after MN NO Solid Food.  Clear liquids from MN until---0915 Pre-Surgery Ensure or G2:  Med rec completed Medications to take morning of surgery -----Atenolol and Nexium . Diabetic medication -----  GLP1 agonist last dose: Ozempic  0.5mg  weekly. Last dose 06/07/24. GLP1 instructions: No further doses until after surgery.  Patient instructed no nail polish to be worn day of surgery Patient instructed to bring photo id and insurance card day of surgery Patient aware to have Driver (ride ) / caregiver    for 24 hours after surgery - Husband Suzana Sohail Patient Special Instructions ----- Suellyn rasher will be on right upper arm. Pt told to bring supplies in case current sensor comes off. Pre-Op special Instructions -----  Patient verbalized understanding of instructions that were given at this phone interview. Patient denies chest pain, sob, fever, cough at the interview.

## 2024-06-11 NOTE — Progress Notes (Signed)
 [facility] Urogynecology Urodynamics Procedure  Referring Physician: Jolinda Norene HERO, DO Date of Procedure: 06/11/2024  [name] is a 69 y.o. female who presents for urodynamic evaluation. Indication(s) for study: mixed incontinence and neurogenic bladder  Vital Signs: BP (!) 154/74   Pulse 80   Laboratory Results: A catheterized urine specimen revealed:  POC urine:  Lab Results  Component Value Date   COLORU yellow 06/11/2024   CLARITYU clear 06/11/2024   GLUCOSEUR negative 06/11/2024   BILIRUBINUR negative 06/11/2024   KETONESU Negative 08/03/2020   SPECGRAV <=1.005 (A) 06/11/2024   RBCUR trace-intact (A) 06/11/2024   PHUR 6.0 06/11/2024   PROTEINUR Negative 08/03/2020   UROBILINOGEN 0.2 06/11/2024   LEUKOCYTESUR Trace (A) 06/11/2024     Voiding Diary: Deferred  Procedure Timeout:  The correct patient was verified and the correct procedure was verified. The patient was in the correct position and safety precautions were reviewed based on at the patient's history.  Urodynamic Procedure A 48F dual lumen urodynamics catheter was placed under sterile conditions into the patient's bladder. A 48F catheter was placed into the rectum in order to measure abdominal pressure. EMG patches were placed in the appropriate position.  All connections were confirmed and calibrations/adjusted made. Saline was instilled into the bladder through the dual lumen catheters.  Cough/valsalva pressures were measured periodically during filling.  Patient was allowed to void.  The bladder was then emptied of its residual.  UROFLOW: Revealed a Qmax of 17 mL/sec.  She voided 266 mL and had a residual of 110 mL.  It was a normal pattern and represented normal habits.  CMG: This was performed with sterile water in the sitting position at a fill rate of 30-50 mL/min.    First sensation of fullness was 390 mLs,  First urge was 537 mLs,  Strong urge was 551 mLs and  Capacity was 809  mLs  Stress incontinence was not demonstrated Highest negative Barrier CLPP was 83 cmH20 at 401 ml. Highest negative Barrier VLPP was 100 cmH20 at 401 ml.  Detrusor function was overactive, with phasic contractions seen.  The first occurred at 76 mL to 6 cm of water and was not associated with urge.  Compliance:  Normal. End fill detrusor pressure was 2cmH20.  Calculated compliance was 442mL/cmH20  UPP: MUCP with barrier reduction was 79 cm of water.    MICTURITION STUDY: Voiding was performed with reduction using scopettes in the sitting position.  Pdet at Qmax was 47 cm of water.  Qmax was 21 mL/sec.  It was a normal pattern.  She voided 749 mL and had a residual of 60 mL.  It was a volitional void, sustained detrusor contraction was present and abdominal straining was not present  EMG: This was performed with patches.  She had voluntary contractions, recruitment with fill was present and urethral sphincter was relaxed with void.  The details of the procedure with the study tracings have been scanned into EPIC.   Urodynamic Impression:  1. Sensation was reduced; capacity was increased 2. Stress Incontinence was not demonstrated at normal pressures; 3. 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 4. Emptying was normal with a normal PVR, a sustained detrusor contraction present,  abdominal straining not present, normal urethral sphincter activity on EMG.  Plan: - The patient will follow up  to discuss the findings and treatment options.

## 2024-06-11 NOTE — H&P (View-Only) (Signed)
 [facility] Return Visit  Subjective Chief Complaint: [name] presents return visit  History of Present Illness: [name] is a 69 y.o. female with Stage II POP. She is scheduled to undergo Exam under anesthesia, Anterior repair, sacrospinous fixation, cystoscopy, possible posterior repair on 06/17/24.  Her symptoms include vaginal bulge and incomplete bladder emtpying, and she was was found to have Stage II anterior, Stage 0 posterior, Stage I apical prolapse.   Urodynamic Impression:  1. Sensation was reduced; capacity was increased 2. Stress Incontinence was not demonstrated at normal pressures; 3. 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 4. Emptying was normal with a normal PVR, a sustained detrusor contraction present,  abdominal straining not present, normal urethral sphincter activity on EMG.  Past Medical History:  Diagnosis Date   Anxiety    Arthritis    Diabetes mellitus without complication (HCC)    GERD (gastroesophageal reflux disease)    Hyperlipidemia    Hypertension      Past Surgical History:  Procedure Laterality Date   ABDOMINAL HYSTERECTOMY     CERVICAL FUSION     KNEE ARTHROSCOPY Left    LUMBAR FUSION     skin cancer removal  09/25/2019   right thigh    is allergic to gabapentin .   Family History  Problem Relation Age of Onset   Congestive Heart Failure Mother    Arthritis Mother    Lung cancer Father    Bone cancer Father    Arthritis/Rheumatoid Maternal Grandmother    Skin cancer Paternal Grandfather    Uterine cancer Neg Hx    Bladder Cancer Neg Hx    Renal cancer Neg Hx     Social History[1]   Review of Systems was negative for a full 10 system review except as noted in the History of Present Illness.  Current Medications[2]   Objective There were no vitals filed for this visit.  Gen: NAD CV: S1 S2 RRR Lungs: Clear to auscultation bilaterally Abd: soft, nontender   Previous Pelvic Exam  showed: POP-Q   0.5                                            Aa   0.5                                           Ba   -5                                              C    3                                            Gh   4                                            Pb   7.5  tvl    -2                                            Ap   -2                                            Bp                                                  D         Assessment/ Plan  Assessment: The patient is a 69 y.o. year old scheduled to undergo Exam under anesthesia, Anterior repair, sacrospinous fixation, cystoscopy, possible posterior repair.   Plan: General Surgical Consent: The patient has previously been counseled on alternative treatments, and the decision by the patient and provider was to proceed with the procedure listed above.  For all procedures, there are risks of bleeding, infection, damage to surrounding organs including but not limited to bowel, bladder, blood vessels, ureters and nerves, and need for further surgery if an injury were to occur. These risks are all low with minimally invasive surgery.   There are risks of numbness and weakness at any body site or buttock/rectal pain.  It is possible that baseline pain can be worsened by surgery, either with or without mesh. If surgery is vaginal, there is also a low risk of possible conversion to laparoscopy or open abdominal incision where indicated. Very rare risks include blood transfusion, blood clot, heart attack, pneumonia, or death.   There is also a risk of short-term postoperative urinary retention with need to use a catheter. About half of patients need to go home from surgery with a catheter, which is then later removed in the office. The risk of long-term need for a catheter is very low. There is also a risk of worsening of overactive bladder.     Prolapse (with or without  mesh): Risk factors for surgical failure  include things that put pressure on your pelvis and the surgical repair, including obesity, chronic cough, and heavy lifting or straining (including lifting children or adults, straining on the toilet, or lifting heavy objects such as furniture or anything weighing >25 lbs. Risks of recurrence is 20-30% with vaginal native tissue repair and a less than 10% with sacrocolpopexy with mesh.    We discussed consent for blood products. Risks for blood transfusion include allergic reactions, other reactions that can affect different body organs and managed accordingly, transmission of infectious diseases such as HIV or Hepatitis. However, the blood is screened. Patient consents for blood products.  Pre-operative instructions:  She was instructed to not take Aspirin /NSAIDs x 7days prior to surgery.  Antibiotic prophylaxis was ordered as indicated.  Catheter use: Patient will go home with foley if needed after post-operative voiding trial.  Post-operative instructions:  She was provided with specific post-operative instructions, including precautions and signs/symptoms for which we would recommend contacting us , in addition to daytime and after-hours contact phone numbers. This was provided on a handout.   Post-operative medications: Prescriptions for motrin , tylenol , miralax , and oxycodone  were sent to her pharmacy. Discussed using  ibuprofen  and tylenol  on a schedule to limit use of narcotics.   Laboratory testing:  We will check labs: no labs required  Preoperative clearance:  She does not require surgical clearance.    Post-operative follow-up:  A post-operative appointment will be made for 6 weeks from the date of surgery. If she needs a post-operative nurse visit for a voiding trial, that will be set up after she leaves the hospital.    Patient will call the clinic or use MyChart should anything change or any new issues arise.   Kristin LOISE Caper,  MD   Time spent: I spent 25 minutes dedicated to the care of this patient on the date of this encounter to include pre-visit review of records, face-to-face time with the patient and post visit documentation and ordering medication/ testing.      [1]  Social History Tobacco Use   Smoking status: Never   Smokeless tobacco: Never  Vaping Use   Vaping status: Never Used  Substance Use Topics   Alcohol use: No   Drug use: No  [2]  Current Outpatient Medications:    acetaminophen  (TYLENOL ) 500 MG tablet, Take 1 tablet (500 mg total) by mouth every 6 (six) hours as needed (pain)., Disp: 30 tablet, Rfl: 0   atenolol (TENORMIN) 25 MG tablet, Take 25 mg by mouth daily. , Disp: , Rfl: 2   cholecalciferol (VITAMIN D3) 25 MCG (1000 UT) tablet, Take 1,000 Units by mouth daily., Disp: , Rfl:    Continuous Glucose Receiver (FREESTYLE LIBRE 3 READER) DEVI, See admin instructions., Disp: , Rfl:    Continuous Glucose Sensor (FREESTYLE LIBRE 3 PLUS SENSOR) MISC, CHANGE SENSOR EVERY 15 DAYS.DX: E11.65, Disp: 2 each, Rfl: 5   DENTA 5000 PLUS 1.1 % CREA dental cream, Take by mouth at bedtime., Disp: , Rfl:    DENTAGEL 1.1 % GEL dental gel, Place onto teeth daily., Disp: , Rfl:    esomeprazole  (NEXIUM ) 40 MG capsule, Take 1 capsule (40 mg total) by mouth daily., Disp: 90 capsule, Rfl: 4   [START ON 06/12/2024] estradiol  (ESTRACE ) 0.01 % CREA vaginal cream, Place 0.5 g vaginally 2 (two) times a week. Place 0.5g nightly for two weeks then twice a week after, Disp: 42.5 g, Rfl: 11   ibuprofen  (ADVIL ) 600 MG tablet, Take 1 tablet (600 mg total) by mouth every 6 (six) hours as needed., Disp: 30 tablet, Rfl: 0   isosorbide mononitrate (IMDUR) 60 MG 24 hr tablet, Take 60 mg by mouth daily., Disp: , Rfl:    nitroGLYCERIN (NITROSTAT) 0.4 MG SL tablet, See admin instructions., Disp: , Rfl:    oxyCODONE  (OXY IR/ROXICODONE ) 5 MG immediate release tablet, Take 1 tablet (5 mg total) by mouth every 4 (four) hours as needed  for severe pain (pain score 7-10)., Disp: 15 tablet, Rfl: 0   polyethylene glycol powder (GLYCOLAX /MIRALAX ) 17 GM/SCOOP powder, Take 17 g by mouth daily. Drink 17g (1 scoop) dissolved in water per day., Disp: 255 g, Rfl: 0   rosuvastatin (CRESTOR) 5 MG tablet, Take 5 mg by mouth daily., Disp: , Rfl:    Semaglutide ,0.25 or 0.5MG /DOS, (OZEMPIC , 0.25 OR 0.5 MG/DOSE,) 2 MG/3ML SOPN, Inject 0.25 mg into the skin every 7 (seven) days for 28 days, THEN 0.5 mg every 7 (seven) days., Disp: 9 mL, Rfl: 3   traZODone  (DESYREL ) 100 MG tablet, Take 0.5-1 tablets (50-100 mg total) by mouth at bedtime as needed for sleep., Disp: 90 tablet, Rfl: 3   vitamin C (ASCORBIC  ACID) 500 MG tablet, Take 500 mg by mouth daily., Disp: , Rfl:

## 2024-06-11 NOTE — Progress Notes (Signed)
 [facility] Return Visit  Subjective Chief Complaint: [name] presents return visit  History of Present Illness: [name] is a 69 y.o. female with Stage II POP. She is scheduled to undergo Exam under anesthesia, Anterior repair, sacrospinous fixation, cystoscopy, possible posterior repair on 06/17/24.  Her symptoms include vaginal bulge and incomplete bladder emtpying, and she was was found to have Stage II anterior, Stage 0 posterior, Stage I apical prolapse.   Urodynamic Impression:  1. Sensation was reduced; capacity was increased 2. Stress Incontinence was not demonstrated at normal pressures; 3. 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 4. Emptying was normal with a normal PVR, a sustained detrusor contraction present,  abdominal straining not present, normal urethral sphincter activity on EMG.  Past Medical History:  Diagnosis Date   Anxiety    Arthritis    Diabetes mellitus without complication (HCC)    GERD (gastroesophageal reflux disease)    Hyperlipidemia    Hypertension      Past Surgical History:  Procedure Laterality Date   ABDOMINAL HYSTERECTOMY     CERVICAL FUSION     KNEE ARTHROSCOPY Left    LUMBAR FUSION     skin cancer removal  09/25/2019   right thigh    is allergic to gabapentin .   Family History  Problem Relation Age of Onset   Congestive Heart Failure Mother    Arthritis Mother    Lung cancer Father    Bone cancer Father    Arthritis/Rheumatoid Maternal Grandmother    Skin cancer Paternal Grandfather    Uterine cancer Neg Hx    Bladder Cancer Neg Hx    Renal cancer Neg Hx     Social History[1]   Review of Systems was negative for a full 10 system review except as noted in the History of Present Illness.  Current Medications[2]   Objective There were no vitals filed for this visit.  Gen: NAD CV: S1 S2 RRR Lungs: Clear to auscultation bilaterally Abd: soft, nontender   Previous Pelvic Exam  showed: POP-Q   0.5                                            Aa   0.5                                           Ba   -5                                              C    3                                            Gh   4                                            Pb   7.5  tvl    -2                                            Ap   -2                                            Bp                                                  D         Assessment/ Plan  Assessment: The patient is a 69 y.o. year old scheduled to undergo Exam under anesthesia, Anterior repair, sacrospinous fixation, cystoscopy, possible posterior repair.   Plan: General Surgical Consent: The patient has previously been counseled on alternative treatments, and the decision by the patient and provider was to proceed with the procedure listed above.  For all procedures, there are risks of bleeding, infection, damage to surrounding organs including but not limited to bowel, bladder, blood vessels, ureters and nerves, and need for further surgery if an injury were to occur. These risks are all low with minimally invasive surgery.   There are risks of numbness and weakness at any body site or buttock/rectal pain.  It is possible that baseline pain can be worsened by surgery, either with or without mesh. If surgery is vaginal, there is also a low risk of possible conversion to laparoscopy or open abdominal incision where indicated. Very rare risks include blood transfusion, blood clot, heart attack, pneumonia, or death.   There is also a risk of short-term postoperative urinary retention with need to use a catheter. About half of patients need to go home from surgery with a catheter, which is then later removed in the office. The risk of long-term need for a catheter is very low. There is also a risk of worsening of overactive bladder.     Prolapse (with or without  mesh): Risk factors for surgical failure  include things that put pressure on your pelvis and the surgical repair, including obesity, chronic cough, and heavy lifting or straining (including lifting children or adults, straining on the toilet, or lifting heavy objects such as furniture or anything weighing >25 lbs. Risks of recurrence is 20-30% with vaginal native tissue repair and a less than 10% with sacrocolpopexy with mesh.    We discussed consent for blood products. Risks for blood transfusion include allergic reactions, other reactions that can affect different body organs and managed accordingly, transmission of infectious diseases such as HIV or Hepatitis. However, the blood is screened. Patient consents for blood products.  Pre-operative instructions:  She was instructed to not take Aspirin /NSAIDs x 7days prior to surgery.  Antibiotic prophylaxis was ordered as indicated.  Catheter use: Patient will go home with foley if needed after post-operative voiding trial.  Post-operative instructions:  She was provided with specific post-operative instructions, including precautions and signs/symptoms for which we would recommend contacting us , in addition to daytime and after-hours contact phone numbers. This was provided on a handout.   Post-operative medications: Prescriptions for motrin , tylenol , miralax , and oxycodone  were sent to her pharmacy. Discussed using  ibuprofen  and tylenol  on a schedule to limit use of narcotics.   Laboratory testing:  We will check labs: no labs required  Preoperative clearance:  She does not require surgical clearance.    Post-operative follow-up:  A post-operative appointment will be made for 6 weeks from the date of surgery. If she needs a post-operative nurse visit for a voiding trial, that will be set up after she leaves the hospital.    Patient will call the clinic or use MyChart should anything change or any new issues arise.   [name],  MD   Time spent: I spent 25 minutes dedicated to the care of this patient on the date of this encounter to include pre-visit review of records, face-to-face time with the patient and post visit documentation and ordering medication/ testing.      [1]  Social History Tobacco Use   Smoking status: Never   Smokeless tobacco: Never  Vaping Use   Vaping status: Never Used  Substance Use Topics   Alcohol use: No   Drug use: No  [2]  Current Outpatient Medications:    acetaminophen  (TYLENOL ) 500 MG tablet, Take 1 tablet (500 mg total) by mouth every 6 (six) hours as needed (pain)., Disp: 30 tablet, Rfl: 0   atenolol (TENORMIN) 25 MG tablet, Take 25 mg by mouth daily. , Disp: , Rfl: 2   cholecalciferol (VITAMIN D3) 25 MCG (1000 UT) tablet, Take 1,000 Units by mouth daily., Disp: , Rfl:    Continuous Glucose Receiver (FREESTYLE LIBRE 3 READER) DEVI, See admin instructions., Disp: , Rfl:    Continuous Glucose Sensor (FREESTYLE LIBRE 3 PLUS SENSOR) MISC, CHANGE SENSOR EVERY 15 DAYS.DX: E11.65, Disp: 2 each, Rfl: 5   DENTA 5000 PLUS 1.1 % CREA dental cream, Take by mouth at bedtime., Disp: , Rfl:    DENTAGEL 1.1 % GEL dental gel, Place onto teeth daily., Disp: , Rfl:    esomeprazole  (NEXIUM ) 40 MG capsule, Take 1 capsule (40 mg total) by mouth daily., Disp: 90 capsule, Rfl: 4   [START ON 06/12/2024] estradiol  (ESTRACE ) 0.01 % CREA vaginal cream, Place 0.5 g vaginally 2 (two) times a week. Place 0.5g nightly for two weeks then twice a week after, Disp: 42.5 g, Rfl: 11   ibuprofen  (ADVIL ) 600 MG tablet, Take 1 tablet (600 mg total) by mouth every 6 (six) hours as needed., Disp: 30 tablet, Rfl: 0   isosorbide mononitrate (IMDUR) 60 MG 24 hr tablet, Take 60 mg by mouth daily., Disp: , Rfl:    nitroGLYCERIN (NITROSTAT) 0.4 MG SL tablet, See admin instructions., Disp: , Rfl:    oxyCODONE  (OXY IR/ROXICODONE ) 5 MG immediate release tablet, Take 1 tablet (5 mg total) by mouth every 4 (four) hours as needed  for severe pain (pain score 7-10)., Disp: 15 tablet, Rfl: 0   polyethylene glycol powder (GLYCOLAX /MIRALAX ) 17 GM/SCOOP powder, Take 17 g by mouth daily. Drink 17g (1 scoop) dissolved in water per day., Disp: 255 g, Rfl: 0   rosuvastatin (CRESTOR) 5 MG tablet, Take 5 mg by mouth daily., Disp: , Rfl:    Semaglutide ,0.25 or 0.5MG /DOS, (OZEMPIC , 0.25 OR 0.5 MG/DOSE,) 2 MG/3ML SOPN, Inject 0.25 mg into the skin every 7 (seven) days for 28 days, THEN 0.5 mg every 7 (seven) days., Disp: 9 mL, Rfl: 3   traZODone  (DESYREL ) 100 MG tablet, Take 0.5-1 tablets (50-100 mg total) by mouth at bedtime as needed for sleep., Disp: 90 tablet, Rfl: 3   vitamin C (ASCORBIC  ACID) 500 MG tablet, Take 500 mg by mouth daily., Disp: , Rfl:

## 2024-06-11 NOTE — H&P (Signed)
 [facility] Pre op H&P  Subjective Chief Complaint: [name] presents return visit  History of Present Illness: [name] is a 69 y.o. female with Stage II POP. She is scheduled to undergo Exam under anesthesia, Anterior repair, sacrospinous fixation, cystoscopy, possible posterior repair on 06/17/24.  Her symptoms include vaginal bulge and incomplete bladder emtpying, and she was was found to have Stage II anterior, Stage 0 posterior, Stage I apical prolapse.   Urodynamic Impression:  1. Sensation was reduced; capacity was increased 2. Stress Incontinence was not demonstrated at normal pressures; 3. 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 4. Emptying was normal with a normal PVR, a sustained detrusor contraction present,  abdominal straining not present, normal urethral sphincter activity on EMG.  Past Medical History:  Diagnosis Date   Anxiety    Arthritis    Diabetes mellitus without complication (HCC)    GERD (gastroesophageal reflux disease)    Hyperlipidemia    Hypertension      Past Surgical History:  Procedure Laterality Date   ABDOMINAL HYSTERECTOMY     CERVICAL FUSION     KNEE ARTHROSCOPY Left    LUMBAR FUSION     skin cancer removal  09/25/2019   right thigh    is allergic to gabapentin .   Family History  Problem Relation Age of Onset   Congestive Heart Failure Mother    Arthritis Mother    Lung cancer Father    Bone cancer Father    Arthritis/Rheumatoid Maternal Grandmother    Skin cancer Paternal Grandfather    Uterine cancer Neg Hx    Bladder Cancer Neg Hx    Renal cancer Neg Hx     Social History[1]   Review of Systems was negative for a full 10 system review except as noted in the History of Present Illness.  Current Medications[2]   Objective There were no vitals filed for this visit.  Gen: NAD CV: S1 S2 RRR Lungs: Clear to auscultation bilaterally Abd: soft, nontender   Previous Pelvic Exam  showed: POP-Q   0.5                                            Aa   0.5                                           Ba   -5                                              C    3                                            Gh   4                                            Pb   7.5  tvl    -2                                            Ap   -2                                            Bp                                                  D         Assessment/ Plan  The patient is a 69 y.o. year old with stage II POP scheduled to undergo Exam under anesthesia, Anterior repair, sacrospinous fixation, cystoscopy, possible posterior repair.     [name], MD        [1]  Social History Tobacco Use   Smoking status: Never   Smokeless tobacco: Never  Vaping Use   Vaping status: Never Used  Substance Use Topics   Alcohol use: No   Drug use: No  [2] No current facility-administered medications for this encounter.  Current Outpatient Medications:    acetaminophen  (TYLENOL ) 500 MG tablet, Take 1 tablet (500 mg total) by mouth every 6 (six) hours as needed (pain)., Disp: 30 tablet, Rfl: 0   atenolol (TENORMIN) 25 MG tablet, Take 25 mg by mouth daily. , Disp: , Rfl: 2   cholecalciferol (VITAMIN D3) 25 MCG (1000 UT) tablet, Take 1,000 Units by mouth daily., Disp: , Rfl:    Continuous Glucose Receiver (FREESTYLE LIBRE 3 READER) DEVI, See admin instructions., Disp: , Rfl:    Continuous Glucose Sensor (FREESTYLE LIBRE 3 PLUS SENSOR) MISC, CHANGE SENSOR EVERY 15 DAYS.DX: E11.65, Disp: 2 each, Rfl: 5   DENTA 5000 PLUS 1.1 % CREA dental cream, Take by mouth at bedtime., Disp: , Rfl:    DENTAGEL 1.1 % GEL dental gel, Place onto teeth daily., Disp: , Rfl:    esomeprazole  (NEXIUM ) 40 MG capsule, Take 1 capsule (40 mg total) by mouth daily., Disp: 90 capsule, Rfl: 4   [START ON 06/12/2024] estradiol  (ESTRACE ) 0.01 % CREA vaginal cream,  Place 0.5 g vaginally 2 (two) times a week. Place 0.5g nightly for two weeks then twice a week after, Disp: 42.5 g, Rfl: 11   ibuprofen  (ADVIL ) 600 MG tablet, Take 1 tablet (600 mg total) by mouth every 6 (six) hours as needed., Disp: 30 tablet, Rfl: 0   isosorbide mononitrate (IMDUR) 60 MG 24 hr tablet, Take 60 mg by mouth daily., Disp: , Rfl:    nitroGLYCERIN (NITROSTAT) 0.4 MG SL tablet, See admin instructions., Disp: , Rfl:    oxyCODONE  (OXY IR/ROXICODONE ) 5 MG immediate release tablet, Take 1 tablet (5 mg total) by mouth every 4 (four) hours as needed for severe pain (pain score 7-10)., Disp: 15 tablet, Rfl: 0   polyethylene glycol powder (GLYCOLAX /MIRALAX ) 17 GM/SCOOP powder, Take 17 g by mouth daily. Drink 17g (1 scoop) dissolved in water per day., Disp: 255 g, Rfl: 0   rosuvastatin (CRESTOR) 5 MG tablet, Take 5 mg by mouth daily., Disp: , Rfl:    Semaglutide ,0.25 or 0.5MG /DOS, (OZEMPIC , 0.25 OR 0.5  MG/DOSE,) 2 MG/3ML SOPN, Inject 0.25 mg into the skin every 7 (seven) days for 28 days, THEN 0.5 mg every 7 (seven) days., Disp: 9 mL, Rfl: 3   traZODone  (DESYREL ) 100 MG tablet, Take 0.5-1 tablets (50-100 mg total) by mouth at bedtime as needed for sleep., Disp: 90 tablet, Rfl: 3   vitamin C (ASCORBIC ACID) 500 MG tablet, Take 500 mg by mouth daily., Disp: , Rfl:

## 2024-06-17 ENCOUNTER — Ambulatory Visit (HOSPITAL_COMMUNITY)
Admission: RE | Admit: 2024-06-17 | Discharge: 2024-06-17 | Disposition: A | Discharge status: Home / Self Care | Attending: Obstetrics and Gynecology | Admitting: Obstetrics and Gynecology

## 2024-06-17 ENCOUNTER — Other Ambulatory Visit: Payer: Self-pay

## 2024-06-17 ENCOUNTER — Ambulatory Visit (HOSPITAL_COMMUNITY): Admitting: Anesthesiology

## 2024-06-17 ENCOUNTER — Encounter (HOSPITAL_COMMUNITY)
Admission: RE | Disposition: A | Discharge status: Home / Self Care | Payer: Self-pay | Source: Home / Self Care | Attending: Obstetrics and Gynecology

## 2024-06-17 ENCOUNTER — Encounter (HOSPITAL_COMMUNITY): Payer: Self-pay | Admitting: Obstetrics and Gynecology

## 2024-06-17 ENCOUNTER — Telehealth: Payer: Self-pay | Admitting: Obstetrics and Gynecology

## 2024-06-17 DIAGNOSIS — E119 Type 2 diabetes mellitus without complications: Secondary | ICD-10-CM | POA: Insufficient documentation

## 2024-06-17 DIAGNOSIS — K219 Gastro-esophageal reflux disease without esophagitis: Secondary | ICD-10-CM | POA: Insufficient documentation

## 2024-06-17 DIAGNOSIS — N993 Prolapse of vaginal vault after hysterectomy: Secondary | ICD-10-CM | POA: Diagnosis present

## 2024-06-17 DIAGNOSIS — M1990 Unspecified osteoarthritis, unspecified site: Secondary | ICD-10-CM | POA: Diagnosis not present

## 2024-06-17 DIAGNOSIS — I10 Essential (primary) hypertension: Secondary | ICD-10-CM | POA: Diagnosis not present

## 2024-06-17 DIAGNOSIS — I2510 Atherosclerotic heart disease of native coronary artery without angina pectoris: Secondary | ICD-10-CM | POA: Insufficient documentation

## 2024-06-17 DIAGNOSIS — I25118 Atherosclerotic heart disease of native coronary artery with other forms of angina pectoris: Secondary | ICD-10-CM | POA: Diagnosis not present

## 2024-06-17 DIAGNOSIS — I152 Hypertension secondary to endocrine disorders: Secondary | ICD-10-CM

## 2024-06-17 HISTORY — PX: CYSTOCELE REPAIR: SHX163

## 2024-06-17 HISTORY — PX: CYSTOSCOPY: SHX5120

## 2024-06-17 HISTORY — PX: PERINEOPLASTY: SHX2218

## 2024-06-17 HISTORY — PX: VAGINAL PROLAPSE REPAIR: SHX830

## 2024-06-17 LAB — BASIC METABOLIC PANEL WITH GFR
Anion gap: 11 (ref 5–15)
BUN: 9 mg/dL (ref 8–23)
CO2: 21 mmol/L — ABNORMAL LOW (ref 22–32)
Calcium: 8.5 mg/dL — ABNORMAL LOW (ref 8.9–10.3)
Chloride: 106 mmol/L (ref 98–111)
Creatinine, Ser: 0.6 mg/dL (ref 0.44–1.00)
GFR, Estimated: >60 mL/min
Glucose, Bld: 86 mg/dL (ref 70–99)
Potassium: 4 mmol/L (ref 3.5–5.1)
Sodium: 139 mmol/L (ref 135–145)

## 2024-06-17 LAB — HEMOGLOBIN A1C
Hgb A1c MFr Bld: 5.6 % (ref 4.8–5.6)
Mean Plasma Glucose: 114.02 mg/dL

## 2024-06-17 LAB — GLUCOSE, CAPILLARY
Glucose-Capillary: 102 mg/dL — ABNORMAL HIGH (ref 70–99)
Glucose-Capillary: 109 mg/dL — ABNORMAL HIGH (ref 70–99)

## 2024-06-17 MED ORDER — PROPOFOL 10 MG/ML IV BOLUS
INTRAVENOUS | Status: AC
  Filled 2024-06-17: qty 20

## 2024-06-17 MED ORDER — PROPOFOL 1000 MG/100ML IV EMUL
INTRAVENOUS | Status: AC
  Filled 2024-06-17: qty 100

## 2024-06-17 MED ORDER — CHLORHEXIDINE GLUCONATE 0.12 % MT SOLN
15.0000 mL | Freq: Once | OROMUCOSAL | Status: AC
  Administered 2024-06-17: 15 mL via OROMUCOSAL

## 2024-06-17 MED ORDER — LIDOCAINE-EPINEPHRINE 1 %-1:100000 IJ SOLN
INTRAMUSCULAR | Status: AC
  Filled 2024-06-17: qty 3

## 2024-06-17 MED ORDER — ACETAMINOPHEN 500 MG PO TABS
1000.0000 mg | ORAL_TABLET | ORAL | Status: AC
  Administered 2024-06-17: 1000 mg via ORAL

## 2024-06-17 MED ORDER — ONDANSETRON HCL 4 MG/2ML IJ SOLN
INTRAMUSCULAR | Status: DC | PRN
  Administered 2024-06-17: 4 mg via INTRAVENOUS

## 2024-06-17 MED ORDER — INSULIN ASPART 100 UNIT/ML IJ SOLN: 0.0000 [IU] | INTRAMUSCULAR | Status: DC | PRN

## 2024-06-17 MED ORDER — LIDOCAINE-EPINEPHRINE 1 %-1:100000 IJ SOLN
INTRAMUSCULAR | Status: DC | PRN
  Administered 2024-06-17: 20 mL

## 2024-06-17 MED ORDER — FENTANYL CITRATE (PF) 100 MCG/2ML IJ SOLN
INTRAMUSCULAR | Status: AC
  Filled 2024-06-17: qty 2

## 2024-06-17 MED ORDER — AMISULPRIDE (ANTIEMETIC) 5 MG/2ML IV SOLN: 10.0000 mg | Freq: Once | INTRAVENOUS | Status: DC | PRN

## 2024-06-17 MED ORDER — DEXAMETHASONE SOD PHOSPHATE PF 10 MG/ML IJ SOLN
INTRAMUSCULAR | Status: DC | PRN
  Administered 2024-06-17: 5 mg via INTRAVENOUS

## 2024-06-17 MED ORDER — OXYCODONE HCL 5 MG/5ML PO SOLN: 5.0000 mg | Freq: Once | ORAL | Status: DC | PRN

## 2024-06-17 MED ORDER — PHENAZOPYRIDINE HCL 100 MG PO TABS
200.0000 mg | ORAL_TABLET | ORAL | Status: AC
  Administered 2024-06-17: 200 mg via ORAL

## 2024-06-17 MED ORDER — ONDANSETRON HCL 4 MG/2ML IJ SOLN
INTRAMUSCULAR | Status: AC
  Filled 2024-06-17: qty 2

## 2024-06-17 MED ORDER — FENTANYL CITRATE (PF) 250 MCG/5ML IJ SOLN
INTRAMUSCULAR | Status: AC
  Filled 2024-06-17: qty 5

## 2024-06-17 MED ORDER — SUGAMMADEX SODIUM 200 MG/2ML IV SOLN
INTRAVENOUS | Status: DC | PRN
  Administered 2024-06-17: 200 mg via INTRAVENOUS

## 2024-06-17 MED ORDER — LIDOCAINE 2% (20 MG/ML) 5 ML SYRINGE
INTRAMUSCULAR | Status: AC
  Filled 2024-06-17: qty 5

## 2024-06-17 MED ORDER — FENTANYL CITRATE (PF) 100 MCG/2ML IJ SOLN
INTRAMUSCULAR | Status: DC | PRN
  Administered 2024-06-17 (×7): 50 ug via INTRAVENOUS

## 2024-06-17 MED ORDER — PROPOFOL 10 MG/ML IV BOLUS
INTRAVENOUS | Status: DC | PRN
  Administered 2024-06-17: 10 mg via INTRAVENOUS
  Administered 2024-06-17: 100 mg via INTRAVENOUS
  Administered 2024-06-17: 80 ug/kg/min via INTRAVENOUS

## 2024-06-17 MED ORDER — HYDROMORPHONE HCL 1 MG/ML IJ SOLN
INTRAMUSCULAR | Status: AC
  Filled 2024-06-17: qty 1

## 2024-06-17 MED ORDER — DEXAMETHASONE SOD PHOSPHATE PF 10 MG/ML IJ SOLN
INTRAMUSCULAR | Status: AC
  Filled 2024-06-17: qty 1

## 2024-06-17 MED ORDER — ROCURONIUM BROMIDE 100 MG/10ML IV SOLN
INTRAVENOUS | Status: DC | PRN
  Administered 2024-06-17: 50 mg via INTRAVENOUS

## 2024-06-17 MED ORDER — LACTATED RINGERS IV SOLN: INTRAVENOUS | Status: DC

## 2024-06-17 MED ORDER — LIDOCAINE 2% (20 MG/ML) 5 ML SYRINGE
INTRAMUSCULAR | Status: DC | PRN
  Administered 2024-06-17: 60 mg via INTRAVENOUS

## 2024-06-17 MED ORDER — OXYCODONE HCL 5 MG PO TABS: 5.0000 mg | ORAL_TABLET | Freq: Once | ORAL | Status: DC | PRN

## 2024-06-17 MED ORDER — 0.9 % SODIUM CHLORIDE (POUR BTL) OPTIME
TOPICAL | Status: DC | PRN
  Administered 2024-06-17: 1000 mL

## 2024-06-17 MED ORDER — PHENYLEPHRINE 80 MCG/ML (10ML) SYRINGE FOR IV PUSH (FOR BLOOD PRESSURE SUPPORT)
PREFILLED_SYRINGE | INTRAVENOUS | Status: DC | PRN
  Administered 2024-06-17: 320 ug via INTRAVENOUS

## 2024-06-17 MED ORDER — CEFAZOLIN SODIUM-DEXTROSE 2-4 GM/100ML-% IV SOLN
2.0000 g | INTRAVENOUS | Status: AC
  Administered 2024-06-17: 2 g via INTRAVENOUS

## 2024-06-17 MED ORDER — ACETAMINOPHEN 500 MG PO TABS
ORAL_TABLET | ORAL | Status: AC
  Filled 2024-06-17: qty 2

## 2024-06-17 MED ORDER — PHENAZOPYRIDINE HCL 100 MG PO TABS
ORAL_TABLET | ORAL | Status: AC
  Filled 2024-06-17: qty 2

## 2024-06-17 MED ORDER — SODIUM CHLORIDE 0.9 % IR SOLN
Status: DC | PRN
  Administered 2024-06-17: 1000 mL via INTRAVESICAL

## 2024-06-17 MED ORDER — ROCURONIUM BROMIDE 10 MG/ML (PF) SYRINGE
PREFILLED_SYRINGE | INTRAVENOUS | Status: AC
  Filled 2024-06-17: qty 10

## 2024-06-17 MED ORDER — MIDAZOLAM HCL 5 MG/5ML IJ SOLN
INTRAMUSCULAR | Status: DC | PRN
  Administered 2024-06-17 (×2): 1 mg via INTRAVENOUS

## 2024-06-17 MED ORDER — HYDROMORPHONE HCL 1 MG/ML IJ SOLN
0.2500 mg | INTRAMUSCULAR | Status: DC | PRN
  Administered 2024-06-17: 0.25 mg via INTRAVENOUS
  Administered 2024-06-17: 0.5 mg via INTRAVENOUS
  Administered 2024-06-17: 0.25 mg via INTRAVENOUS

## 2024-06-17 MED ORDER — KETOROLAC TROMETHAMINE 30 MG/ML IJ SOLN: 15.0000 mg | Freq: Once | INTRAMUSCULAR | Status: DC | PRN

## 2024-06-17 MED ORDER — CEFAZOLIN SODIUM-DEXTROSE 2-4 GM/100ML-% IV SOLN
INTRAVENOUS | Status: AC
  Filled 2024-06-17: qty 100

## 2024-06-17 MED ORDER — MIDAZOLAM HCL 2 MG/2ML IJ SOLN
INTRAMUSCULAR | Status: AC
  Filled 2024-06-17: qty 2

## 2024-06-17 MED ORDER — ORAL CARE MOUTH RINSE: 15.0000 mL | Freq: Once | OROMUCOSAL | Status: AC

## 2024-06-17 MED ORDER — CHLORHEXIDINE GLUCONATE 0.12 % MT SOLN
OROMUCOSAL | Status: AC
  Filled 2024-06-17: qty 15

## 2024-06-17 MED ORDER — ONDANSETRON HCL 4 MG/2ML IJ SOLN: 4.0000 mg | Freq: Once | INTRAMUSCULAR | Status: DC | PRN

## 2024-06-17 NOTE — Anesthesia Preprocedure Evaluation (Addendum)
"                                    Anesthesia Evaluation  Patient identified by MRN, date of birth, ID band Patient awake    Reviewed: Allergy & Precautions, NPO status , Patient's Chart, lab work & pertinent test results, reviewed documented beta blocker date and time   Airway Mallampati: II  TM Distance: >3 FB Neck ROM: Full    Dental no notable dental hx. (+) Teeth Intact, Dental Advisory Given   Pulmonary neg pulmonary ROS   Pulmonary exam normal breath sounds clear to auscultation       Cardiovascular hypertension, Pt. on medications and Pt. on home beta blockers + angina  + CAD  Normal cardiovascular exam Rhythm:Regular Rate:Normal  MYOCARDIAL IMAGING WITH SPECT (REST AND PHARMACOLOGIC-STRESS) 2019 (for CP and SOB at the time): 1. No reversible ischemia or infarction. 2. Normal left ventricular wall motion. 3. Left ventricular ejection fraction 84% 4. Non invasive risk stratification*: Low risk    Neuro/Psych  Headaches PSYCHIATRIC DISORDERS Anxiety        GI/Hepatic Neg liver ROS,GERD  Medicated and Controlled,,  Endo/Other  diabetes, Well Controlled, Type 2  Last A1c 5.3 (08/2023)  Renal/GU negative Renal ROS  negative genitourinary   Musculoskeletal  (+) Arthritis , Osteoarthritis,    Abdominal   Peds  Hematology negative hematology ROS (+)   Anesthesia Other Findings Ozempic   Reproductive/Obstetrics prolapse                              Anesthesia Physical Anesthesia Plan  ASA: 3  Anesthesia Plan: General   Post-op Pain Management: Tylenol  PO (pre-op)*   Induction: Intravenous  PONV Risk Score and Plan: 3 and Ondansetron , Dexamethasone , Treatment may vary due to age or medical condition, Midazolam  and TIVA  Airway Management Planned: Oral ETT and Video Laryngoscope Planned  Additional Equipment: None  Intra-op Plan:   Post-operative Plan: Extubation in OR  Informed Consent:   Plan Discussed  with:   Anesthesia Plan Comments:          Anesthesia Quick Evaluation  "

## 2024-06-17 NOTE — Transfer of Care (Signed)
 Immediate Anesthesia Transfer of Care Note  Patient: [name]  Procedure(s) Performed: COLPORRHAPHY, ANTERIOR, FOR CYSTOCELE REPAIR (Vagina ) SUSPENSION, VAGINAL VAULT/ SACROSPINOUS FIXATION (Vagina ) CYSTOSCOPY (Bladder) PERINEOPLASTY (Vagina )  Patient Location: PACU  Anesthesia Type:General  Level of Consciousness: awake, alert , oriented, and patient cooperative  Airway & Oxygen Therapy: Patient Spontanous Breathing and Patient connected to face mask oxygen  Post-op Assessment: Report given to RN and Post -op Vital signs reviewed and stable  Post vital signs: Reviewed and stable  Last Vitals:  Vitals Value Taken Time  BP 91/75 06/17/24 13:56  Temp    Pulse 78 06/17/24 14:01  Resp 17 06/17/24 14:01  SpO2 100 % 06/17/24 14:01  Vitals shown include unfiled device data.  Last Pain:  Vitals:   06/17/24 1012  TempSrc: Oral  PainSc: 4       Patients Stated Pain Goal: 5 (06/17/24 1012)  Complications: There were no known notable events for this encounter.

## 2024-06-17 NOTE — Discharge Instructions (Signed)

## 2024-06-17 NOTE — Interval H&P Note (Signed)
 History and Physical Interval Note:  06/17/2024 11:30 AM  [name]  has presented today for surgery, with the diagnosis of Vaginal vault prolapse after hysterectomy.  The various methods of treatment have been discussed with the patient and family. After consideration of risks, benefits and other options for treatment, the patient has consented to  Procedures with comments: COLPORRHAPHY, ANTERIOR, FOR CYSTOCELE REPAIR (N/A) SUSPENSION, VAGINAL VAULT/ SACROSPINOUS FIXATION (N/A) - sacrospinous fixation CYSTOSCOPY (N/A) COLPORRHAPHY, POSTERIOR, FOR RECTOCELE REPAIR (N/A) ANTERIOR (CYSTOCELE) AND POSTERIOR REPAIR (RECTOCELE)  AND SACROSPINOUS FIXATION as a surgical intervention.  The patient's history has been reviewed, patient examined, no change in status, stable for surgery.  I have reviewed the patient's chart and labs.  Questions were answered to the patient's satisfaction.     [name]

## 2024-06-17 NOTE — Telephone Encounter (Signed)
 [name] underwent Anterior repair, sacrospinous ligament fixation, perineorrhaphy, cystoscopy on 06/17/2024.   She failed her voiding trial.  was backfilled into the bladder She was unable to void  She was discharged with a catheter. Please call her for a routine post op check and to schedule a voiding trial. Thanks!  [name], MD

## 2024-06-17 NOTE — Op Note (Signed)
 Operative Note  Preoperative Diagnosis: anterior vaginal prolapse and vaginal vault prolapse after hysterectomy  Postoperative Diagnosis: same  Procedures performed:  Anterior repair, sacrospinous ligament fixation, perineorrhaphy, cystoscopy  Implants: none  Attending Surgeon: Rosaline Caper, MD  Assistant: Jorene Moats, PA  Anesthesia: General endotracheal  Findings: 1. On vaginal exam, stage II prolapse present  2. On cystoscopy, normal bladder and urethral mucosa without injury or lesion. Brisk bilateral ureteral efflux present.    Specimens: none  Estimated blood loss: 25 mL  IV fluids: 1300 mL  Urine output: 0ml  Complications: none  Procedure in Detail:  After informed consent was obtained, the patient was taken to the operating room where anesthesia was induced and found to be adequate. She was placed in dorsal lithotomy position, taking care to avoid any traction on the extremities, and then prepped and draped in the usual sterile fashion. A self-retaining lonestar retractor was placed using four elastic blue stays.  After a foley catheter was inserted into the urethra, the location of the midurethra was palpated. Two Allis clamps were along the anterior vaginal wall defect. 1% lidocaine  with epinephrine  was injected into the vaginal mucosa.  A vertical incision was made between these two Allis clamps with a 15 blade scalpel.  Allis clamps were placed along this incision and Metzenbaum scissors were used to undermine the vaginal mucosa along the incision.  The vaginal mucosa was then sharply dissected off to the vesicovaginal septum bilaterally to the level of the pubic rami.    For the sacrospinous ligament fixation (SSLF), the ischial spine was accessed on the right side via dissection with Metzenbaum scissors and blunt dissection.  The sacrospinous ligament was palpated. Two 0 PDS suture was then placed at the sacrospinous ligament two fingerbreadths medial to the  ischial spine, in order to avoid the pudendal neurovascular bundle, using a Capio needle driver.  The PDS suture was attached to the vaginal epithelium on the ipsilateral side of the vaginal apex and held. Anterior plication of the vesicovaginal septum was then performed using mattress sutures of 2-0 Vicryl. The vaginal mucosal edges were trimmed and the incision reapproximated with 2-0 Vicryl in a running fashion. The SSLF suture was then tied down with excellent support of the anterior and apical vagina.  The Foley catheter was removed.  A 70-degree cystoscope was introduced, and 360-degree inspection revealed no trauma to the bladder, with bilateral ureteral efflux.  The bladder was drained and the cystoscope was removed.  The Foley catheter was reinserted.   Attention was then turned to the perineum. Two allis clamps were placed at the introitus. The perineum was injected with 1% lidocaine  with epinephrine . A diamond shaped incision was made over the perineum and excess skin was removed. Dissection was performed with Metzenbaum scissors to separate the mucosa from the underlying tissue. The perineal body was then reapproximated with two interrupted 0-vicryl sutures. The perineal skin was then closed with a 2-0 vicryl in a subcutaneous and subcuticular fashion. Irrigation was performed and good hemostasis was noted.   The patient tolerated the procedure well.  She was awakened from anesthesia and transferred to the recovery room in stable condition. All counts were correct x 2.    [name], MD

## 2024-06-17 NOTE — Anesthesia Procedure Notes (Signed)
 Procedure Name: Intubation Date/Time: 06/17/2024 12:14 PM  Performed by: Erick Fitz, CRNAPre-anesthesia Checklist: Patient identified, Emergency Drugs available, Suction available, Patient being monitored and Timeout performed Patient Re-evaluated:Patient Re-evaluated prior to induction Oxygen Delivery Method: Circle system utilized Preoxygenation: Pre-oxygenation with 100% oxygen Induction Type: IV induction Ventilation: Mask ventilation without difficulty Laryngoscope Size: Glidescope and 3 (S3 GlideScope blade) Grade View: Grade I Tube type: Oral Tube size: 7.0 mm Number of attempts: 1 Airway Equipment and Method: Stylet Placement Confirmation: ETT inserted through vocal cords under direct vision, positive ETCO2, CO2 detector and breath sounds checked- equal and bilateral Secured at: 22 cm Tube secured with: Tape (secured with 1/2 white silk tape) Dental Injury: Teeth and Oropharynx as per pre-operative assessment

## 2024-06-18 ENCOUNTER — Encounter (HOSPITAL_COMMUNITY): Payer: Self-pay | Admitting: Obstetrics and Gynecology

## 2024-06-18 NOTE — Anesthesia Postprocedure Evaluation (Signed)
"   Anesthesia Post Note  Patient: [name]  Procedure(s) Performed: COLPORRHAPHY, ANTERIOR, FOR CYSTOCELE REPAIR (Vagina ) SUSPENSION, VAGINAL VAULT/ SACROSPINOUS FIXATION (Vagina ) CYSTOSCOPY (Bladder) PERINEOPLASTY (Vagina )     Patient location during evaluation: PACU Anesthesia Type: General Level of consciousness: awake and alert Pain management: pain level controlled Vital Signs Assessment: post-procedure vital signs reviewed and stable Respiratory status: spontaneous breathing, nonlabored ventilation, respiratory function stable and patient connected to nasal cannula oxygen Cardiovascular status: blood pressure returned to baseline and stable Postop Assessment: no apparent nausea or vomiting Anesthetic complications: no   There were no known notable events for this encounter.  Last Vitals:  Vitals:   06/17/24 1515 06/17/24 1530  BP: 127/69 (!) 121/98  Pulse: 80 81  Resp: 11 13  Temp:  36.5 C  SpO2: 95% 94%    Last Pain:  Vitals:   06/17/24 1512  TempSrc:   PainSc: 6    Pain Goal: Patients Stated Pain Goal: 5 (06/17/24 1012)                 [name]      "

## 2024-06-19 ENCOUNTER — Ambulatory Visit

## 2024-06-19 VITALS — BP 144/75 | HR 70

## 2024-06-19 DIAGNOSIS — R339 Retention of urine, unspecified: Secondary | ICD-10-CM

## 2024-06-19 NOTE — Telephone Encounter (Signed)
 [name]  underwent Colporrhaphy, Anterior, For Cystocele Repair, Suspension, Vaginal Vault/ Sacrospinous Fixation, Cystoscopy, and Perineoplasty  on 06/17/2024  with [x] Dr Marilynne [] Dr Guadlupe.  The patient reports that her pain is controlled.  She is taking [] No Medication [x] Acetaminophen  500mg  every 6 hours [x] Ibuprofen  600mg  every 6 hours or []  Prescribed Narcotic.  Her pain level is 6[] with medication [x] Without medication is.   She reports vaginal bleeding and it is light.  Advised normal.  The patient is tolerating PO fluids and solids. She has not had a bowel movement and is taking Miralax  for a bowel regimen. She is not passing gas.  She was discharged with a catheter.   []  Discharged without a catheter. [x] Discharged with a catheter, the patient is not having any concerns with her catheter.  She will return for a voiding trial. [x] Verified scheduled date and time with patient.  She does not having any additional questions.  Reviewed Post operative instructions as needed to answer additional questions.   CC'd note to patient's provider.

## 2024-06-19 NOTE — Progress Notes (Signed)
 Patient presented today for a voiding trial:  Patient had a catheter in place. Catheter was drained of remaining urine in tube.   sterile water was introduced through catheter using a syringe. Patient tolerated the procedure well.   Catheter was removed and patient was allowed to void.   Patient voided .   Patient was allowed to go home without catheter in place due to passed voiding trial.

## 2024-06-20 NOTE — Progress Notes (Addendum)
 [name]                                          MRN: [id number]   06/20/2024   The [facility] Quality Team Specialist reviewed this patient medical record for the purposes of chart review for care gap closure. The following were reviewed: chart review for care gap closure-breast cancer screening, colorectal cancer screening, and diabetic eye exam.    [facility] Quality Team

## 2024-07-09 ENCOUNTER — Telehealth: Payer: Self-pay | Admitting: Family Medicine

## 2024-07-09 ENCOUNTER — Other Ambulatory Visit (HOSPITAL_COMMUNITY): Payer: Self-pay

## 2024-07-09 DIAGNOSIS — Z7985 Long-term (current) use of injectable non-insulin antidiabetic drugs: Secondary | ICD-10-CM

## 2024-07-09 NOTE — Telephone Encounter (Unsigned)
 Copied from CRM #[id number]. Topic: Clinical - Medication Question >> Jul 09, 2024 10:32 AM [name] wrote: Reason for CRM: Patient stated she was getting Ozempic  through the patient assistance program but it ended this year and she only has like 2-3 weeks left of the medication and wanted to know will she need a PA to get it and if so can Dr.Gottschalk put a request in for her to get it before she runs out. Can you call patient once it has been placed to give an update if possible. Callback number [phone]

## 2024-07-10 ENCOUNTER — Telehealth: Payer: Self-pay | Admitting: Pharmacist

## 2024-07-10 DIAGNOSIS — E1169 Type 2 diabetes mellitus with other specified complication: Secondary | ICD-10-CM

## 2024-07-10 DIAGNOSIS — E119 Type 2 diabetes mellitus without complications: Secondary | ICD-10-CM

## 2024-07-10 MED ORDER — OZEMPIC (0.25 OR 0.5 MG/DOSE) 2 MG/3ML ~~LOC~~ SOPN: 0.5000 mg | PEN_INJECTOR | SUBCUTANEOUS | 3 refills | Status: AC

## 2024-07-10 NOTE — Telephone Encounter (Signed)
 Previously enrolled in PAP with Novo Nordisk.  Will need to explore PAP options for T2DM medications.

## 2024-07-10 NOTE — Telephone Encounter (Signed)
 Ozempic  RX called in for patient.  Please let patient know.  I placed a referral to pharmacy is she may need assistance, but this can take up to 8 weeks to apply for medication alternative to Ozemipc patient assistance.  She can also call he insurance to be put on a payment plan. Thank you!

## 2024-07-11 ENCOUNTER — Telehealth: Payer: Self-pay | Admitting: Family Medicine

## 2024-07-11 NOTE — Telephone Encounter (Unsigned)
 Copied from CRM [id number]. Topic: Clinical - Medication Question >> Jul 11, 2024  9:46 AM Carrielelia G wrote: Reason for CRM: This is Shanda from [employer]. Patient Hoar has requested a FreeStyle Libre 3+ sensor, and we need to verify whether she has hypoglycemia and if she is currently experiencing or managing this condition.

## 2024-07-11 NOTE — Telephone Encounter (Signed)
 Patient aware and verbalized understanding.

## 2024-07-28 ENCOUNTER — Encounter: Admitting: Obstetrics and Gynecology

## 2024-08-26 ENCOUNTER — Encounter: Payer: Self-pay | Admitting: Family Medicine
